# Patient Record
Sex: Female | Born: 1965 | Race: White | Hispanic: No | Marital: Married | Smoking: Former smoker
Health system: Southern US, Academic
[De-identification: ages and names within clinical notes are randomized; demographics above are authoritative.]

## PROBLEM LIST (undated history)

## (undated) ENCOUNTER — Encounter (HOSPITAL_COMMUNITY): Admission: RE | Payer: Self-pay | Source: Ambulatory Visit

## (undated) ENCOUNTER — Ambulatory Visit (HOSPITAL_COMMUNITY): Admission: RE | Payer: 59 | Source: Ambulatory Visit | Admitting: Podiatrist

## (undated) DIAGNOSIS — M109 Gout, unspecified: Secondary | ICD-10-CM

## (undated) DIAGNOSIS — R519 Headache, unspecified: Secondary | ICD-10-CM

## (undated) DIAGNOSIS — K5900 Constipation, unspecified: Secondary | ICD-10-CM

## (undated) DIAGNOSIS — Z973 Presence of spectacles and contact lenses: Secondary | ICD-10-CM

## (undated) DIAGNOSIS — Z9989 Dependence on other enabling machines and devices: Secondary | ICD-10-CM

## (undated) DIAGNOSIS — E785 Hyperlipidemia, unspecified: Secondary | ICD-10-CM

## (undated) DIAGNOSIS — M539 Dorsopathy, unspecified: Secondary | ICD-10-CM

## (undated) DIAGNOSIS — F419 Anxiety disorder, unspecified: Secondary | ICD-10-CM

## (undated) DIAGNOSIS — E079 Disorder of thyroid, unspecified: Secondary | ICD-10-CM

## (undated) DIAGNOSIS — R1310 Dysphagia, unspecified: Secondary | ICD-10-CM

## (undated) DIAGNOSIS — E039 Hypothyroidism, unspecified: Secondary | ICD-10-CM

## (undated) DIAGNOSIS — R112 Nausea with vomiting, unspecified: Secondary | ICD-10-CM

## (undated) DIAGNOSIS — I739 Peripheral vascular disease, unspecified: Secondary | ICD-10-CM

## (undated) DIAGNOSIS — M797 Fibromyalgia: Secondary | ICD-10-CM

## (undated) DIAGNOSIS — G43909 Migraine, unspecified, not intractable, without status migrainosus: Secondary | ICD-10-CM

## (undated) DIAGNOSIS — E119 Type 2 diabetes mellitus without complications: Secondary | ICD-10-CM

## (undated) DIAGNOSIS — M1990 Unspecified osteoarthritis, unspecified site: Secondary | ICD-10-CM

## (undated) DIAGNOSIS — I10 Essential (primary) hypertension: Secondary | ICD-10-CM

## (undated) DIAGNOSIS — Z9889 Other specified postprocedural states: Secondary | ICD-10-CM

## (undated) DIAGNOSIS — I729 Aneurysm of unspecified site: Secondary | ICD-10-CM

## (undated) DIAGNOSIS — K769 Liver disease, unspecified: Secondary | ICD-10-CM

## (undated) DIAGNOSIS — R6889 Other general symptoms and signs: Secondary | ICD-10-CM

## (undated) DIAGNOSIS — G629 Polyneuropathy, unspecified: Secondary | ICD-10-CM

## (undated) DIAGNOSIS — R7303 Prediabetes: Secondary | ICD-10-CM

## (undated) HISTORY — PX: HX HIP REPLACEMENT: SHX124

## (undated) HISTORY — PX: LEG SURGERY: SHX1003

## (undated) HISTORY — PX: COLONOSCOPY: SHX174

## (undated) HISTORY — PX: LAMINECTOMY: SHX219

## (undated) HISTORY — PX: ANKLE SURGERY: SHX546

## (undated) HISTORY — PX: HX UPPER ENDOSCOPY: 2100001144

## (undated) HISTORY — PX: HX FOOT SURGERY: 2100001154

## (undated) HISTORY — PX: HX BACK SURGERY: SHX140

## (undated) HISTORY — PX: HYSTEROTOMY: SHX1776

## (undated) HISTORY — PX: BLADDER SURGERY: SHX569

## (undated) HISTORY — PX: HX HYSTERECTOMY: SHX81

## (undated) SURGERY — ARTHROPLASTY HAMMERTOE CORRECTION
Anesthesia: General | Site: Foot | Laterality: Left

---

## 1995-10-02 DIAGNOSIS — G629 Polyneuropathy, unspecified: Secondary | ICD-10-CM | POA: Insufficient documentation

## 2011-01-21 DIAGNOSIS — D509 Iron deficiency anemia, unspecified: Secondary | ICD-10-CM | POA: Insufficient documentation

## 2011-01-21 DIAGNOSIS — E039 Hypothyroidism, unspecified: Secondary | ICD-10-CM | POA: Insufficient documentation

## 2011-01-21 DIAGNOSIS — I10 Essential (primary) hypertension: Secondary | ICD-10-CM | POA: Insufficient documentation

## 2012-02-17 DIAGNOSIS — L259 Unspecified contact dermatitis, unspecified cause: Secondary | ICD-10-CM | POA: Insufficient documentation

## 2013-04-25 DIAGNOSIS — M109 Gout, unspecified: Secondary | ICD-10-CM | POA: Insufficient documentation

## 2013-04-25 DIAGNOSIS — F32A Depression, unspecified: Secondary | ICD-10-CM | POA: Insufficient documentation

## 2013-04-25 HISTORY — DX: Gout, unspecified: M10.9

## 2013-05-30 DIAGNOSIS — R519 Headache, unspecified: Secondary | ICD-10-CM | POA: Insufficient documentation

## 2013-07-19 DIAGNOSIS — M546 Pain in thoracic spine: Secondary | ICD-10-CM | POA: Insufficient documentation

## 2013-10-10 DIAGNOSIS — R110 Nausea: Secondary | ICD-10-CM | POA: Insufficient documentation

## 2013-12-23 DIAGNOSIS — M4120 Other idiopathic scoliosis, site unspecified: Secondary | ICD-10-CM | POA: Insufficient documentation

## 2013-12-23 DIAGNOSIS — M25519 Pain in unspecified shoulder: Secondary | ICD-10-CM | POA: Insufficient documentation

## 2013-12-23 DIAGNOSIS — M7062 Trochanteric bursitis, left hip: Secondary | ICD-10-CM | POA: Insufficient documentation

## 2013-12-23 DIAGNOSIS — M940 Chondrocostal junction syndrome [Tietze]: Secondary | ICD-10-CM | POA: Insufficient documentation

## 2014-02-17 DIAGNOSIS — L732 Hidradenitis suppurativa: Secondary | ICD-10-CM | POA: Insufficient documentation

## 2014-03-02 DIAGNOSIS — K819 Cholecystitis, unspecified: Secondary | ICD-10-CM | POA: Insufficient documentation

## 2014-05-30 DIAGNOSIS — G5600 Carpal tunnel syndrome, unspecified upper limb: Secondary | ICD-10-CM | POA: Insufficient documentation

## 2014-08-10 ENCOUNTER — Ambulatory Visit (HOSPITAL_BASED_OUTPATIENT_CLINIC_OR_DEPARTMENT_OTHER): Payer: No Typology Code available for payment source | Admitting: ORTHOPEDIC, SPORTS MEDICINE

## 2014-08-14 ENCOUNTER — Ambulatory Visit (HOSPITAL_BASED_OUTPATIENT_CLINIC_OR_DEPARTMENT_OTHER): Payer: No Typology Code available for payment source | Admitting: ORTHOPEDIC, SPORTS MEDICINE

## 2014-08-20 DIAGNOSIS — J189 Pneumonia, unspecified organism: Secondary | ICD-10-CM | POA: Insufficient documentation

## 2014-08-20 DIAGNOSIS — J309 Allergic rhinitis, unspecified: Secondary | ICD-10-CM | POA: Insufficient documentation

## 2014-08-30 ENCOUNTER — Ambulatory Visit (HOSPITAL_BASED_OUTPATIENT_CLINIC_OR_DEPARTMENT_OTHER): Payer: No Typology Code available for payment source | Admitting: Physician Assistant

## 2014-08-30 ENCOUNTER — Ambulatory Visit
Admission: RE | Admit: 2014-08-30 | Discharge: 2014-08-30 | Disposition: A | Discharge status: Home / Self Care | Payer: No Typology Code available for payment source | Source: Ambulatory Visit | Attending: ORTHOPEDIC, SPORTS MEDICINE | Admitting: ORTHOPEDIC, SPORTS MEDICINE

## 2014-08-30 VITALS — BP 149/79 | HR 79 | Temp 98.1°F | Ht 64.0 in | Wt 186.5 lb

## 2014-08-30 DIAGNOSIS — E559 Vitamin D deficiency, unspecified: Secondary | ICD-10-CM | POA: Insufficient documentation

## 2014-08-30 DIAGNOSIS — M810 Age-related osteoporosis without current pathological fracture: Secondary | ICD-10-CM | POA: Insufficient documentation

## 2014-08-30 DIAGNOSIS — Z87891 Personal history of nicotine dependence: Secondary | ICD-10-CM | POA: Insufficient documentation

## 2014-08-30 DIAGNOSIS — E039 Hypothyroidism, unspecified: Secondary | ICD-10-CM | POA: Insufficient documentation

## 2014-08-30 DIAGNOSIS — M24511 Contracture, right shoulder: Secondary | ICD-10-CM | POA: Insufficient documentation

## 2014-08-30 DIAGNOSIS — I10 Essential (primary) hypertension: Secondary | ICD-10-CM | POA: Insufficient documentation

## 2014-08-30 DIAGNOSIS — M19011 Primary osteoarthritis, right shoulder: Secondary | ICD-10-CM

## 2014-08-30 DIAGNOSIS — R52 Pain, unspecified: Secondary | ICD-10-CM

## 2014-08-30 DIAGNOSIS — G5600 Carpal tunnel syndrome, unspecified upper limb: Secondary | ICD-10-CM | POA: Insufficient documentation

## 2014-08-30 DIAGNOSIS — M797 Fibromyalgia: Secondary | ICD-10-CM | POA: Insufficient documentation

## 2014-08-30 DIAGNOSIS — Z8669 Personal history of other diseases of the nervous system and sense organs: Secondary | ICD-10-CM | POA: Insufficient documentation

## 2014-08-30 DIAGNOSIS — M25811 Other specified joint disorders, right shoulder: Secondary | ICD-10-CM | POA: Insufficient documentation

## 2014-08-31 ENCOUNTER — Ambulatory Visit (HOSPITAL_BASED_OUTPATIENT_CLINIC_OR_DEPARTMENT_OTHER): Payer: No Typology Code available for payment source | Admitting: ORTHOPEDIC, SPORTS MEDICINE

## 2014-08-31 NOTE — Progress Notes (Addendum)
[facility]                              DEPARTMENT OF [facility]                                [place], [place] [id number]                                PATIENT NAME: Courtney CockayneONTORNO, [facility] VHQION:[id number]:[id number]  DATE OF SERVICE:08/30/2014  DATE OF BIRTH: 17-Apr-1966    PROGRESS NOTE    SUBJECTIVE:  Courtney StanleyLisa is a 49 year old, right-hand dominant female who presented to clinic today now for evaluation of her right shoulder pain.  Apparently she started having pain about 2-1/2 years ago but does not recall a specific injury.  Since then she has been experiencing pain on the posterolateral and anterior aspect of her shoulder.  She reports it is constant and it seems to be worse at night.  It increases with reaching.  She will also experience pain in her forearm and into her hand at times.  She has known carpal tunnel syndrome though.  The pain that she experiences is in the dorsal aspect of her hand.  She has had 3 previous injections.  The first 2 helped for a short time frame.  The last one did not help at all.  She has not had any formalized physical therapy, just some manipulations performed by a DO.  She takes Flexeril as necessary for pain.    PAST MEDICAL HISTORY:  Hypertension, fibromyalgia, osteoporosis, hypothyroidism, vitamin D deficiency, history of Guillain-Barre.    PAST SURGICAL HISTORY:  History is right ACL reconstruction, cesarean section, hysterectomy, tonsillectomy, colonoscopy, abdominal laparoscopy, bladder tuck, 3 left leg surgeries with skin grafting and lower back surgery.  No significant anesthesia complications.      CURRENT MEDICATIONS: Include:  1.  Allopurinol 100 mg daily.    2.  BuSpar 150 mg daily.  3.  Calcium 500 mg daily.  4.  Cetirizine 10 mg daily.  5.  Estradiol 0.5 mg daily.  6.  Levothyroxine 50 mcg daily.  7.  Losartan 50 mg one-half tablet daily.  8.  Metronidazole gel.  9.  Nasonex p.r.n.  10.  Nystatin powder.  11.   Oxybutynin ER 5 mg daily.  12.  Pantoprazole 40 mg daily.    ALLERGIES:  Morphine.    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  She quit smoking in 2008.  Denies any use of alcohol.      FAMILY HISTORY:  Diabetes, hypertension, hyperlipidemia, stroke, heart attack, hypothyroidism, acid reflux, heart failure, fibromyalgia, rheumatoid arthritis, gout, sleep apnea, and osteoporosis.    REVIEW OF SYSTEMS:  She has noticed some weight gain as well as fatigue.  She has a runny nose.  She has problems with blurry vision.  She has been experiencing multiple joint pains including her left hip and other joints.  She reports swelling of her joints as well but all other systems are negative except for the shoulder.    PHYSICAL EXAMINATION:  Today [name] is a pleasant female in no acute distress.  Appropriate mood and affect.  Blood pressure is 149/79, pulse 79, temperature 36.7 degrees Celsius.  She is 84.6 kg and 5  feet 4 inches.  She is well appearing.  Her sclerae are nonicteric.  Breathing is nonlabored.  Abdomen is nondistended.  On evaluation of the right upper extremity, she has forward flexion to 180, abduction to 90, external rotation to 45, internal rotation to the SI joint.  She had decreased internal rotation compared to the contralateral side.  She was tender to palpation anteriorly and also posteriorly.  No AC joint tenderness.  Her rotator cuff strength is 5/5 in internal and external rotation.  She was neurovascularly intact distally.  No skin rashes over the upper extremity.    IMAGING:  Radiographs in the PACS system demonstrate mild glenohumeral osteoarthritis.  MRI on [facility] demonstrates a partial-thickness rotator cuff tear and degenerative change noted.     ASSESSMENT:  1.  Right shoulder posterior capsule contracture.       PLAN:  I had a discussion with Ms. Winch concerning treatment options for her shoulder.  At this point, I would recommend trying formalized physical  therapy working on posterior capsule stretching and modalities for pain.  She is currently having a workup for a possible autoimmune disorder; so she will continue doing that with her primary care physician.  We will make a followup appointment in 6 weeks.  If she is doing well, she can call and cancel the appointment.  However, if she is still having pain, we recommend that she return.      This patient was seen independently.      Leanna Battles, PA-C  [place] Department of Orthopaedics    Maggie Font, MD  Assistant Professor  [facility] Department of Orthopaedics    ZO/XW/[id number]; D: 08/30/2014 12:41:51; T: 08/31/2014 04:20:56

## 2014-10-09 ENCOUNTER — Ambulatory Visit (HOSPITAL_BASED_OUTPATIENT_CLINIC_OR_DEPARTMENT_OTHER): Payer: No Typology Code available for payment source | Admitting: ORTHOPEDIC, SPORTS MEDICINE

## 2014-10-09 DIAGNOSIS — K229 Disease of esophagus, unspecified: Secondary | ICD-10-CM | POA: Insufficient documentation

## 2014-10-09 DIAGNOSIS — Z780 Asymptomatic menopausal state: Secondary | ICD-10-CM | POA: Insufficient documentation

## 2014-10-09 DIAGNOSIS — M7540 Impingement syndrome of unspecified shoulder: Secondary | ICD-10-CM | POA: Insufficient documentation

## 2015-02-02 ENCOUNTER — Other Ambulatory Visit (HOSPITAL_COMMUNITY): Payer: Self-pay | Admitting: Orthopaedic Surgery

## 2015-08-12 ENCOUNTER — Other Ambulatory Visit: Payer: Self-pay

## 2016-06-29 ENCOUNTER — Other Ambulatory Visit: Payer: Self-pay

## 2016-07-29 ENCOUNTER — Ambulatory Visit (INDEPENDENT_AMBULATORY_CARE_PROVIDER_SITE_OTHER): Payer: Self-pay | Admitting: Neurological Surgery

## 2016-12-04 ENCOUNTER — Other Ambulatory Visit (INDEPENDENT_AMBULATORY_CARE_PROVIDER_SITE_OTHER): Payer: Self-pay | Admitting: Neurology

## 2016-12-04 ENCOUNTER — Ambulatory Visit (INDEPENDENT_AMBULATORY_CARE_PROVIDER_SITE_OTHER): Payer: No Typology Code available for payment source | Admitting: Neurology

## 2016-12-04 ENCOUNTER — Ambulatory Visit (INDEPENDENT_AMBULATORY_CARE_PROVIDER_SITE_OTHER): Payer: Self-pay | Admitting: Neurology

## 2016-12-04 ENCOUNTER — Ambulatory Visit: Payer: No Typology Code available for payment source

## 2016-12-04 DIAGNOSIS — E119 Type 2 diabetes mellitus without complications: Secondary | ICD-10-CM

## 2016-12-04 DIAGNOSIS — R202 Paresthesia of skin: Secondary | ICD-10-CM

## 2016-12-04 DIAGNOSIS — M545 Low back pain, unspecified: Secondary | ICD-10-CM

## 2016-12-04 DIAGNOSIS — G629 Polyneuropathy, unspecified: Secondary | ICD-10-CM

## 2016-12-04 DIAGNOSIS — M4310 Spondylolisthesis, site unspecified: Secondary | ICD-10-CM

## 2016-12-16 ENCOUNTER — Other Ambulatory Visit (HOSPITAL_COMMUNITY): Payer: Self-pay

## 2016-12-22 ENCOUNTER — Ambulatory Visit
Admission: RE | Admit: 2016-12-22 | Discharge: 2016-12-22 | Disposition: A | Discharge status: Home / Self Care | Payer: No Typology Code available for payment source | Source: Ambulatory Visit | Attending: Nuclear Radiology | Admitting: Nuclear Radiology

## 2016-12-22 ENCOUNTER — Ambulatory Visit (HOSPITAL_COMMUNITY): Payer: No Typology Code available for payment source | Admitting: Nuclear Radiology

## 2016-12-22 ENCOUNTER — Encounter (HOSPITAL_COMMUNITY)
Admission: RE | Disposition: A | Discharge status: Home / Self Care | Payer: Self-pay | Source: Ambulatory Visit | Attending: Nuclear Radiology

## 2016-12-22 DIAGNOSIS — Z981 Arthrodesis status: Secondary | ICD-10-CM | POA: Insufficient documentation

## 2016-12-22 DIAGNOSIS — M545 Low back pain: Secondary | ICD-10-CM | POA: Insufficient documentation

## 2016-12-22 DIAGNOSIS — M79652 Pain in left thigh: Secondary | ICD-10-CM | POA: Insufficient documentation

## 2016-12-22 DIAGNOSIS — M4317 Spondylolisthesis, lumbosacral region: Secondary | ICD-10-CM | POA: Insufficient documentation

## 2016-12-22 DIAGNOSIS — M4316 Spondylolisthesis, lumbar region: Secondary | ICD-10-CM

## 2016-12-22 DIAGNOSIS — M791 Myalgia: Secondary | ICD-10-CM | POA: Insufficient documentation

## 2016-12-22 DIAGNOSIS — G8929 Other chronic pain: Secondary | ICD-10-CM | POA: Insufficient documentation

## 2016-12-22 DIAGNOSIS — M79651 Pain in right thigh: Secondary | ICD-10-CM | POA: Insufficient documentation

## 2016-12-22 SURGERY — IR NERVE BLOCK LUMBAR
Laterality: Right

## 2016-12-22 MED ORDER — DEXAMETHASONE SODIUM PHOSPHATE (PF) 10 MG/ML INJECTION SOLUTION
12.0000 mg | Freq: Once | INTRAMUSCULAR | Status: AC
Start: 2016-12-22 — End: 2016-12-22

## 2016-12-22 MED ORDER — ROPIVACAINE (PF) 2 MG/ML (0.2 %) INJECTION SOLUTION
3.0000 mL | Freq: Once | INTRAMUSCULAR | Status: AC
Start: 2016-12-22 — End: 2016-12-22
  Administered 2016-12-22: 3 mL via EPIDURAL

## 2016-12-22 MED ORDER — LIDOCAINE HCL 20 MG/ML (2 %) INJECTION SOLUTION
INTRAMUSCULAR | Status: AC
Start: 2016-12-22 — End: 2016-12-22
  Filled 2016-12-22: qty 20

## 2016-12-22 MED ORDER — DEXAMETHASONE SODIUM PHOSPHATE (PF) 10 MG/ML INJECTION SOLUTION
INTRAMUSCULAR | Status: AC
Start: 2016-12-22 — End: 2016-12-22
  Filled 2016-12-22: qty 1

## 2016-12-22 MED ORDER — ROPIVACAINE (PF) 2 MG/ML (0.2 %) INJECTION SOLUTION
INTRAMUSCULAR | Status: AC
Start: 2016-12-22 — End: 2016-12-22
  Filled 2016-12-22: qty 10

## 2016-12-22 MED ORDER — LIDOCAINE HCL 20 MG/ML (2 %) INJECTION SOLUTION
8.00 mL | INTRAMUSCULAR | Status: AC
Start: 2016-12-22 — End: 2016-12-22
  Administered 2016-12-22: 160 mg via INTRAMUSCULAR

## 2016-12-22 MED ORDER — IOPAMIDOL 200 MG IODINE/ML (41 %) INTRATHECAL SOLUTION
7.00 mL | INTRATHECAL | Status: AC
Start: 2016-12-22 — End: 2016-12-22
  Administered 2016-12-22: 12:00:00 7 mL via EPIDURAL

## 2016-12-22 MED ADMIN — sennosides 8.6 mg-docusate sodium 50 mg tablet: EPIDURAL | @ 12:00:00

## 2016-12-22 SURGICAL SUPPLY — 4 items
NEEDLE BIOPSY 22GA 15CM CHIBA STRL DISP ASP (NEEDLES & SYRINGE SUPPLIES) ×1 IMPLANT
NEEDLE BIOPSY 22GA 15CM CHIBA_STRL DISP ASP (NEEDLES & SYRINGE SUPPLIES) ×1
NEEDLE SPINAL BLK 5IN 22GA QUINCKE LONG LGTH REG WL POLYPROP STRL LF  DISP (NEEDLES & SYRINGE SUPPLIES) ×1 IMPLANT
NEEDLE SPINAL BLK 5IN 22GA QUI_NCKE LONG LGTH REG WL POLYPROP (NEEDLES & SYRINGE SUPPLIES) ×1

## 2017-01-15 ENCOUNTER — Other Ambulatory Visit (INDEPENDENT_AMBULATORY_CARE_PROVIDER_SITE_OTHER): Payer: Self-pay | Admitting: Orthopaedic Surgery of the Spine

## 2017-01-15 ENCOUNTER — Ambulatory Visit (HOSPITAL_BASED_OUTPATIENT_CLINIC_OR_DEPARTMENT_OTHER): Payer: No Typology Code available for payment source

## 2017-01-15 ENCOUNTER — Encounter (INDEPENDENT_AMBULATORY_CARE_PROVIDER_SITE_OTHER): Payer: Self-pay | Admitting: Orthopaedic Surgery of the Spine

## 2017-01-15 ENCOUNTER — Ambulatory Visit
Payer: No Typology Code available for payment source | Attending: Orthopaedic Surgery of the Spine | Admitting: Orthopaedic Surgery of the Spine

## 2017-01-15 VITALS — BP 152/83 | HR 87 | Temp 97.4°F | Ht 63.75 in | Wt 185.6 lb

## 2017-01-15 DIAGNOSIS — M4310 Spondylolisthesis, site unspecified: Secondary | ICD-10-CM | POA: Insufficient documentation

## 2017-01-15 DIAGNOSIS — R200 Anesthesia of skin: Secondary | ICD-10-CM

## 2017-01-15 DIAGNOSIS — M549 Dorsalgia, unspecified: Secondary | ICD-10-CM

## 2017-01-15 DIAGNOSIS — K219 Gastro-esophageal reflux disease without esophagitis: Secondary | ICD-10-CM | POA: Insufficient documentation

## 2017-01-15 DIAGNOSIS — Z79890 Hormone replacement therapy (postmenopausal): Secondary | ICD-10-CM | POA: Insufficient documentation

## 2017-01-15 DIAGNOSIS — Z981 Arthrodesis status: Secondary | ICD-10-CM

## 2017-01-15 DIAGNOSIS — M5416 Radiculopathy, lumbar region: Secondary | ICD-10-CM

## 2017-01-15 DIAGNOSIS — Z7984 Long term (current) use of oral hypoglycemic drugs: Secondary | ICD-10-CM | POA: Insufficient documentation

## 2017-01-15 DIAGNOSIS — Z96649 Presence of unspecified artificial hip joint: Secondary | ICD-10-CM | POA: Insufficient documentation

## 2017-01-15 DIAGNOSIS — M545 Low back pain, unspecified: Secondary | ICD-10-CM

## 2017-01-15 DIAGNOSIS — Z885 Allergy status to narcotic agent status: Secondary | ICD-10-CM | POA: Insufficient documentation

## 2017-01-15 DIAGNOSIS — N3281 Overactive bladder: Secondary | ICD-10-CM | POA: Insufficient documentation

## 2017-01-15 DIAGNOSIS — G610 Guillain-Barre syndrome: Secondary | ICD-10-CM | POA: Insufficient documentation

## 2017-01-15 DIAGNOSIS — R202 Paresthesia of skin: Secondary | ICD-10-CM | POA: Insufficient documentation

## 2017-01-15 DIAGNOSIS — M48061 Spinal stenosis, lumbar region without neurogenic claudication: Principal | ICD-10-CM | POA: Insufficient documentation

## 2017-01-15 DIAGNOSIS — I10 Essential (primary) hypertension: Secondary | ICD-10-CM | POA: Insufficient documentation

## 2017-01-15 DIAGNOSIS — Z79899 Other long term (current) drug therapy: Secondary | ICD-10-CM | POA: Insufficient documentation

## 2017-01-15 DIAGNOSIS — Z888 Allergy status to other drugs, medicaments and biological substances status: Secondary | ICD-10-CM | POA: Insufficient documentation

## 2017-01-15 DIAGNOSIS — E039 Hypothyroidism, unspecified: Secondary | ICD-10-CM | POA: Insufficient documentation

## 2017-01-15 DIAGNOSIS — M4317 Spondylolisthesis, lumbosacral region: Secondary | ICD-10-CM

## 2017-01-15 DIAGNOSIS — Z96643 Presence of artificial hip joint, bilateral: Secondary | ICD-10-CM | POA: Insufficient documentation

## 2017-01-15 DIAGNOSIS — N3946 Mixed incontinence: Secondary | ICD-10-CM | POA: Insufficient documentation

## 2017-01-15 DIAGNOSIS — M797 Fibromyalgia: Secondary | ICD-10-CM | POA: Insufficient documentation

## 2017-01-15 DIAGNOSIS — R7303 Prediabetes: Secondary | ICD-10-CM | POA: Insufficient documentation

## 2017-01-15 DIAGNOSIS — G4730 Sleep apnea, unspecified: Secondary | ICD-10-CM | POA: Insufficient documentation

## 2017-01-15 DIAGNOSIS — Z87891 Personal history of nicotine dependence: Secondary | ICD-10-CM | POA: Insufficient documentation

## 2017-01-15 DIAGNOSIS — G43909 Migraine, unspecified, not intractable, without status migrainosus: Secondary | ICD-10-CM | POA: Insufficient documentation

## 2017-01-15 MED ORDER — GABAPENTIN 300 MG CAPSULE: 300 mg | Cap | Freq: Three times a day (TID) | ORAL | 3 refills | 0 days | Status: DC

## 2017-01-16 NOTE — H&P (Signed)
PATIENT NAME: Courtney [name], Courtney [name] [facility] NUMBER:  [id number]  DATE OF SERVICE: 01/15/2017  DATE OF BIRTH:  1966/02/27    HISTORY AND PHYSICAL    CHIEF COMPLAINT:  Low back pain with radiation down the bilateral right-greater-than-left lower extremities since August 2017.    SUBJECTIVE:  The patient is a 51 year old Caucasian female accompanied by her husband to clinic today for the above-mentioned complaint.  In 1985, the patient had a lumbar fusion done.  She cannot completely recall the levels, although she remembers hearing about the L4-L5 vertebrae.  Following the surgery she always has had some degree of backache; however, it did not inhibit her from completing her activities of daily living in.  In August 2017, with no known injury, she had awakened with increasing low back pain with radiation down the posterior bilateral buttocks, thighs and calves and into the posterior ankles and heels.  She describes the pain as a dull throb and with activity it becomes more of a shock-like sensation.  It is more severe on the right than the left.  She states that the right-sided pain is the same as it had been prior to the surgery.  Before the surgery, she had no history of the left leg pain.  She has failed physical therapy that she had done from December 2017 into January 2018 with no significant relief.  She has had a right L5 nerve root block that did help with her pain for approximately 48 hours.  After this time period, the pain had returned full swing.  She is limited in her ability to walk distances because of the back pain and leg symptoms; however, symptoms improve with sitting down and rest.  She denies any incontinence of bowels.  She has some stress and urge incontinence of her urine.  She states that she has to take frequent breaks when trying to complete her housework.  She denies any issues with balance or fine motor skills.  She is in the prediabetes range and takes metformin and has hypertension  and hyperlipidemia controlled with medication.    PAST SURGICAL HISTORY:  The lumbar fusion in 1985 by Dr. Rosilyn Mings in [facility].    PAST SURGICAL HISTORY:  She had a hip replacement 2016 and later a I and D from a group B strep infection.  She had a bladder reconstructive surgery that had been revised with the initial surgery in 1996; revision in 2014, 2015; right ankle reconstruction; hysterectomy and a C-section 1995.  Two other C-sections with an infection from 59-1992.  She has also had a skin graft of the right calf done in 1984.    PAST MEDICAL HISTORY:  Significant for Guillain-Barre syndrome, rotator cuff tear in the right shoulder, bilateral carpal tunnel, splenic artery aneurysm, GERD, bladder hyperactivity, prediabetes, hypertension, sleep apnea, migraine headaches, eczema, hypothyroidism, fibromyalgia, costochondritis and kyphoscoliosis.    MEDICATIONS:  1. Losartan.  2. Metformin.  3. Levothyroxine.  4. Estradiol.  5. Bupropion.  6. Oxybutynin.  7. Pantoprazole.  8. Gemfibrozil.  9. Fluocinolone cream.  10. Allopurinol.    ALLERGIES:  MORPHINE, which is facial swelling; MACROBID, asthma, shortness of breath; and STATINS cause muscle pain.  Denies latex allergy.    SOCIAL HISTORY:  She is currently employed as a Diplomatic Services operational officer.  She is a former smoker who quit in 2008 and admits to occasional consumption of alcohol.    FAMILY HISTORY:  Both parents and sisters have back problems, scoliosis.  The patient's mother and  father have history of diabetes and stroke.  Father has a history of Hodgkin lymphoma and hairy cell leukemia.    PSYCHOSOCIAL:  In the past 2 weeks the patient admits to feeling down blue or depressed and losing interest in things she has cared about.  The patient has 5/10 feels the pain is terrible and will never get better; 4/10 feels that damage is being done; 5/10 feels that when she is in pain she is afraid something terrible will happen.    REVIEW OF SYSTEMS:  The patient  admits to occasional fevers and chills, difficulty starting to urinate, urinary incontinence, urinary frequency, bleeding or burning upon urination, constipation, blurred vision, chest pain and fluttering, swelling around the ankles and calves, shortness of breath with exertion, joint pain, muscle stiffness, red itchy rash on her neck and inside of her elbows, numbness in all of her toes, changes in mood and allergies.  All other systems noncontributory.  Please refer to scanned in sheet.    OBJECTIVE:  Vital signs:  Blood pressure 152/83, pulse of 87, temperature of 37.4 degrees Celsius,  weight 84.2 kg and height of 5 feet and 3.75 inches.  General:  Well-nourished, well-developed and in no acute distress.  Alert and oriented.  Psych:  Appropriate mood and affect for clinic situation.  HEENT:  Normocephalic, atraumatic.  Sclerae nonicteric.  Extraocular movements intact.  Neck:  Supple.  Mucous membranes moist.  Cardiovascular:  2+ palpable pedal pulses.  Skin:  Warm and well perfused.  Abdomen:  Obese, nondistended.  Pulmonary:  Respirations nonlabored without audible wheeze.  No cyanosis or clubbing of extremities.  There is minor swelling of the bilateral ankles, although not pitting edema.  No accessory muscle use on room air.  Musculoskeletal:  The patient has a normal gait pattern and is able to ambulate across the room without difficulty as well as complete heel, toe and tandem gait.  She has 5/5 bilateral lower extremities for TA, EHL, GS, Q and HF, adduction of the left hip.  Sensation is intact and equal dermatomes L2 through S1.  The patient has 1+ bilateral patella and Achilles reflexes.  No clonus at the ankles but downgoing Babinski.  For the left hip exam, there is pain on internal or external rotation of the hip at the extremes.  This is negative on the right.  Negative straight leg raise bilaterally.  She is not tender to palpation throughout the lumbar spine.  She has pain on extension in the low  back that is relieved with flexion.  No pain or cord with lateral bending.  On single leg toe raises, she has a more difficult time on the right than the left and there does appear to be mildly decreased calf contour and with heel walk, she has a harder time with the right than the left but she is able to complete.    IMAGING:  The patient had lumbar films obtained and reviewed in clinic alongside Dr. 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  There does appear to be a visible fusion mass from the L4-S1 level.  No acute fracture appreciated.  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  No instability on flexion or extension.  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  No significant central stenosis appreciated throughout the  remaining lumbar spine as well as foraminal stenosis.    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    PLAN:  I discussed with the patient the findings on x-ray as well as exam.  To further evaluate her fusion, we would like to obtain a lumbar CT.  For the patient's radicular symptoms as well as the neurogenic claudication symptoms, we would like to start her on gabapentin 300 mg t.i.d.  She was instructed on titrating this up to 300 mg 3 times daily.  Note:  She has had a hysterectomy and is not trying to get pregnant so there should be no problem with this potential interaction.  Since the patient has also failed physical therapy, we would like to get her an L3-L4 epidural injection.  She had a good response initially to L5 radiculopathy.  We suspect that her pain in the posterior legs is more related to the stenosis and the claudication symptoms.  The patient can call us in several weeks after the injection and  CT to let us know how she is doing, and we will determine treatment plan from there.  All imaging was reviewed with the patient and she and her husband were agreeable and will contact us in the meantime for questions or concerns.        Adolm JosephJosee L Zydonik, PA-C    I personally saw and examined the patient. See physician's assistant note for additional details. 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  Will try an ESI.  Arta SilenceSanford Hera Celaya, MD         Arta SilenceSanford Uzma Hellmer, MD  Professor and Chair   [place] Department of Orthopaedics               CC:   Amy Collene LeydenM Goff, DO   [facility]   [address]   [place], [place] [id number]   Fax: [phone]     Melina ModenaJohn [name], MD       DD:  01/15/2017 11:58:49  DT:  01/15/2017 17:00:16 JB  D#:  [id number]

## 2017-01-26 ENCOUNTER — Ambulatory Visit
Admission: RE | Admit: 2017-01-26 | Discharge: 2017-01-26 | Disposition: A | Discharge status: Home / Self Care | Payer: No Typology Code available for payment source | Source: Ambulatory Visit | Attending: Nuclear Radiology | Admitting: Nuclear Radiology

## 2017-01-26 ENCOUNTER — Ambulatory Visit (HOSPITAL_COMMUNITY): Payer: No Typology Code available for payment source | Admitting: Nuclear Radiology

## 2017-01-26 ENCOUNTER — Encounter (HOSPITAL_COMMUNITY)
Admission: RE | Disposition: A | Discharge status: Home / Self Care | Payer: Self-pay | Source: Ambulatory Visit | Attending: Nuclear Radiology

## 2017-01-26 ENCOUNTER — Ambulatory Visit (HOSPITAL_BASED_OUTPATIENT_CLINIC_OR_DEPARTMENT_OTHER): Payer: No Typology Code available for payment source

## 2017-01-26 DIAGNOSIS — M545 Low back pain, unspecified: Secondary | ICD-10-CM | POA: Insufficient documentation

## 2017-01-26 DIAGNOSIS — M48061 Spinal stenosis, lumbar region without neurogenic claudication: Secondary | ICD-10-CM

## 2017-01-26 DIAGNOSIS — M4310 Spondylolisthesis, site unspecified: Secondary | ICD-10-CM | POA: Insufficient documentation

## 2017-01-26 DIAGNOSIS — Z981 Arthrodesis status: Secondary | ICD-10-CM

## 2017-01-26 DIAGNOSIS — M5416 Radiculopathy, lumbar region: Secondary | ICD-10-CM | POA: Insufficient documentation

## 2017-01-26 DIAGNOSIS — M4317 Spondylolisthesis, lumbosacral region: Secondary | ICD-10-CM | POA: Insufficient documentation

## 2017-01-26 DIAGNOSIS — R202 Paresthesia of skin: Secondary | ICD-10-CM

## 2017-01-26 DIAGNOSIS — M47816 Spondylosis without myelopathy or radiculopathy, lumbar region: Secondary | ICD-10-CM | POA: Insufficient documentation

## 2017-01-26 DIAGNOSIS — M4807 Spinal stenosis, lumbosacral region: Secondary | ICD-10-CM | POA: Insufficient documentation

## 2017-01-26 DIAGNOSIS — G8929 Other chronic pain: Secondary | ICD-10-CM | POA: Insufficient documentation

## 2017-01-26 DIAGNOSIS — R200 Anesthesia of skin: Secondary | ICD-10-CM

## 2017-01-26 SURGERY — IR EPIDURAL STEROID INJECTION LUMBAR

## 2017-01-26 MED ORDER — IOPAMIDOL 300 MG IODINE/ML (61 %) INTRAVENOUS SOLUTION
2.00 mL | INTRAVENOUS | Status: DC
Start: 2017-01-26 — End: 2017-01-26

## 2017-01-26 MED ORDER — IOPAMIDOL 200 MG IODINE/ML (41 %) INTRATHECAL SOLUTION
2.00 mL | INTRATHECAL | Status: AC
Start: 2017-01-26 — End: 2017-01-26
  Administered 2017-01-26: 13:00:00 2 mL via EPIDURAL

## 2017-01-26 MED ORDER — ROPIVACAINE (PF) 2 MG/ML (0.2 %) INJECTION SOLUTION
3.0000 mL | Freq: Once | INTRAMUSCULAR | Status: AC
Start: 2017-01-26 — End: 2017-01-26

## 2017-01-26 MED ORDER — TRIAMCINOLONE ACETONIDE 40 MG/ML SUSPENSION FOR INJECTION
40.0000 mg | Freq: Once | INTRAMUSCULAR | Status: DC
Start: 2017-01-26 — End: 2017-01-26

## 2017-01-26 MED ORDER — DEXAMETHASONE SODIUM PHOSPHATE (PF) 10 MG/ML INJECTION SOLUTION
INTRAMUSCULAR | Status: AC
Start: 2017-01-26 — End: 2017-01-26
  Filled 2017-01-26: qty 2

## 2017-01-26 MED ORDER — ROPIVACAINE (PF) 2 MG/ML (0.2 %) INJECTION SOLUTION
3.0000 mL | Freq: Once | INTRAMUSCULAR | Status: AC
Start: 2017-01-26 — End: 2017-01-26
  Administered 2017-01-26: 3 mL

## 2017-01-26 MED ORDER — ROPIVACAINE (PF) 5 MG/ML (0.5 %) INJECTION SOLUTION
4.0000 mL | Freq: Once | INTRAMUSCULAR | Status: DC
Start: 2017-01-26 — End: 2017-01-26

## 2017-01-26 MED ORDER — DEXAMETHASONE SODIUM PHOSPHATE (PF) 10 MG/ML INJECTION SOLUTION
16.0000 mg | Freq: Once | INTRAMUSCULAR | Status: AC
Start: 2017-01-26 — End: 2017-01-26
  Administered 2017-01-26: 16 mg via EPIDURAL

## 2017-01-26 MED ORDER — ROPIVACAINE (PF) 2 MG/ML (0.2 %) INJECTION SOLUTION
INTRAMUSCULAR | Status: AC
Start: 2017-01-26 — End: 2017-01-26
  Filled 2017-01-26: qty 10

## 2017-01-26 SURGICAL SUPPLY — 2 items
NEEDLE EPIDRL BLK 4.25IN 22GA TUOHY METAL PLASTIC BVL REM WNG SLIDE DEPTH INDICATOR STY STRL LF (NEEDLES & SYRINGE SUPPLIES) ×1 IMPLANT
NEEDLE EPIDRL BLK 4.25IN 22GA_TUOHY METAL PLASTIC BVL REM (NEEDLES & SYRINGE SUPPLIES) ×1

## 2017-01-29 ENCOUNTER — Telehealth (INDEPENDENT_AMBULATORY_CARE_PROVIDER_SITE_OTHER): Payer: Self-pay | Admitting: Orthopaedic Surgery of the Spine

## 2017-01-29 NOTE — Telephone Encounter (Signed)
Duplicate Encounter entered by error.  Durenda HurtJennifer Devani Odonnel, RN  01/29/2017, 13:57

## 2017-01-29 NOTE — Telephone Encounter (Signed)
Message  Received: Today     [name], [name], RN  EsthervilleZydonik, [name], PA-C                   [name]- Called and spoke with the patient. She got then Tuesday she felt wonderful with no pain. Then it started to return. The pain is not as bad as it was prior to the injection. She still has about 1/4 th of the pain back. She will call us back in about 2-3 week to let us know how she is doing. She said he mentioned surgery but they didn't have the big surgical Discussion. She has no needs at this time. ThanksCandise Bowens, Jen 8/2 @ 1355         Previous Messages      ----- Message -----    From: Kerby NoraFluharty, Kerry    Sent: 01/29/2017 10:38 AM     To: Ortho Dr Shea EvansEmery Service                       Message from Kerby NoraKerry Fluharty sent at 01/29/2017 10:38 AM EDT      Shea EvansEmery pt.    Patient is reporting that her INJ on 7/30 lasted for about a day, and now the pain has returned and the intensity increases throughout the day.  She reports that she is still taking the gabapentin 3 times a day.  She would like to know when she can be seen (okay to wait next available or bring in sooner) - and she also wanted to know if a telephone visit was possible since she's 4 hours away.    Thanks!         Call History         Type Contact Phone User     01/29/2017 10:31 AM Phone (Incoming) Courtney [name], Courtney [name] (Self) [phone] Lacretia Nicks(W) Fluharty, Zoila ShutterKerry      Laylia Mui, RN  01/29/2017, 13:57

## 2017-03-23 ENCOUNTER — Ambulatory Visit
Payer: No Typology Code available for payment source | Attending: Orthopaedic Surgery of the Spine | Admitting: Orthopaedic Surgery of the Spine

## 2017-03-23 ENCOUNTER — Encounter (INDEPENDENT_AMBULATORY_CARE_PROVIDER_SITE_OTHER): Payer: Self-pay | Admitting: Orthopaedic Surgery of the Spine

## 2017-03-23 DIAGNOSIS — M79605 Pain in left leg: Secondary | ICD-10-CM | POA: Insufficient documentation

## 2017-03-23 DIAGNOSIS — Z981 Arthrodesis status: Secondary | ICD-10-CM | POA: Insufficient documentation

## 2017-03-23 DIAGNOSIS — M79604 Pain in right leg: Secondary | ICD-10-CM | POA: Insufficient documentation

## 2017-03-23 DIAGNOSIS — Z96649 Presence of unspecified artificial hip joint: Secondary | ICD-10-CM | POA: Insufficient documentation

## 2017-03-23 DIAGNOSIS — M48062 Spinal stenosis, lumbar region with neurogenic claudication: Secondary | ICD-10-CM

## 2017-03-23 DIAGNOSIS — M2578 Osteophyte, vertebrae: Secondary | ICD-10-CM

## 2017-03-23 DIAGNOSIS — M4316 Spondylolisthesis, lumbar region: Secondary | ICD-10-CM

## 2017-03-23 NOTE — Progress Notes (Signed)
To be dictated  Arta Silence, MD 03/23/2017, 13:58

## 2017-03-24 NOTE — Progress Notes (Signed)
PATIENT NAME: Courtney [name], [facility] NUMBER:  [id number]  DATE OF SERVICE: 03/23/2017  DATE OF BIRTH:  [birth year]    PROGRESS NOTE    SUBJECTIVE:  I last saw Tanae on January 15, 2017.  She has had trouble with her back and bilateral right greater than left lower extremity pain since August 2017.  In 1985, she had an L4 to the sacrum fusion for high-grade spondylolisthesis done at [facility].  She had a total hip in 2016 and had an infection after that.      We had ordered an epidural steroid injection.  She had that on January 21, 2017.  It helped somewhat.  She still, however, cannot stand more than 5 or 10 minutes.  She can only walk maybe a quarter of a mile and that bothers her.  The right leg pain goes down to her thigh and sometimes down to the ankle.    OBJECTIVE:  On exam, she has limited range of motion particularly in extension.  She has no motor weakness that I can pick up.  Her straight leg raise is negative.  Range of motion of her hips gives her a little bit of back pain but no groin pain.    IMAGING:  Review of her CT scan just done is difficult to interpret.  It is hard for me to see a solid posterior fusion.  She has a lot of old mature bone graft there.  She has osteophytes anteriorly at L5-S1.  There is no stenosis at all on the MRI at L4-L5 and L5-S1.  She is quite tight at L3-L4, however.    ASSESSMENT AND PLAN:  We talked about the options here.  She has had 2 injections.  She has done physical therapy.  She is really sick of this.  I think she is a candidate to extend her old fusion up 1 level.  I would hook into 3 and then   probably hook into 4 in the sacrum.  She is going to think about all this and get back to [place] if she wants something done.        Arta Silence, MD  Professor and Chair   [place] Department of Orthopaedics               CC:   Melina Modena, MD     Amy Collene Leyden, DO   [facility]   [address]   [place], [place] [address]   Fax:  [phone]       DD:  03/23/2017 16:23:16  DT:  03/24/2017 10:22:11 MD  D#:  [id number]

## 2017-04-03 ENCOUNTER — Other Ambulatory Visit (INDEPENDENT_AMBULATORY_CARE_PROVIDER_SITE_OTHER): Payer: Self-pay | Admitting: Surgical

## 2017-04-03 DIAGNOSIS — Z01818 Encounter for other preprocedural examination: Secondary | ICD-10-CM

## 2017-06-08 ENCOUNTER — Ambulatory Visit
Admission: RE | Admit: 2017-06-08 | Discharge: 2017-06-08 | Disposition: A | Discharge status: Home / Self Care | Payer: No Typology Code available for payment source | Source: Ambulatory Visit | Attending: Surgical | Admitting: Surgical

## 2017-06-08 ENCOUNTER — Ambulatory Visit (HOSPITAL_COMMUNITY)
Admission: RE | Admit: 2017-06-08 | Discharge: 2017-06-08 | Disposition: A | Discharge status: Home / Self Care | Payer: No Typology Code available for payment source | Source: Ambulatory Visit

## 2017-06-08 ENCOUNTER — Encounter (HOSPITAL_COMMUNITY): Payer: Self-pay

## 2017-06-08 ENCOUNTER — Ambulatory Visit (HOSPITAL_BASED_OUTPATIENT_CLINIC_OR_DEPARTMENT_OTHER): Payer: No Typology Code available for payment source | Admitting: Surgical

## 2017-06-08 ENCOUNTER — Encounter (INDEPENDENT_AMBULATORY_CARE_PROVIDER_SITE_OTHER): Payer: Self-pay | Admitting: Surgical

## 2017-06-08 ENCOUNTER — Ambulatory Visit (HOSPITAL_BASED_OUTPATIENT_CLINIC_OR_DEPARTMENT_OTHER)
Admission: RE | Admit: 2017-06-08 | Discharge: 2017-06-08 | Disposition: A | Discharge status: Home / Self Care | Payer: No Typology Code available for payment source | Source: Ambulatory Visit | Attending: Orthopaedic Surgery of the Spine | Admitting: Orthopaedic Surgery of the Spine

## 2017-06-08 ENCOUNTER — Other Ambulatory Visit (HOSPITAL_COMMUNITY): Payer: Self-pay

## 2017-06-08 VITALS — BP 148/85 | HR 76 | Temp 98.2°F | Ht 63.0 in | Wt 185.0 lb

## 2017-06-08 DIAGNOSIS — Z01818 Encounter for other preprocedural examination: Secondary | ICD-10-CM | POA: Insufficient documentation

## 2017-06-08 DIAGNOSIS — Z87891 Personal history of nicotine dependence: Secondary | ICD-10-CM | POA: Insufficient documentation

## 2017-06-08 DIAGNOSIS — M533 Sacrococcygeal disorders, not elsewhere classified: Secondary | ICD-10-CM

## 2017-06-08 DIAGNOSIS — Z96642 Presence of left artificial hip joint: Secondary | ICD-10-CM | POA: Insufficient documentation

## 2017-06-08 DIAGNOSIS — Z807 Family history of other malignant neoplasms of lymphoid, hematopoietic and related tissues: Secondary | ICD-10-CM | POA: Insufficient documentation

## 2017-06-08 DIAGNOSIS — Z823 Family history of stroke: Secondary | ICD-10-CM | POA: Insufficient documentation

## 2017-06-08 DIAGNOSIS — Z8249 Family history of ischemic heart disease and other diseases of the circulatory system: Secondary | ICD-10-CM | POA: Insufficient documentation

## 2017-06-08 DIAGNOSIS — Z79899 Other long term (current) drug therapy: Secondary | ICD-10-CM | POA: Insufficient documentation

## 2017-06-08 DIAGNOSIS — K219 Gastro-esophageal reflux disease without esophagitis: Secondary | ICD-10-CM | POA: Insufficient documentation

## 2017-06-08 DIAGNOSIS — Z9889 Other specified postprocedural states: Secondary | ICD-10-CM | POA: Insufficient documentation

## 2017-06-08 DIAGNOSIS — Z833 Family history of diabetes mellitus: Secondary | ICD-10-CM | POA: Insufficient documentation

## 2017-06-08 DIAGNOSIS — Z79890 Hormone replacement therapy (postmenopausal): Secondary | ICD-10-CM | POA: Insufficient documentation

## 2017-06-08 DIAGNOSIS — M4327 Fusion of spine, lumbosacral region: Secondary | ICD-10-CM

## 2017-06-08 DIAGNOSIS — M48061 Spinal stenosis, lumbar region without neurogenic claudication: Secondary | ICD-10-CM | POA: Insufficient documentation

## 2017-06-08 DIAGNOSIS — M47897 Other spondylosis, lumbosacral region: Secondary | ICD-10-CM

## 2017-06-08 DIAGNOSIS — I10 Essential (primary) hypertension: Secondary | ICD-10-CM | POA: Insufficient documentation

## 2017-06-08 DIAGNOSIS — E559 Vitamin D deficiency, unspecified: Secondary | ICD-10-CM | POA: Insufficient documentation

## 2017-06-08 DIAGNOSIS — Z806 Family history of leukemia: Secondary | ICD-10-CM | POA: Insufficient documentation

## 2017-06-08 DIAGNOSIS — E785 Hyperlipidemia, unspecified: Secondary | ICD-10-CM | POA: Insufficient documentation

## 2017-06-08 DIAGNOSIS — R7303 Prediabetes: Secondary | ICD-10-CM | POA: Insufficient documentation

## 2017-06-08 DIAGNOSIS — Z7984 Long term (current) use of oral hypoglycemic drugs: Secondary | ICD-10-CM | POA: Insufficient documentation

## 2017-06-08 DIAGNOSIS — M4802 Spinal stenosis, cervical region: Secondary | ICD-10-CM

## 2017-06-08 DIAGNOSIS — M4696 Unspecified inflammatory spondylopathy, lumbar region: Secondary | ICD-10-CM

## 2017-06-08 DIAGNOSIS — R200 Anesthesia of skin: Secondary | ICD-10-CM | POA: Insufficient documentation

## 2017-06-08 DIAGNOSIS — Z981 Arthrodesis status: Secondary | ICD-10-CM | POA: Insufficient documentation

## 2017-06-08 DIAGNOSIS — Z885 Allergy status to narcotic agent status: Secondary | ICD-10-CM | POA: Insufficient documentation

## 2017-06-08 DIAGNOSIS — Z90710 Acquired absence of both cervix and uterus: Secondary | ICD-10-CM | POA: Insufficient documentation

## 2017-06-08 DIAGNOSIS — Z881 Allergy status to other antibiotic agents status: Secondary | ICD-10-CM | POA: Insufficient documentation

## 2017-06-08 HISTORY — DX: Essential (primary) hypertension: I10

## 2017-06-08 HISTORY — DX: Hypothyroidism, unspecified: E03.9

## 2017-06-08 HISTORY — DX: Hyperlipidemia, unspecified: E78.5

## 2017-06-08 HISTORY — DX: Prediabetes: R73.03

## 2017-06-08 HISTORY — DX: Disorder of thyroid, unspecified: E07.9

## 2017-06-08 HISTORY — DX: Presence of spectacles and contact lenses: Z97.3

## 2017-06-08 HISTORY — DX: Nausea with vomiting, unspecified: R11.2

## 2017-06-08 HISTORY — DX: Fibromyalgia: M79.7

## 2017-06-08 LAB — POC BLOOD GLUCOSE (RESULTS): GLUCOSE, POC: 75 mg/dL (ref 70–105)

## 2017-06-08 MED ORDER — DEXTROSE 5 % IN WATER (D5W) INTRAVENOUS SOLUTION
2.0000 g | Freq: Once | INTRAVENOUS | Status: AC
Start: 2017-06-09 — End: 2017-06-09
  Administered 2017-06-09: 3 g via INTRAVENOUS
  Filled 2017-06-08: qty 20

## 2017-06-08 MED ORDER — DEXTROSE 5 % IN WATER (D5W) INTRAVENOUS SOLUTION
2.0000 g | Freq: Once | INTRAVENOUS | Status: AC
Start: 2017-06-09 — End: 2017-06-09
  Administered 2017-06-09 (×2): 3 g via INTRAVENOUS
  Filled 2017-06-08: qty 20

## 2017-06-08 NOTE — Anesthesia Preprocedure Evaluation (Addendum)
ANESTHESIA PRE-OP EVALUATION  Planned Procedure: DECOMPRESSION SPINE LUMBAR POSTERIOR 2 LEVELS (N/A Spine Lumbar)  FUSION SPINE LUMBAR MAZOR X ROBOTIC ASSISTED 3 LEVELS OR MORE (N/A Spine Lumbar)  Review of Systems     anesthesia history negative     patient summary reviewed          Pulmonary     Cardiovascular    ECG reviewed        GI/Hepatic/Renal        Endo/Other          Neuro/Psych/MS        Cancer                   Physical Assessment      Patient summary reviewed   Airway       Mallampati: II    TM distance: <3 FB    Neck ROM: full  Mouth Opening: fair.            Dental       Dentition intact             Pulmonary    Breath sounds clear to auscultation  (-) no rhonchi, no decreased breath sounds, no wheezes, no rales and no stridor     Cardiovascular             Other findings            Plan  Planned anesthesia type: general    ASA 3     Intravenous induction     Anesthetic plan and risks discussed with patient.         Use of blood products discussed with patient whom.     Patient's NPO status is appropriate for Anesthesia.           Plan discussed with CRNA.    (NPO today)           preop medical clearance on chart with labs, EKG and CXR    Consults: None    Patient instructed to take the following medications day of surgery,wellbutrin, synthroid .    Copy of Anesthesia Consent provided to patient or guardian to review prior to procedure. Pt instructed that consent will be signed prior to procedure with anesthesiologist.Instructed to hold vitamins/herbs and NSAID 1 week prior to procedure.Instructed to hold ACE/ARB med am of surgery-cozaar.

## 2017-06-08 NOTE — H&P (Signed)
PREOPERATIVE OPIOID COUNSELING DOCUMENTATION:    Surgery for which opioid prescription is indicated: Extension of fusion to L3 with TLIF, allograft, and BMP    Postoperative Opioid, Dosage, Frequency, and Dispense#: Norco 5-325 mg #84 2 tabs q4h prn pain 0 refills    Duration of Opioid Prescription by Arta SilenceSanford Emaree Chiu, MD - 6 weeks   Starting - Date of surgery: 06/09/17   Ending - Date of 6 week post-operative visit: ~07/21/17    Opioid CONSENT to Treat - Completed: Yes.  Date: 06/08/17  Opioid CONTRACT - Completed: Yes.  Date: 06/08/17  --------------------------------------------------------------------------------------------------------  CSAPP Search Results:  See Below   --------------------------------------------------------------------------------------------------------  Opioid Prescription - First Prescription  Diagnosis requiring prescription: Pain from Spine Surgery    Medication Dosage/Frequency being prescribed: See above    I have reviewed prior medication history in the medical record for this patient.  I have also reviewed information contained in the state controlled prescription drug monitoring database.     I have discussed any history of non-pharmacological treatment with the patient.  The patient reports no prior treatment history.      The patient denies history of substance abuse treatment.      Physical exam findings and/or clinical history warranting use of opioid treatment include: Surgical pain from Spine Surgery    My goals for treatment include: Treatment of Surgical Pain from Spine Surgery    I have discussed the risk of opioid addiction with the patient.  I have also discussed the risk of using sedatives and alcohol while taking opioids.                   Multi State Controlled Substance Patient Full Name Report Report Date 06/08/2017   From 12/07/2016 To 06/08/2017 Date of Birth 01/27/1966   Prescription Count 1   Last Name Goeser First Name [name] Middle Name                Patients  included in report that appear to match the search criteria.   Last Name First Name Middle Name Gender Address       Prescriber Name Prescriber DEA & Zip Dispenser Name Dispenser DEA & Zip Rx Written Date Rx Dispense Date & Date Sold Rx Number Product Name Strength Qty Days # of Refill Sched Payment Type   Fletes,  Devinne   ([facility])   Hubert AzureMERY, Aaric Dolph E MD [id number] [phone] [facility] [id number] [phone] 01/16/2017 01/16/2017 01/16/2017 [id number] GABAPENTIN 300 mg/1 180 60 0/3 [address] Insurance                  [name] TazewellZydonik, PA-C  06/08/2017, 15:21

## 2017-06-08 NOTE — H&P (Signed)
PATIENT NAME: Courtney [name], Courtney [name] [facility]  HOSPITAL NUMBER:  [id number]  DATE OF SERVICE: 06/08/2017  DATE OF BIRTH:  1966-03-19    HISTORY AND PHYSICAL    REQUESTED DATE OF SURGERY:  June 09, 2017.    NAME OF PROCEDURE:  Revision of lumbar decompression and fusion with extension to L3 with TLIF at L3-L4.  Robot-assisted with Mazor and Medtronic hardware. Use of allograft and BMP.    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    HISTORY OF PRESENT ILLNESS:  The patient is a 51 year old Caucasian female who presents to clinic today for clearance of the above-mentioned procedure.  She has been previously cleared by the primary care provider to undergo the above-mentioned surgery.  The patient initially presented to see Dr. Shea Evans in July 2019, where she had a chief complaint of low back pain with radiation down the bilateral right greater than left lower extremity since August 2017.  In 1985, she had undergone an L4-S1 lumbar decompression and fusion.  She had some degree of back pain following surgery, however, it did not begin to limit her from completing her activities of daily living until August 2017.  At that time, with no known injury she had awoken with increased low back pain that radiated down to both the posterior bilateral buttocks, thighs and calves into the ankles and heels.  The pain is described as a dull throb that increases with activity.  She also admits to numbness across the bilateral feet that also appears to be activity related.  She had failed conservative treatment including activity modification, physical therapy, injections, NSAIDs and gabapentin.  MRI had been done, which showed severe stenosis at L3-L4 above the existing L5-S1 fusion with bilateral foraminal stenosis.  The patient was offered extension fusion with decompression at L3-L4 and TLIF, and she wishes to proceed.    PAST MEDICAL HISTORY:  Significant for hypertension, GERD, hyperlipidemia,  prediabetes, vitamin D deficiency.  Denies heart attack, diabetes, stroke, blood clot or bleeding disorder.    PAST SURGICAL HISTORY:  Significant for surgery on the left leg x3 following a dog bite, previous lumbar fusion in 1985, C-section x3, hysterectomy, right ankle reconstruction, bladder sling, left total hip arthroplasty, EGD, tonsillectomy and adenoidectomy, colonoscopy.  She had received a blood transfusion following her hip replacement without reaction and denies any anesthesia complication.    SOCIAL HISTORY:  The patient had quit smoking 10 years ago and admits to occasional alcohol use.    FAMILY MEDICAL HISTORY:  Significant for in her father, diabetes, MI, stroke, hairy-cell leukemia, Hodgkin lymphoma.  Mother, stroke, MI, diabetes.  Brother, melanoma.  In her mother's side of the family, there is a significant history of osteoarthritis and rheumatoid arthritis.  The patient has 3 sons who are alive and well.    ALLERGIES:  MACRODANTIN and MORPHINE.  MORPHINE causes face swelling.    MEDICATIONS:  Wellbutrin, estradiol, Lopid, Synthroid, Cozaar, Glucophage, although she denies currently taking metformin and she has been holding all vitamins and supplements as well as NSAIDs for a week.  She admits to taking Excedrin Migraine as needed.    REVIEW OF SYSTEMS:  Positive for headaches, nasal congestion, difficulty swallowing, chest pain from her ribs going "in and out," constipation, intermittent foul-smelling urine over the past few months, although denies any significant burning or bleeding with urination.  She also admits to increasing right arm weakness over the past 3-4 months.  Otherwise, denies fever, chills, nausea, vomiting or  diarrhea, changes in vision, hearing, sense of smell, sore throat, nosebleeds, angina, palpitations, cough, shortness of breath, changes in bowel or bladder habits, blood in stool or urine, changes in urinary frequency, weakness other than right arm, changes in weight,  mood, appetite, unexplained bleeding or bruising, changes in skin or hair texture,  lymphadenopathy in neck, axilla, or groin.    PHYSICAL EXAMINATION:  Blood pressure 148/85, pulse of 76, temperature of 36.8 degrees Celsius, height of 5 feet 3 inches, weight of 185 pounds, BMI of 32. General:  Well nourished, well developed, no acute distress.  Alert and oriented.  Psych:  Appropriate mood and affect, calm and cooperative.  HEENT:  Normocephalic, atraumatic.  Sclerae nonicteric.  Extraocular movements intact.  Neck:  Supple without lymphadenopathy.  Mucous membranes moist. Pulmonary:  Lungs clear to auscultation bilateral without rales, rhonchi or wheezes.  No cyanosis, clubbing or swollen extremities.  No accessory muscle use on room air.  Cardiovascular:  Regular rate and rhythm without murmurs, gallops, rubs.  She has 2+ palpable radial and pedal pulses.  Brisk capillary refill of the bilateral middle digits.  No carotid bruits to auscultation.  Skin:  Warm and well perfused.  Previous lumbar incision well healed.  Abdomen normoactive bowel sounds, nondistended, nontender.  Gait is normal.  She does have a minor degree of difficulty completing tandem gait and will step out once within the span of the room.  Strength is 5/5 in the bilateral lower extremities for TA, EHL, GS, Q, HS, HF, abduction and adduction of the hips.  Sensation is intact and equal L2 through S1, 1+ bilateral patella and Achilles reflexes.  No clonus at the ankles.    IMAGING:  The patient had a CT scan of the lumbar spine done with Mazor protocol today that was reviewed alongside Dr. Shea EvansEmery.  Lumbar films were obtained on January 15, 2017, showing a grade III anterolisthesis of L4 on S1 without instability on flexion or extension.  Lumbar MRI is available through [facility] from May 30, 2016, showing moderate-to-severe central stenosis at L3-L4 above the existing fusion.  No significant central stenosis throughout remaining lumbar spine.     ASSESSMENT:  Central stenosis at L3-L4 above existing L4-S1 fusion.    PLAN:  - Consent was obtained and signed in clinic today after discussion of indications, risks, results and recovery with the patient.  Risks include, but not limited to, blood loss, infection, nerve damage, cosmetic deformity, need for additional surgery, persistent pain, dural tear, nonunion, MI, stroke, blood clot, paralysis, blindness, death.   - The patient accepts these risks and wishes to proceed.   - PAT today.   - Narcotic consent and contract were reviewed and signed in clinic today alongside Dr. Shea EvansEmery and the patient.  The patient is aware that we will write for pain medication within the first 6 weeks postoperatively.   - See CSAPP results attached in separate note.   - Postop visit scheduled for 2 and 6 weeks postoperatively.   - Proceed with Surgery as scheduled.   - The patient was advised for postop restrictions including no heavy lifting greater than 10-15 pounds x3 months, limiting the amount of bending and twisting during this period as well and no NSAIDs for 8-10 weeks postoperatively.    - Remaining opioid documentation will be found within a typed noted available in the chart.     The patient was seen independently in the clinic today with co-signing physician, Dr. Arta SilenceSanford Lenox Ladouceur, available.  Adolm JosephJosee L Zydonik, PA-C      Arta SilenceSanford Mercer Peifer, MD  Professor and Chair   [place] Department of Orthopaedics     FootvilleJosee Zydonik, [place] JerseyPA-C  06/08/2017, 15:58                  DD:  06/08/2017 15:18:02  DT:  06/08/2017 15:46:30 DG  D#:  [id number]

## 2017-06-09 ENCOUNTER — Inpatient Hospital Stay (HOSPITAL_COMMUNITY)
Admission: RE | Admit: 2017-06-09 | Discharge: 2017-06-09 | Disposition: A | Discharge status: Home / Self Care | Payer: No Typology Code available for payment source | Source: Ambulatory Visit

## 2017-06-09 ENCOUNTER — Encounter (HOSPITAL_COMMUNITY)
Admission: RE | Disposition: A | Discharge status: Home / Self Care | Payer: Self-pay | Source: Ambulatory Visit | Attending: Orthopaedic Surgery of the Spine

## 2017-06-09 ENCOUNTER — Inpatient Hospital Stay
Admission: RE | Admit: 2017-06-09 | Discharge: 2017-06-11 | DRG: 455 | Disposition: A | Discharge status: Home / Self Care | Payer: No Typology Code available for payment source | Source: Ambulatory Visit | Attending: Orthopaedic Surgery of the Spine | Admitting: Orthopaedic Surgery of the Spine

## 2017-06-09 ENCOUNTER — Encounter (HOSPITAL_COMMUNITY): Payer: Self-pay

## 2017-06-09 ENCOUNTER — Inpatient Hospital Stay (HOSPITAL_COMMUNITY): Payer: No Typology Code available for payment source | Admitting: Certified Registered"

## 2017-06-09 ENCOUNTER — Inpatient Hospital Stay (HOSPITAL_COMMUNITY): Payer: No Typology Code available for payment source | Admitting: Orthopaedic Surgery of the Spine

## 2017-06-09 DIAGNOSIS — M48061 Spinal stenosis, lumbar region without neurogenic claudication: Principal | ICD-10-CM | POA: Diagnosis present

## 2017-06-09 DIAGNOSIS — E119 Type 2 diabetes mellitus without complications: Secondary | ICD-10-CM | POA: Diagnosis present

## 2017-06-09 DIAGNOSIS — Z01818 Encounter for other preprocedural examination: Secondary | ICD-10-CM

## 2017-06-09 DIAGNOSIS — M797 Fibromyalgia: Secondary | ICD-10-CM | POA: Diagnosis present

## 2017-06-09 DIAGNOSIS — E785 Hyperlipidemia, unspecified: Secondary | ICD-10-CM | POA: Diagnosis present

## 2017-06-09 DIAGNOSIS — G4730 Sleep apnea, unspecified: Secondary | ICD-10-CM | POA: Diagnosis present

## 2017-06-09 DIAGNOSIS — M4317 Spondylolisthesis, lumbosacral region: Secondary | ICD-10-CM | POA: Diagnosis present

## 2017-06-09 DIAGNOSIS — I10 Essential (primary) hypertension: Secondary | ICD-10-CM | POA: Diagnosis present

## 2017-06-09 DIAGNOSIS — F329 Major depressive disorder, single episode, unspecified: Secondary | ICD-10-CM | POA: Diagnosis present

## 2017-06-09 DIAGNOSIS — E039 Hypothyroidism, unspecified: Secondary | ICD-10-CM | POA: Diagnosis present

## 2017-06-09 DIAGNOSIS — K219 Gastro-esophageal reflux disease without esophagitis: Secondary | ICD-10-CM | POA: Diagnosis present

## 2017-06-09 LAB — TYPE AND SCREEN
ABO/RH(D): B POS
ANTIBODY SCREEN: NEGATIVE

## 2017-06-09 LAB — POC BLOOD GLUCOSE (RESULTS): GLUCOSE, POC: 80 mg/dl (ref 70–105)

## 2017-06-09 SURGERY — DECOMPRESSION SPINE LUMBAR POSTERIOR 2 LEVELS
Anesthesia: General | Site: Spine Lumbar | Wound class: Clean Wound: Uninfected operative wounds in which no inflammation occurred

## 2017-06-09 MED ORDER — GEMFIBROZIL 600 MG TABLET
600.00 mg | ORAL_TABLET | Freq: Two times a day (BID) | ORAL | Status: DC
Start: 2017-06-10 — End: 2017-06-11
  Administered 2017-06-10 – 2017-06-11 (×3): 600 mg via ORAL
  Filled 2017-06-09 (×5): qty 1

## 2017-06-09 MED ORDER — SODIUM CHLORIDE 0.9 % IRRIGATION SOLUTION
1000.0000 mL | Status: DC | PRN
Start: 2017-06-09 — End: 2017-06-09
  Administered 2017-06-09: 1000 mL

## 2017-06-09 MED ORDER — EPINEPHRINE 1 MG/ML (1 ML) INJECTION SOLUTION
INTRAMUSCULAR | Status: AC
Start: 2017-06-09 — End: 2017-06-09
  Filled 2017-06-09: qty 1

## 2017-06-09 MED ORDER — FENTANYL (PF) 50 MCG/ML INJECTION SOLUTION
Freq: Once | INTRAMUSCULAR | Status: DC | PRN
Start: 2017-06-09 — End: 2017-06-09
  Administered 2017-06-09: 150 ug via INTRAVENOUS

## 2017-06-09 MED ORDER — PROPOFOL 10 MG/ML INTRAVENOUS EMULSION
INTRAVENOUS | Status: DC | PRN
Start: 2017-06-09 — End: 2017-06-09
  Administered 2017-06-09: 25 ug/kg/min via INTRAVENOUS
  Administered 2017-06-09: 50 ug/kg/min via INTRAVENOUS
  Administered 2017-06-09: 0 ug/kg/min via INTRAVENOUS
  Administered 2017-06-09: 100 ug/kg/min via INTRAVENOUS

## 2017-06-09 MED ORDER — PANTOPRAZOLE 40 MG TABLET,DELAYED RELEASE
40.00 mg | DELAYED_RELEASE_TABLET | Freq: Every day | ORAL | Status: DC
Start: 2017-06-10 — End: 2017-06-11
  Administered 2017-06-10 – 2017-06-11 (×2): 40 mg via ORAL
  Filled 2017-06-09 (×2): qty 1

## 2017-06-09 MED ORDER — BUPROPION HCL SR 150 MG TABLET,12 HR SUSTAINED-RELEASE
150.00 mg | ORAL_TABLET | Freq: Two times a day (BID) | ORAL | Status: DC
Start: 2017-06-10 — End: 2017-06-11
  Administered 2017-06-10: 150 mg via ORAL
  Administered 2017-06-10: 0 mg via ORAL
  Administered 2017-06-11: 150 mg via ORAL
  Filled 2017-06-09 (×4): qty 1

## 2017-06-09 MED ORDER — REMIFENTANIL 50 MCG/ML INFUSION - FOR ANES
INTRAVENOUS | Status: DC | PRN
Start: 2017-06-09 — End: 2017-06-09
  Administered 2017-06-09: 0.05 ug/kg/min via INTRAVENOUS
  Administered 2017-06-09: .025 ug/kg/min via INTRAVENOUS
  Administered 2017-06-09: 0 ug/kg/min via INTRAVENOUS

## 2017-06-09 MED ORDER — FLUOCINOLONE 0.025 % TOPICAL CREAM
TOPICAL_CREAM | Freq: Two times a day (BID) | CUTANEOUS | Status: DC
Start: 2017-06-10 — End: 2017-06-11
  Administered 2017-06-10 – 2017-06-11 (×3): 0 via TOPICAL
  Filled 2017-06-09: qty 60

## 2017-06-09 MED ORDER — VANCOMYCIN 1,000 MG IV POWDER - FOR OR
1.0000 g | Freq: Once | TOPICAL | Status: DC | PRN
Start: 2017-06-09 — End: 2017-06-09
  Administered 2017-06-09: 2 g

## 2017-06-09 MED ORDER — HYDROCODONE 5 MG-ACETAMINOPHEN 325 MG TABLET
1.0000 | ORAL_TABLET | ORAL | Status: DC | PRN
Start: 2017-06-09 — End: 2017-06-10

## 2017-06-09 MED ORDER — SURGIFOAM SIZE 100 CM SPONGE
1.0000 | VAGINAL_SPONGE | Freq: Once | CUTANEOUS | Status: DC | PRN
Start: 2017-06-09 — End: 2017-06-09
  Administered 2017-06-09: 1 via TOPICAL

## 2017-06-09 MED ORDER — SENNOSIDES 8.6 MG-DOCUSATE SODIUM 50 MG TABLET
1.0000 | ORAL_TABLET | Freq: Every evening | ORAL | Status: DC | PRN
Start: 2017-06-09 — End: 2017-06-11

## 2017-06-09 MED ORDER — REMIFENTANIL 50 MCG/ML INFUSION - FOR ANES
INTRAVENOUS | Status: DC | PRN
Start: 2017-06-09 — End: 2017-06-09

## 2017-06-09 MED ORDER — CEFAZOLIN 2 GRAM SOLUTION FOR INJECTION - IV PUSH KIT
2.0000 g | Freq: Three times a day (TID) | INTRAMUSCULAR | Status: AC
Start: 2017-06-10 — End: 2017-06-10
  Administered 2017-06-10 (×3): 2 g via INTRAVENOUS
  Filled 2017-06-09 (×3): qty 20

## 2017-06-09 MED ORDER — SURGIFOAM SIZE 100 CM SPONGE
VAGINAL_SPONGE | CUTANEOUS | Status: AC
Start: 2017-06-09 — End: 2017-06-09
  Filled 2017-06-09: qty 2

## 2017-06-09 MED ORDER — BUPIVACAINE-EPINEPHRINE (PF) 0.25 %-1:200,000 INJECTION SOLUTION
INTRAMUSCULAR | Status: AC
Start: 2017-06-09 — End: 2017-06-09
  Filled 2017-06-09: qty 90

## 2017-06-09 MED ORDER — HYDROMORPHONE 2 MG/ML INJECTION SYRINGE
INJECTION | Freq: Once | INTRAMUSCULAR | Status: DC | PRN
Start: 2017-06-09 — End: 2017-06-09
  Administered 2017-06-09 (×2): 0.5 mg via INTRAVENOUS

## 2017-06-09 MED ORDER — SENNOSIDES 8.6 MG-DOCUSATE SODIUM 50 MG TABLET
1.00 | ORAL_TABLET | Freq: Every evening | ORAL | 0 refills | Status: DC
Start: 2017-06-09 — End: 2017-11-17

## 2017-06-09 MED ORDER — GELATIN MATRIX SEALANT (FLOSEAL) 10 ML KIT
PACK | CUTANEOUS | Status: AC
Start: 2017-06-09 — End: 2017-06-09
  Filled 2017-06-09: qty 1

## 2017-06-09 MED ORDER — SODIUM CHLORIDE 0.9 % INTRAVENOUS SOLUTION
INTRAVENOUS | Status: DC | PRN
Start: 2017-06-09 — End: 2017-06-09

## 2017-06-09 MED ORDER — PROPOFOL 10 MG/ML IV BOLUS
INJECTION | Freq: Once | INTRAVENOUS | Status: DC | PRN
Start: 2017-06-09 — End: 2017-06-09
  Administered 2017-06-09: 100 mg via INTRAVENOUS
  Administered 2017-06-09: 70 mg via INTRAVENOUS
  Administered 2017-06-09: 200 mg via INTRAVENOUS

## 2017-06-09 MED ORDER — THROMBIN (RECOMBINANT) 5,000 UNIT TOPICAL SOLUTION
CUTANEOUS | Status: AC
Start: 2017-06-09 — End: 2017-06-09
  Filled 2017-06-09: qty 2

## 2017-06-09 MED ORDER — APREPITANT 40 MG CAPSULE
ORAL_CAPSULE | Freq: Once | ORAL | Status: DC | PRN
Start: 2017-06-09 — End: 2017-06-09
  Administered 2017-06-09: 40 mg via ORAL

## 2017-06-09 MED ORDER — SODIUM CHLORIDE 0.9 % (FLUSH) INJECTION SYRINGE
2.00 mL | INJECTION | INTRAMUSCULAR | Status: DC | PRN
Start: 2017-06-09 — End: 2017-06-11

## 2017-06-09 MED ORDER — HYDROCODONE 5 MG-ACETAMINOPHEN 325 MG TABLET
2.0000 | ORAL_TABLET | ORAL | Status: DC | PRN
Start: 2017-06-09 — End: 2017-06-10
  Administered 2017-06-10 (×2): 2 via ORAL
  Filled 2017-06-09 (×2): qty 2

## 2017-06-09 MED ORDER — THROMBIN (RECOMBINANT) 5,000 UNIT TOPICAL SOLUTION
Freq: Once | INTRAMUSCULAR | Status: DC | PRN
Start: 2017-06-09 — End: 2017-06-09

## 2017-06-09 MED ORDER — INSULIN LISPRO 100 UNIT/ML SUB-Q SSIP
3.0000 [IU] | INJECTION | Freq: Four times a day (QID) | SUBCUTANEOUS | Status: DC | PRN
Start: 2017-06-09 — End: 2017-06-11
  Filled 2017-06-09: qty 3

## 2017-06-09 MED ORDER — ONDANSETRON HCL (PF) 4 MG/2 ML INJECTION SOLUTION
4.0000 mg | Freq: Four times a day (QID) | INTRAMUSCULAR | Status: DC | PRN
Start: 2017-06-09 — End: 2017-06-11

## 2017-06-09 MED ORDER — SCOPOLAMINE 1 MG OVER 3 DAYS TRANSDERMAL PATCH
MEDICATED_PATCH | Freq: Once | TRANSDERMAL | Status: DC | PRN
Start: 2017-06-09 — End: 2017-06-09
  Administered 2017-06-09: 1 via TRANSDERMAL

## 2017-06-09 MED ORDER — SODIUM CHLORIDE 0.9 % (FLUSH) INJECTION SYRINGE
2.00 mL | INJECTION | Freq: Three times a day (TID) | INTRAMUSCULAR | Status: DC
Start: 2017-06-09 — End: 2017-06-11
  Administered 2017-06-09 – 2017-06-10 (×2): 0 mL
  Administered 2017-06-10: 2 mL
  Administered 2017-06-10: 0 mL
  Administered 2017-06-11: 2 mL

## 2017-06-09 MED ORDER — ONDANSETRON HCL (PF) 4 MG/2 ML INJECTION SOLUTION
Freq: Once | INTRAMUSCULAR | Status: DC | PRN
Start: 2017-06-09 — End: 2017-06-09
  Administered 2017-06-09: 4 mg via INTRAVENOUS

## 2017-06-09 MED ORDER — LOSARTAN 50 MG TABLET
50.0000 mg | ORAL_TABLET | Freq: Every day | ORAL | Status: DC
Start: 2017-06-10 — End: 2017-06-11
  Administered 2017-06-10: 0 mg via ORAL
  Administered 2017-06-11: 50 mg via ORAL
  Filled 2017-06-09 (×2): qty 1

## 2017-06-09 MED ORDER — SODIUM CHLORIDE 0.9 % INTRAVENOUS SOLUTION
500.0000 mL | Freq: Once | INTRAVENOUS | Status: DC | PRN
Start: 2017-06-09 — End: 2017-06-09
  Administered 2017-06-09: 60 mL via INTRAMUSCULAR

## 2017-06-09 MED ORDER — LACTATED RINGERS INTRAVENOUS SOLUTION
INTRAVENOUS | Status: DC
Start: 2017-06-09 — End: 2017-06-09

## 2017-06-09 MED ORDER — BACITRACIN 500 UNIT/G OINTMENT TUBE
TOPICAL_OINTMENT | Freq: Once | CUTANEOUS | Status: DC | PRN
Start: 2017-06-09 — End: 2017-06-09
  Administered 2017-06-09: 1 via TOPICAL

## 2017-06-09 MED ORDER — BUPIVACAINE-EPINEPHRINE (PF) 0.25 %-1:200,000 INJECTION SOLUTION
30.0000 mL | Freq: Once | INTRAMUSCULAR | Status: DC | PRN
Start: 2017-06-09 — End: 2017-06-09
  Administered 2017-06-09: 80 mL via INTRAMUSCULAR

## 2017-06-09 MED ORDER — DEXTROSE 50 % IN WATER (D50W) INTRAVENOUS SYRINGE
12.5000 g | INJECTION | INTRAVENOUS | Status: DC | PRN
Start: 2017-06-09 — End: 2017-06-11

## 2017-06-09 MED ORDER — DEXTROSE 5 % IN WATER (D5W) INTRAVENOUS SOLUTION
2.0000 g | Freq: Three times a day (TID) | INTRAVENOUS | Status: DC
Start: 2017-06-09 — End: 2017-06-09

## 2017-06-09 MED ORDER — LIDOCAINE (PF) 100 MG/5 ML (2 %) INTRAVENOUS SYRINGE
INJECTION | Freq: Once | INTRAVENOUS | Status: DC | PRN
Start: 2017-06-09 — End: 2017-06-09
  Administered 2017-06-09 (×2): 80 mg via INTRAVENOUS

## 2017-06-09 MED ORDER — VANCOMYCIN 1,000 MG INTRAVENOUS INJECTION
INTRAVENOUS | Status: AC
Start: 2017-06-09 — End: 2017-06-09
  Filled 2017-06-09: qty 10

## 2017-06-09 MED ORDER — SODIUM CHLORIDE 0.9 % INTRAVENOUS SOLUTION
INTRAVENOUS | Status: DC
Start: 2017-06-09 — End: 2017-06-10

## 2017-06-09 MED ORDER — MIDAZOLAM 1 MG/ML INJECTION SOLUTION
Freq: Once | INTRAMUSCULAR | Status: DC | PRN
Start: 2017-06-09 — End: 2017-06-09
  Administered 2017-06-09: 2 mg via INTRAVENOUS

## 2017-06-09 MED ORDER — DEXMEDETOMIDINE 4 MCG/ML IV DILUTION
Freq: Once | INTRAMUSCULAR | Status: DC | PRN
Start: 2017-06-09 — End: 2017-06-09
  Administered 2017-06-09: 20:00:00 16 ug via INTRAVENOUS

## 2017-06-09 MED ORDER — HYDROCODONE 5 MG-ACETAMINOPHEN 325 MG TABLET
1.00 | ORAL_TABLET | ORAL | 0 refills | Status: DC | PRN
Start: 2017-06-09 — End: 2017-06-11

## 2017-06-09 MED ORDER — BUPROPION HCL XL 300 MG 24 HR TABLET, EXTENDED RELEASE
300.00 mg | ORAL_TABLET | Freq: Every morning | ORAL | Status: DC
Start: 2017-06-10 — End: 2017-06-09

## 2017-06-09 MED ORDER — LEVOTHYROXINE 50 MCG TABLET
50.0000 ug | ORAL_TABLET | Freq: Every day | ORAL | Status: DC
Start: 2017-06-10 — End: 2017-06-11
  Administered 2017-06-10: 0 ug via ORAL
  Filled 2017-06-09 (×2): qty 1

## 2017-06-09 MED ORDER — BACITRACIN 500 UNIT/G OINTMENT TUBE
TOPICAL_OINTMENT | CUTANEOUS | Status: AC
Start: 2017-06-09 — End: 2017-06-09
  Filled 2017-06-09: qty 28.4

## 2017-06-09 MED ORDER — DEXAMETHASONE SODIUM PHOSPHATE 4 MG/ML INJECTION SOLUTION
Freq: Once | INTRAMUSCULAR | Status: DC | PRN
Start: 2017-06-09 — End: 2017-06-09
  Administered 2017-06-09 (×2): 4 mg via INTRAVENOUS

## 2017-06-09 MED ORDER — PHENYLEPHRINE 60MG IN NS 250ML INFUSION - FOR ANES
INTRAVENOUS | Status: DC | PRN
Start: 2017-06-09 — End: 2017-06-09
  Administered 2017-06-09: 0 ug/kg/min via INTRAVENOUS
  Administered 2017-06-09: 0.3 ug/kg/min via INTRAVENOUS

## 2017-06-09 MED ORDER — HYDROMORPHONE 2 MG/ML INJECTION SYRINGE
0.4000 mg | INJECTION | INTRAMUSCULAR | Status: DC | PRN
Start: 2017-06-09 — End: 2017-06-09
  Administered 2017-06-09 (×4): 0.4 mg via INTRAVENOUS
  Filled 2017-06-09: qty 1

## 2017-06-09 MED ORDER — SUGAMMADEX 100 MG/ML INTRAVENOUS SOLUTION
Freq: Once | INTRAVENOUS | Status: DC | PRN
Start: 2017-06-09 — End: 2017-06-09
  Administered 2017-06-09: 200 mg via INTRAVENOUS

## 2017-06-09 MED ORDER — ROCURONIUM 10 MG/ML INTRAVENOUS SOLUTION
Freq: Once | INTRAVENOUS | Status: DC | PRN
Start: 2017-06-09 — End: 2017-06-09
  Administered 2017-06-09: 50 mg via INTRAVENOUS
  Administered 2017-06-09: 10 mg via INTRAVENOUS
  Administered 2017-06-09 (×3): 20 mg via INTRAVENOUS
  Administered 2017-06-09: 30 mg via INTRAVENOUS

## 2017-06-09 MED ADMIN — sugammadex 100 mg/mL intravenous solution: INTRAVENOUS | @ 20:00:00

## 2017-06-09 MED ADMIN — remifentaniL 1 mg intravenous solution: INTRAVENOUS | @ 19:00:00

## 2017-06-09 MED ADMIN — propofoL 10 mg/mL intravenous emulsion: INTRAVENOUS | @ 16:00:00

## 2017-06-09 MED ADMIN — sodium chloride 0.9 % intravenous solution: INTRAVENOUS | @ 14:00:00

## 2017-06-09 MED ADMIN — remifentaniL 1 mg intravenous solution: INTRAVENOUS | @ 18:00:00

## 2017-06-09 MED ADMIN — artificial tears with lanolin eye ointment: INTRAVENOUS | @ 11:00:00 | NDC 00023031204

## 2017-06-09 MED ADMIN — dextrose 5 % and 0.45 % sodium chloride intravenous solution: INTRAVENOUS | @ 19:00:00 | NDC 00264761200

## 2017-06-09 MED ADMIN — lanolin-oxyquin-pet, hydrophil topical ointment: INTRAVENOUS | @ 20:00:00 | NDC 09999989257

## 2017-06-09 MED ADMIN — sodium chloride 0.9 % (flush) injection syringe: TOPICAL | @ 14:00:00

## 2017-06-09 MED ADMIN — albumin, human 5 % intravenous solution: INTRAVENOUS | @ 17:00:00

## 2017-06-09 MED ADMIN — artificial tears with lanolin eye ointment: INTRAVENOUS | @ 20:00:00 | NDC 00023031204

## 2017-06-09 MED ADMIN — herbal drugs lotion: TOPICAL | @ 20:00:00 | NDC 0259060180

## 2017-06-09 SURGICAL SUPPLY — 66 items
6.5 x 40mm screw ×2 IMPLANT
6.5 x 45 mm screw ×2 IMPLANT
7.5 X 55MM SCREW ×2 IMPLANT
BIT DRILL 3.8MM 3MM PREC NEURO STRL LF  DISP (ORTHOPEDICS (NOT IMPLANTS)) ×1 IMPLANT
BIT DRILL 3.8MM 3MM PREC NEURO_STRL LF DISP (ORTHOPEDICS (NOT IMPLANTS)) ×2
BURR SURG 3MM PREC RND (CUTTING ELEMENTS) ×1
BURR SURG 3MM PREC RND (SURGICAL CUTTING SUPPLIES) ×1 IMPLANT
Blunt Guidewire ×16 IMPLANT
CLEANER ESURG TIP LCTRBRS HND_SWCH PNCL STRL DISP (CLEN) ×1
CLEANER ESURG TIP LCTRBRS PNCL HNDSWH STRL DISP (CLEN) ×1 IMPLANT
CNTNR POLYPROP 4OZ STL SCREW_ON LID LEK RST STRL SP LF (TUB) ×1
CONTAINR POLYPROP 4OZ STL SCREW ON LID LEAK RST STRL SPECI LF (TUB) ×1
CONV USE 153512 - CONTAINR POLYPROP 4OZ STL SCREW ON LID LEAK RST STRL SPECI LF (TUB) ×1 IMPLANT
CONV USE 338639 - PACK SURG CSTM SPINE NONST DISP LF (CUSTOM TRAYS & PACK) ×1
CONV USE 338639 - PACK SURG CUSTOM SPINE NONST DISP LF (CUSTOM TRAYS & PACK) ×1 IMPLANT
CONV USE 67669 - SYRINGE FLUSH SAL PRSV FR PREFL 10ML LL NDLS IV ACCESS SYS STRL LF (NEEDLES & SYRINGE SUPPLIES) ×1 IMPLANT
CONV USE ITEM 337890 - PACK SURG BSIN 2 STRL LF  DISP (CUSTOM TRAYS & PACK) ×1 IMPLANT
COVER WAND RFD STRL 50EA/CS_01-0020 (EQUIPMENT MINOR) ×1
COVER WND RF DETECT STRL CLR EQP (EQUIPMENT MINOR) ×1 IMPLANT
DCNTR FLUID 11IN SET CLAMP DEH_P STRL LF (IV TUBING & ACCESSORIES) ×1
DCNTR FLUID 9.2IN SET CLAMP STR TUBE N-PYRG DEHP STRL (IV TUBING & ACCESSORIES) ×1 IMPLANT
DISCONTINUED USE ITEM 91401 - SUTURE 0 UR-6 VICRYL 27IN VIOL BRD COAT ABS (SUTURE/WOUND CLOSURE) ×1 IMPLANT
DRAIN CHANNEL 15FR_JP2228 10/BX (Drains/Resovoirs) ×1
DRAIN INCS 15FR JP SIL HBLS CHNL STRL LF  30MM RND DISP WHT (Drains/Resovoirs) ×1 IMPLANT
DRAPE 2 LYR ABS 70X40IN MED UN_IV LF DISP SURG BILAMINATE (PROTECTIVE PRODUCTS/GARMENTS) ×1
DRAPE CARM FLRSCP EXPD CLPSBL C-ARMOR STRL EQP (DRAPE/PACKS/SHEETS/OR TOWEL) ×1 IMPLANT
DRAPE CARM FLRSCP EXPD CLPSBL_C-ARMOR STRL EQP (DRAPE/PACKS/SHEETS/OR TOWEL) ×1
DRAPE FNFLD SHEET 70X40IN MED PRXM LF  STRL DISP SURG SMS (PROTECTIVE PRODUCTS/GARMENTS) ×1 IMPLANT
DRAPE LEICA MICSCP 150X54IN EQ_P (DRAPE/PACKS/SHEETS/OR TOWEL) IMPLANT
DRESSING OIL EMUL NONADH 3X8IN 6113 STRL CURITY LF 24/BX (WOUND CARE SUPPLY) ×1 IMPLANT
DRESSING OIL EMUL NONADH 3X8IN 6113 STRL CURITY LF 24/BX (WOUND CARE/ENTEROSTOMAL SUPPLY) ×1
Elevate 28 x 11 cage ×1 IMPLANT
GAS CALIBRATE CDI A 2 PNT INT PRINTER COLOR LCD DSPL TONOMETER BLOOD PRMTR MONITOR SYS 500 CALIBRT (CYL) IMPLANT
GAS CALIBRATE CDI A 2 PNT INT_PRINTER COLOR LCD DSPL TNMTR (CYL)
GAUZE SURG 4X4IN RFDETECT RF A_SSURE COTTON 16 PLY XRY DTBL (WOUND CARE/ENTEROSTOMAL SUPPLY) ×1
GAUZE SURG 4X4IN STD RFDETECT COTTON 16 PLY XRY ABS LF  STRL DISP (WOUND CARE SUPPLY) ×1 IMPLANT
GOWN SURG LRG AAMI L3 NONREINF_ORCE SET IN SLEEVE HKLP CLSR (DGOW) ×1
GOWN SURG LRG L3 NONREINFORCE HKLP CLSR SET IN SLEEVE STRL LF  DISP BLU SIRUS SMS 43IN (DGOW) ×1 IMPLANT
GRAFT BONE 2X1IN 20MM INFS MED SPINE RHBMP-2 BVN CLGN ABS VIAL SYRG NEEDLE 5.6ML LUMB TAPER FUS  DEV (TISSUE/PREPARE) ×1 IMPLANT
GRAFT BONE READIGRAFT CANC 40M L 1-8MM PRESERVON CHP CRSH (TISSUE/PREPARE) ×1 IMPLANT
KIT INSTR SPINE MAZOR X DISP (ORTHOPEDICS (NOT IMPLANTS)) ×2 IMPLANT
KIT MAZOR X SCAN PLAN DISP (INSTRUMENTS) ×1
KIT POSITION GENTLETOUCH CUSH ARM CRDL FOAM HEEL PRTC THG SAF STRAP PATIENT CARE ANDW SPINAL TBL (PROTECTIVE PRODUCTS/GARMENTS) IMPLANT
KIT POSITION GENTLETOUCH CUSH_ARM CRDL FOAM HEEL PRTC THG (PROTECTIVE PRODUCTS/GARMENTS)
KIT SURG MAZOR X SCAN PLN DISP (SURGICAL INSTRUMENTS) ×1 IMPLANT
MILL BONE BIOPSY MED CRSE BLADE 5MM LF  DISP (SURGICAL CUTTING SUPPLIES) ×1 IMPLANT
MILL BONE BIOPSY MED CRSE BLD_5MM LF DISP (CUTTING ELEMENTS) ×1
PACK BASIN DBL CUSTOM (CUSTOM TRAYS & PACK) ×1
RESERVOIR DRAIN SIL JP BULB 100CC STRL LF  DISP (WOUND CARE SUPPLY) ×1 IMPLANT
RESERVOIR DRAIN SIL JP BULB 10_0CC STRL LF DISP (WOUND CARE/ENTEROSTOMAL SUPPLY) ×1
SCREW SET HORIZON BREAKOFF TI SPINE NONST 5.5 MM ROD ×6 IMPLANT
SOL IV 0.9% NACL 500ML PLASTIC CONTAINR VIAFLEX LF (SOLUTIONS) ×1 IMPLANT
SOLUTION IV NS INJ 500CC_2B1323Q 24/CS (SOLUTIONS) ×1
SPONGE GAUZE STRL 4 X 4IN TUB_6939 1280/CS (WOUND CARE SUPPLY) ×1 IMPLANT
SPONGE GAUZE STRL 4 X 4IN TUB_6939 1280/CS (WOUND CARE/ENTEROSTOMAL SUPPLY) ×1
STAPLER SKIN 4.1X6.5MM 35 W STPL CART LF  APS U DISP CLR SS PLASTIC (ENDOSCOPIC SUPPLIES) ×2 IMPLANT
STAPLER SKIN WIDE 35W_8886803712 12/BX (INSTRUMENTS ENDOMECHANICAL) ×2
SUTURE 0 UR-6 VICRYL 27IN VIOL_BRD COAT ABS (SUTURE/WOUND CLOSURE) ×1
SUTURE 2-0 CT1 VICRYL 27IN UNDYED BRD COAT ABS (SUTURE/WOUND CLOSURE) ×3 IMPLANT
SUTURE 2-0 CT1 VICRYL 27IN UND_YED BRD COAT ABS (SUTURE/WOUND CLOSURE) ×3
SUTURE 4-0 PC5 VICRYL MTPS 18I N UNDYED BRD COAT ABS (SUTURE/WOUND CLOSURE) ×4 IMPLANT
SYRINGE FLUSH SAL PRSV FR PREF_L 10ML LL NDLS IV ACCESS SYS (NEEDLES & SYRINGE SUPPLIES) ×1
Solera 80mm rod ×2 IMPLANT
Solera prebent rod 90mm ×1 IMPLANT
TAPE MICROFOAM 4IN 15284 BX/3 (MED/SURG TAPES) ×2 IMPLANT
TRAY SPINE CUSTOM (CUSTOM TRAYS & PACK) ×1

## 2017-06-09 NOTE — OR PostOp (Signed)
Patient arrival to 5N pacu . Follows commands. Moderate hand grasps and pedal pushes equal. Correctly visualizes # of fingers held up. Complains of back and right shoulder pain. See mar for meds given.

## 2017-06-09 NOTE — Brief Op Note (Signed)
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   Post-Operative Diagnosis: Same  Procedure(s)/Description:  L3-4 decompression/TLIF, L3-S1 PSIF  Findings: See dictation     Attending Surgeon: Shea EvansEmery  Assistant(s): Pernell DupreAdams (fellow)    Anesthesia Type: General  Estimated Blood Loss:  300 ml  Blood Given: None  Fluids Given: See anesthesia record  Complications (unintended/unexpected/iatrogenic/accidental/inadvertent events):  None  Characteristic Event (routinely expected or inherent to the difficulty/nature of the procedure): See Dictated Op Report  Did the use of current and/or prior Anticoagulants impact the outcome of the case? no  Wound Class: Clean Wound: Uninfected operative wounds in which no inflammation occurred    Tubes: None  Drains: Hemovac  Specimens/ Cultures: None  Implants: Medtronic           Disposition: PACU - hemodynamically stable.  Condition: stable    --  Lavone OrnSam Brax Walen, MD  Fellow  Department of Orthopaedics  [phone]

## 2017-06-09 NOTE — OR PreOp (Signed)
Pre-op nursing assessment complete.

## 2017-06-09 NOTE — Anesthesia Postprocedure Evaluation (Signed)
Anesthesia Post Op Evaluation    Patient: Courtney [name]  Procedure(s) with comments:  DECOMPRESSION SPINE LUMBAR POSTERIOR 2 LEVELS - REVISION L4-S1, Ext. Fusion to L3, TLIF, ROBOT, Medtronic  FUSION SPINE LUMBAR MAZOR X ROBOTIC ASSISTED 3 LEVELS OR MORE - REVISION L4-S1, Ext. Fusion to L3, TLIF, ROBOT, Medtronic    Last Vitals:Temperature: 37.1 C (98.8 F) (06/09/17 2227)  Heart Rate: 76 (06/09/17 2227)  BP (Non-Invasive): 113/68 (06/09/17 2227)  Respiratory Rate: 16 (06/09/17 2227)  SpO2-1: 96 % (06/09/17 2200)  Pain Score (Numeric, Faces): 4 (06/09/17 2200)  Patient is sufficiently recovered from the effects of anesthesia to participate in the evaluation and has returned to their pre-procedure level.  Patient location during evaluation: PACU   Post-procedure handoff checklist completed    Patient participation: complete - patient participated  Level of consciousness: awake and alert and responsive to verbal stimuli  Pain management: adequate  Airway patency: patent  Anesthetic complications: no  Cardiovascular status: acceptable  Respiratory status: acceptable  Hydration status: acceptable  Patient post-procedure temperature: Pt Normothermic   PONV Status: Absent

## 2017-06-09 NOTE — Progress Notes (Signed)
[place] Department of Orthopaedics  Spine Service  Attending: mery  Progress Note  06/09/2017    Name: Courtney [name]  DOB: 02/13/1966  MRN: [id number]    SUBJECTIVE:  51 y.o. female resting in bed. No complaints. Pain controlled.    OBJECTIVE:  Filed Vitals:    06/09/17 1050 06/09/17 2011 06/09/17 2015 06/09/17 2030   BP:  (!) 146/87 139/83 138/86   Pulse: 70 76 73 74   Resp: 16 (!) 11 14 12    Temp: 36.2 C (97.2 F) 36.8 C (98.2 F)  37.3 C (99.1 F)   SpO2: 100% 99% 100% 100%       GEN - NAD, resting in bed  Spine exam:  MOTOR     HF Q TA EHL G  Right 5 5 5 5 5   Left 5 5 5 5 5     SENSORY     L2 L3 L4 L5 S1  Right 2 2 1 1 2   Left 2 2 2 2 2         ASSESSMENT:  51 y.o. female Day of Surgery s/p L3-4 decompression/TLIF, L3-S1 PSIF    PLAN:  - Weightbearing: WBAT  - PT/OT: ordered; recommend pending  - Collar: no  - DVT prophylaxis: ambulate  - Antibiotics: kefzol x3  - Micro: no  - Pain: po   - Drain: yes; monitor output  - Dressing: pod 3  - Foley: no  - Labs: pod 1  - IVF: til good PO  - Imaging:   - Diet: DM  - Nursing instructions: check for pended/held orders upon arrival to floor  - Dispo: pending  - Follow-up: will see back in Dr. Mendel CorningEmery's clinic on 2 weeks. Order placed in Epic.    Lavone OrnSam Takerra Lupinacci, MD  Spine Fellow  Orthopedics  [phone]

## 2017-06-09 NOTE — OR PostOp (Signed)
Patients husband updated by pacu nurse, Reizy Dunlow RN and patient spoke with her husband via spectra link phone. Husband waiting in room 7ne #21. Voice care placed at this time.

## 2017-06-09 NOTE — H&P (Signed)
[facility]  H & P UPDATE                                                      Courtney Chihuahuaontorno, Courtney [name], 51 y.o. female  Date of Admission:  06/09/2017  Date of Birth:  03/31/1966    Date:  06/09/2017    STOP: IF H&P IS GREATER THAN 30 DAYS FROM SURGICAL DAY COMPLETE NEW H&P IS REQUIRED.    Outpatient Pre-Surgical H & P updated the day of the procedure.  1.  H&P the patient has been examined, and no change has occured in the patients condition since the H&P was completed.       Change in medications: No      Last Menstrual Period: N/A      Comments: na      2.  Patient continues to be appropiate candidate for planned surgical procedure. YES      Arta SilenceSanford Clayburn Weekly, MD  06/09/2017, 12:46

## 2017-06-09 NOTE — Nurses Notes (Signed)
Pt arrived to 7NE21 via bed from PACU s/p L3-4 decompression/TLIF, L3-S1 PSIF. Pt rating pain 4/10 and having intermittent nausea. Assessment per flow sheet. Pt oriented to room and unit rountine. Pt placed on bed alarm d/t high fall risk.

## 2017-06-09 NOTE — Anesthesia Transfer of Care (Signed)
ANESTHESIA TRANSFER OF CARE   Courtney BloodgoodLisa Michelle [name] is a 51 y.o. ,female, Weight: 83.4 kg (183 lb 13.8 oz)   had Procedure(s) with comments:  DECOMPRESSION SPINE LUMBAR POSTERIOR 2 LEVELS - REVISION L4-S1, Ext. Fusion to L3, TLIF, ROBOT, Medtronic  FUSION SPINE LUMBAR MAZOR X ROBOTIC ASSISTED 3 LEVELS OR MORE - REVISION L4-S1, Ext. Fusion to L3, TLIF, ROBOT, Medtronic  performed  06/09/17   Primary Service: Arta SilenceSanford Emery, MD    Past Medical History:   Diagnosis Date   . Asthma     childhood, no inhalers since   . Depression    . Eczema     hands x 1 year- eczema- seen derm   . Esophageal reflux     controlled   . Fibromyalgia    . Heart murmur    . Hx of transfusion     denies rx   . Hyperlipidemia    . Hypertension    . Hypothyroid    . Nausea with vomiting    . Pre-diabetes    . Shortness of breath    . Sleep apnea     doesn't wear CPAP    . Thyroid disease    . Wears glasses       Allergy History as of 06/09/17     MORPHINE       Noted Status Severity Type Reaction    06/08/17 1506 Rhodia AlbrightVaglienti, Julia, RN 08/30/14 Active Low  Swelling    08/30/14 1056 Faith RogueMcClure, Brittany 08/30/14 Active             NITROFURANTOIN       Noted Status Severity Type Reaction    06/08/17 1506 Rhodia AlbrightVaglienti, Julia, RN 08/30/14 Active High  Shortness of Breath    08/30/14 1056 Faith RogueMcClure, Brittany 08/30/14 Active             OTHER       Noted Status Severity Type Reaction    06/08/17 1515 Rhodia AlbrightVaglienti, Julia, RN 06/08/17 Active       Comments:  Patient states an anti nausea medication caused her to "keep throwing up" after receiving it. She doesn't remember the name of this medications but states "Zofran sounds familiar"                I completed my transfer of care / handoff to the receiving personnel during which we discussed:  Access, Airway, All key/critical aspects of case discussed, Analgesia, Antibiotics, Expectation of post procedure, Fluids/Product and Labs                                              Additional Info:Pt transported to PACU  on O2. Alert and in stable condition. Report given to RN                      Last OR Temp: Temperature: 36.8 C (98.2 F)  ABG:   Airway:  EndoTracheal Tube (Active)     Blood pressure (!) 146/87, pulse 76, temperature 36.8 C (98.2 F), resp. rate (!) 11, height 1.626 m (5\' 4" ), weight 83.4 kg (183 lb 13.8 oz), SpO2 99 %.

## 2017-06-10 ENCOUNTER — Encounter (HOSPITAL_COMMUNITY): Payer: Self-pay

## 2017-06-10 LAB — CBC WITH DIFF
BASOPHIL #: 0.02 10*3/uL (ref 0.00–0.20)
BASOPHIL %: 0 %
BASOPHIL %: 0 %
EOSINOPHIL #: 0 10*3/uL (ref 0.00–0.50)
EOSINOPHIL %: 0 %
EOSINOPHIL %: 0 %
HCT: 33.1 % — ABNORMAL LOW (ref 33.5–45.2)
HCT: 33.1 % — ABNORMAL LOW (ref 33.5–45.2)
LYMPHOCYTE #: 0.71 10*3/uL — ABNORMAL LOW (ref 1.00–4.80)
LYMPHOCYTE %: 5 %
MCH: 28.6 pg (ref 27.4–33.0)
MCV: 86.5 fL (ref 78.0–100.0)
MONOCYTE #: 0.25 10*3/uL — ABNORMAL LOW (ref 0.30–1.00)
MONOCYTE %: 2 %
NEUTROPHIL #: 14.51 10*3/uL — ABNORMAL HIGH (ref 1.50–7.70)
NEUTROPHIL %: 94 %
PLATELETS: 236 10*3/uL (ref 140–450)
RBC: 3.83 10*6/uL (ref 3.63–4.92)
RDW: 13.3 % (ref 12.0–15.0)
WBC: 15.5 10*3/uL — ABNORMAL HIGH (ref 3.5–11.0)

## 2017-06-10 LAB — BASIC METABOLIC PANEL
ANION GAP: 6 mmol/L (ref 4–13)
BUN/CREA RATIO: 18 (ref 6–22)
BUN: 12 mg/dL (ref 8–25)
CHLORIDE: 107 mmol/L (ref 96–111)
CO2 TOTAL: 24 mmol/L (ref 22–32)
ESTIMATED GFR: >59 mL/min/{1.73_m2} (ref >59)
GLUCOSE: 164 mg/dL — ABNORMAL HIGH (ref 65–139)
POTASSIUM: 4.2 mmol/L (ref 3.5–5.1)
SODIUM: 137 mmol/L (ref 136–145)

## 2017-06-10 LAB — POC BLOOD GLUCOSE (RESULTS)
GLUCOSE, POC: 179 mg/dl — ABNORMAL HIGH (ref 70–105)
GLUCOSE, POC: 113 mg/dl — ABNORMAL HIGH (ref 70–105)
GLUCOSE, POC: 134 mg/dl — ABNORMAL HIGH (ref 70–105)
GLUCOSE, POC: 109 mg/dl — ABNORMAL HIGH (ref 70–105)

## 2017-06-10 MED ORDER — OXYCODONE-ACETAMINOPHEN 5 MG-325 MG TABLET
1.0000 | ORAL_TABLET | ORAL | Status: DC | PRN
Start: 2017-06-10 — End: 2017-06-11

## 2017-06-10 MED ORDER — OXYCODONE-ACETAMINOPHEN 5 MG-325 MG TABLET
2.0000 | ORAL_TABLET | ORAL | Status: DC | PRN
Start: 2017-06-10 — End: 2017-06-11
  Administered 2017-06-10 – 2017-06-11 (×6): 2 via ORAL
  Filled 2017-06-10 (×8): qty 2

## 2017-06-10 MED ORDER — PROCHLORPERAZINE EDISYLATE 10 MG/2 ML (5 MG/ML) INJECTION SOLUTION
10.0000 mg | INTRAMUSCULAR | Status: AC
Start: 2017-06-10 — End: 2017-06-10
  Administered 2017-06-10: 09:00:00 10 mg via INTRAVENOUS
  Filled 2017-06-10: qty 2

## 2017-06-10 MED ADMIN — carvediloL 6.25 mg tablet: @ 11:00:00

## 2017-06-10 NOTE — Progress Notes (Signed)
[place] Department of Orthopaedics  Spine Service  Attending: mery  Progress Note  06/10/2017    Name: Courtney [name]  DOB: 08/19/1965  MRN: [id number]    SUBJECTIVE:  51 y.o. female resting in chair. No complaints. Pain controlled this AM, changed to percocet o/n    OBJECTIVE:  Filed Vitals:    06/09/17 2200 06/09/17 2227 06/10/17 0247 06/10/17 0359   BP: (!) 133/98 113/68 101/65    Pulse: 72 76 73    Resp: 16 16 16     Temp: 36.9 C (98.4 F) 37.1 C (98.8 F) 36.9 C (98.4 F)    SpO2: 96% 97%  93%   Drain sanguinous    GEN - NAD, resting in chair  Spine exam:  MOTOR     HF Q TA EHL G  Right 5 5 5 5 5   Left 5 5 5 5 5     SENSORY     L2 L3 L4 L5 S1  Right 2 2 1 1 2   Left 2 2 2 2 2       ASSESSMENT:  51 y.o. female 1 Day Post-Op s/p L3-4 decompression/TLIF, L3-S1 PSIF    PLAN:  - Weightbearing: WBAT  - PT/OT: ordered; recommend pending  - DVT prophylaxis: ambulate  - Antibiotics: kefzol x3  - Pain: po   - Drain: yes; monitor output  - Dressing: pod 3  - Labs: reviewed  - IVF: til good PO  - Diet: DM  - Dispo: pending  - Follow-up: will see back in Dr. Mendel CorningEmery's clinic on 2 weeks. Order placed in Epic.    Lavone OrnSam Stevana Dufner, MD  Spine Fellow  Orthopedics  [phone]

## 2017-06-10 NOTE — Care Plan (Signed)
Patient resting in bed throughout the night. POC reviewed with patient.  Goal- Improve Ambulation- Ambulate in halls 150 feet. OOB in chair for meals. Up ad lib with husband  Goal- Pain rated 5/10 or less. Elevation and Ice. PRN and SC Pain medications.  Goal- Maintain O2 SATS 92% or higher- Supplemental O2 if needed.   Goal- DVT Prevention- SCDs and frequent ROM.  Goal- Prevent skin Breakdown- Frequent reposition and supportive dressings utilized.   Goal- Fall Prevention- Frequent rounding, including checking the five Ps. Sitter Select refused.  Plan to d/c to home tomorrow as long as drain is pulled. Nausea improving after receiving one time dose of compazine. Pain improved with percocet compared to Norco. Call bell within reach. POC reviewed with patient. Will continue to monitor.

## 2017-06-10 NOTE — Care Management Notes (Signed)
[facility] Management Initial Evaluation    Patient Name: Courtney [name]  Date of Birth: September 10, 1965  Sex: female  Date/Time of Admission: 06/09/2017  9:50 AM  Room/Bed: 21/A  Payor: ACORDIA PEIA / Plan: PEIA/HEALTHSMART / Product Type: Non Managed Care /   PCP: Milas Kocher, DO    Pharmacy Info:   Preferred Pharmacy     [id number] [place], [place]   Phone: [phone]: [phone]   Not a 24 hour pharmacy; exact hours not known        Emergency Contact Info:   Extended Emergency Contact Information  Primary Emergency Contact: WSteward Ros Address: [address]           [place] [address]: [phone] Work Phone: [phone] Mobile Phone: [phone] Relation: Significant other  Secondary Emergency Contact: SNEDEGAR, JEFFREY   [place]: [phone] Relation: Son    History:   LMychael Sootsis a 51y.o., female, admitted s/p L3-4 decompression/TLIF, L3-S1 PSIF    Height/Weight: 160 cm ('5\' 3"'$ ) / 83.4 kg (183 lb 13.8 oz)     LOS: 1 day   Admitting Diagnosis: Lumbar stenosis [M48.061]    Assessment:      06/10/17 0928   Assessment Details   Assessment Type Admission   Date of Care Management Update 06/10/17   Date of Next DCP Update 06/12/17   Readmission   Is this a readmission? No   Care Management Plan   Discharge Planning Status initial meeting   Projected Discharge Date 06/11/17   CM will evaluate for rehabilitation potential yes   Patient choice offered to patient/family no   Form for patient choice reviewed/signed and on chart no   Discharge Needs Assessment   Equipment Currently Used at Home shower chair;walker, standard   Equipment Needed After Discharge none   Discharge Facility/Level of Care Needs Home (Patient/Family Member/other)(code 1)   Transportation Available car;family or friend will provide   Referral Information    Admission Type inpatient   Address Verified verified-no changes   Arrived From home or self-care   Insurance Verified verified-no change   ADVANCE DIRECTIVES   Does the Patient have an Advance Directive? No, Information Offered and Refused   Patient Requests Assistance in Having Advance Directive Notarized. N/A   Employment/Financial   Patient has Prescription Coverage?  Yes   Financial Concerns none   Living Environment   Select an age group to open "lives with" row.  Adult   Lives With spouse   Living Arrangements house   Able to Return to Prior Arrangements yes   HAldenAccessibility stairs to enter home   Legal Issues   Do you have a court appointed guardian/conservator? No   Patient Hand-Off   Clinical/Discharge Plan of Care Information Communicated to:  Clinical Care Coordinator   Living Environment   Number of Stairs to Enter Home 7   Met w pt and husband at bedside, pt is s/p L3-4 decompression/TLIF, L3-S1 PSIF, pt lives w husband in a house w 7 STE and a rail, can stay on 1 level w a w/i shower, uses a shower chair and walker, pt can provide transport and assist, DVT - ambulation, dispo - per pt, home tomorrow.     Discharge  Plan:  Home (Patient/Family Member/other) (code 1)      The patient will continue to be evaluated for developing discharge needs.     Case Manager: Elly [place], [place] COORDINATOR  Phone: [phone]

## 2017-06-10 NOTE — Care Plan (Signed)
Goal  Pain control <5/10  Remain free of falls  Ambulate 150'    Pt has rested intermittently during this shift. Pt has struggled to maintain a tolerable pain level after PRN medications and repositioning. Pt has been educated and remains compliant with fall risk precautions. Pt has ambulated short distances to the restroom but has not worked w/PT yet.   Adult Inpatient Plan of Care  Patient-Specific Goal (Individualization)  06/10/2017 0519 by Jobe GibbonHoover, Flannery Cavallero Thomas, RN  Flowsheets  Taken 06/10/2017 [phone]   Anxieties, Fears or Concerns  Getting up to walk

## 2017-06-10 NOTE — Nurses Notes (Signed)
Text paged service "7NE21 [place]: Patient states PRN Norco not relieving pain. No other PRN's available. [phone]"

## 2017-06-10 NOTE — Care Plan (Signed)
[facility]  Rehabilitation Services  Physical Therapy Initial Evaluation    Patient Name: Courtney BloodgoodLisa Michelle [name]  Date of Birth: 03/10/1966  Height: Height: 160 cm (5\' 3" )  Weight: Weight: 83.4 kg (183 lb 13.8 oz)  Room/Bed: 21/A  Payor: [employer] PEIA / Plan: PEIA/HEALTHSMART / Product Type: Non Managed Care /     Assessment:      Pt tolerated PT eval well demonstrating functional mobility with min/CGA. Pt amb with FWW x 200' and negotiated 3 steps. Pt presents with impairments in trunk ROM and strength, balance and mobility. PT recommends home with assist once medically stable for DC.    Discharge Needs:    Equipment Recommendation: none anticipated     Discharge Disposition: home with assist    JUSTIFICATION OF DISCHARGE RECOMMENDATION   Based on current diagnosis, functional performance prior to admission, and current functional performance, this patient requires continued PT services in home with assist in order to achieve significant functional improvements in these deficit areas: aerobic capacity/endurance, gait, locomotion, and balance, muscle performance.        Plan:   Current Intervention: balance training, bed mobility training, gait training, home exercise program, patient/family education, stair training, stretching, transfer training  To provide physical therapy services minimum of 4x/week  for duration of until discharge.    The risks/benefits of therapy have been discussed with the patient/caregiver and he/she is in agreement with the established plan of care.       Subjective & Objective        06/10/17 1110   Therapist Pager   PT Assigned/ Pager # Fleet Contrasachel [phone]   Rehab Session   Document Type evaluation   Total PT Minutes: 1110   Patient Effort good   General Information   Patient Profile Reviewed? yes   Onset of Illness/Injury or Date of Surgery 06/09/17   Pertinent History of Current Functional Problem 51 y.o. female 1 Day Post-Op s/p L3-4 decompression/TLIF, L3-S1 PSIF   Medical Lines  PIV Line;Peripheral Drain   Respiratory Status room air   Existing Precautions/Restrictions fall precautions;full code   Mutuality/Individual Preferences   Individualized Care Needs OOB with A x 1 using FWW   Living Environment   Lives With spouse   Living Arrangements house   Home Accessibility stairs to enter home   Number of Stairs to Enter Home 7   Functional Level Prior   Ambulation 0 - independent   Transferring 0 - independent   Toileting 0 - independent   Bathing 0 - independent   Dressing 0 - independent   Self-Care   Equipment Currently Used at Home walker, rolling;cane, straight   Pre Treatment Status   Pre Treatment Patient Status Patient supine in bed;Call light within reach;Telephone within reach;Sitter select activated;Nurse approved session   Cognitive Assessment/Interventions   Behavior/Mood Observations alert;cooperative;lethargic   Orientation Status oriented x 4   Attention WNL/WFL   Follows Commands WNL   Pain Assessment   Pain Scale: Numbers, Pretreatment 8/10   Pain Scale: Numbers, Post-Treatment 8/10   Pain Location - Orientation lower   Pain Location back   RLE Assessment   RLE Assessment WFL- Within Functional Limits   LLE Assessment   LLE Assessment WFL- Within Functional Limits   Trunk Assessment   Trunk Assessment X-Exceptions   Trunk ROM limited secondary to pain   Trunk Strength decreased   Mobility Assessment/Training   Additional Documentation Stairs Assessment/Treatment (Group)   Bed Mobility Assessment/Treatment   Bed Mobility, Assistive  Device Head of Bed Elevated   Supine-Sit Independence minimum assist (75% patient effort)   Sit to Supine, Independence minimum assist (75% patient effort)   Safety Issues impaired trunk control for bed mobility   Impairments pain;flexibility decreased;strength decreased   Transfer Assessment/Treatment   Sit-Stand Independence contact guard assist   Stand-Sit Independence contact guard assist   Sit-Stand-Sit, Assist Device front wheeled walker    Transfer Safety Issues step length decreased   Transfer Impairments balance impaired;endurance;pain   Gait Assessment/Treatment   Independence  stand-by assistance   Assistive Device  rolling walker   Distance in Feet 200   Total Distance Ambulated 200   Deviations  cadence decreased;step length decreased   Safety Issues  step length decreased   Impairments  balance impaired;endurance;pain   Stairs Assessment/Treatment   Number of Stairs 3   Handrail Location both sides   Independence Level stand-by assistance   Technique Used step over step (ascending);step over step (descending)   Impairments balance impaired;endurance;pain   Balance Skill Training   Comment with FWW   Sitting Balance: Static good balance   Sitting, Dynamic (Balance) fair + balance   Sit-to-Stand Balance fair balance   Standing Balance: Static fair balance   Standing Balance: Dynamic fair - balance   Systems Impairment Contributing to Balance Disturbance musculoskeletal   Identified Impairments Contributing to Balance Disturbance pain;decreased strength   Therapeutic Exercise/Activity   Comment attempted to instruct pt on Logroll technique but pt reported she could not do that and had husband assist pt   Post Treatment Status   Post Treatment Patient Status Patient supine in bed;Call light within reach;Telephone within reach   Support Present Post Treatment  Family present;Clinical assistant present   Plan of Care Review   Plan Of Care Reviewed With patient;spouse   Physical Therapy Clinical Impression   Assessment Pt tolerated PT eval well demonstrating functional mobility with min/CGA. Pt amb with FWW x 200' and negotiated 3 steps. Pt presents with impairments in trunk ROM and strength, balance and mobility. PT recommends home with assist once medically stable for DC.   Patient/Family Goals Statement return home safely   Criteria for Skilled Therapeutic skilled treatment is necessary   Pathology/Pathophysiology Noted musculoskeletal    Impairments Found (describe specific impairments) aerobic capacity/endurance;gait, locomotion, and balance;muscle performance   Functional Limitations in Following  self-care;home management   Disability: Inability to Perform community/leisure   Rehab Potential good, to achieve stated therapy goals   Therapy Frequency minimum of 4x/week   Predicted Duration of Therapy Intervention (days/wks) until discharge   Anticipated Equipment Needs at Discharge (PT) none anticipated   Anticipated Discharge Disposition home with assist   Highest level of Mobility score   Exercise Level 7- Walked 25 feet or more   Evaluation Complexity Justification   Patient History: Co-morbity/factors that Impact Plan of Care 1-2 that impact Plan of Care   Examination Components 4 or more Exam elements addressed   Presentation Stable: Uncomplicated, straight-forward, problem focused   Clinical Decision Making Low complexity   Evaluation Complexity Low complexity   Care Plan Goals   PT Rehab Goals Bed Mobility Goal;Gait Training Goal;Physical Therapy Goal   Physical Therapy Goal   PT  Goal, Date Established 06/10/17   PT Goal, Time to Achieve by discharge   PT Goal, Activity Type increase standing balance to good   Bed Mobility Goal   Bed Mobility Goal, Date Established 06/10/17   Bed Mobility Goal, Time to Achieve by discharge  Bed Mobility Goal, Activity Type all bed mobility activities   Bed Mobility Goal, Independence Level stand-by assistance   Gait Training  Goal, Distance to Achieve   Gait Training  Goal, Date Established 06/10/17   Gait Training  Goal, Time to Achieve by discharge   Gait Training  Goal, Independence Level modified independence   Gait Training  Goal, Assist Device least restricted assistive device   Gait Training  Goal, Distance to Achieve 300   Planned Therapy Interventions, PT Eval   Planned Therapy Interventions (PT) balance training;bed mobility training;gait training;home exercise program;patient/family  education;stair training;stretching;transfer training       Therapist:   Lise Auer, PT   Pager #: [phone]

## 2017-06-10 NOTE — Care Plan (Signed)
[facility]  Occupational Therapy Initial Evaluation    Patient Name: Courtney [name]  Date of Birth: [birth year]  Height: Height: 160 cm (_0 )  Weight: Weight: 83.4 kg (183 lb 13.8 oz)  Room/Bed: 21/A  Payor: [employer] PEIA / Plan: PEIA/HEALTHSMART / Product Type: Non Managed Care /     Assessment:    Pt tolerating OT session fairly well, initialy very reluctant to participate in mobility, but able to ambulate 100 ft with FWW demonstrating mild instability. UB dressing demo'ed with minA but dependent for LB dressing. Pt mainly limited by decreased activity tolerance, coordination and strength as well as lower back pain at this time. Anticipate pt will be safe to d/c home with assist from spouse when medically ready.       Discharge Needs:   Equipment Recommendation: none anticipated    Discharge Disposition:  home with assist    JUSTIFICATION OF DISCHARGE RECOMMENDATION   Based on current diagnosis, functional performance prior to admission, and current functional performance, this patient requires continued OT services in (P) home with assist  in order to achieve significant functional improvements.    Plan:   Current Intervention: (P) ADL retraining, bed mobility training, strengthening, transfer training, endurance training    To provide Occupational therapy services (P) minimum of 3x/week, 1x/day, (P) until discharge.       The risks/benefits of therapy have been discussed with the patient/caregiver and he/she is in agreement with the established plan of care.       Subjective & Objective        06/10/17 1109   Therapist Pager   OT Assigned/ Pager # Daneil Dan [phone]   Rehab Session   Document Type evaluation   Total SLP Minutes: 10   Patient Effort good   Symptoms Noted During/After Treatment fatigue;increased pain   General Information   Patient Profile Reviewed? yes   Onset of Illness/Injury or Date of Surgery 06/09/17   Pertinent History of Current Functional Problem 51 y.o.  female 1 Day Post-Op s/p L3-4 decompression/TLIF, L3-S1 PSIF   Medical Lines PIV Line;Peripheral Drain   Respiratory Status room air   Existing Precautions/Restrictions fall precautions;full code   Pre Treatment Status   Pre Treatment Patient Status Patient supine in bed;Call light within reach;Telephone within reach;Sitter select activated;Venodynes in place and activated   Support Present Pre Treatment  Family present   Mutuality/Individual Preferences   Individualized Care Needs OOB with assist x1 using [place]   Lives With spouse   Living Arrangements house   Home Accessibility stairs to enter home   Number of Stairs to [place] will be availible to assist as needed upon d/c.    Functional Level Prior   Ambulation 0 - independent   Transferring 0 - independent   Toileting 0 - independent   Bathing 0 - independent   Dressing 0 - independent   Eating 0 - independent   Communication 0 - understands/communicates without difficulty   Swallowing 0-->swallows foods/liquids without difficulty   Prior Functional Level Comment Reports being completely independent at baseline, not using any adaptive equipment. Has a walk-in shower at home and FWW and cane but does not use them.    Pain Assessment   Pain Scale: Numbers, Pretreatment 8/10   Pain Scale: Numbers, Post-Treatment 8/10   Pain Location - Orientation lower   Pain Location back   Coping/Psychosocial   Observed Emotional  State accepting;cooperative   Family/Support System   Family/Support Persons spouse   Involvement in Care attentive to patient   Coping/Psychosocial Response Interventions   Plan Of Care Reviewed With patient;spouse   Cognitive Assessment/Interventions   Behavior/Mood Observations alert   Orientation Status oriented x 4   Attention WNL/WFL   Follows Commands WNL   Comment Initially very reluctant to participate in any OOB activity due to apprehension about pain.    RUE Assessment   RUE  Assessment WFL- Within Functional Limits   LUE Assessment   LUE Assessment WFL- Within Functional Limits   RLE Assessment   RLE Assessment WFL- Within Functional Limits   LLE Assessment   LLE Assessment WFL- Within Functional Limits   Mobility Assessment/Training   Mobility Comment Ambulating 200 ft using FWW with SBA for safety, also completing stair training with PT. Mild instability shown, pt mainly limited by pain during today's session. See PT note for additional details.    Bed Mobility Assessment/Treatment   Bed Mobility, Assistive Device Head of Bed Elevated   Supine-Sit Independence minimum assist (75% patient effort)   Sit to Supine, Independence minimum assist (75% patient effort)   Impairments pain;flexibility decreased;coordination impaired;endurance   Comment for upwards elevation of trunk and lifting B LE's back into bed. Attempted to educate pt on logroll technique for bed mobility but pt stating very frankly " i can't do that."   Transfer Assessment/Treatment   Sit-Stand Independence contact guard assist   Stand-Sit Independence contact guard assist   Sit-Stand-Sit, Assist Device front wheeled walker   Upper Body Dressing Assessment/Training   Comment Able to don gown over back with minA.    Lower Body Dressing Assessment/Training   Comment Dependent to adjust socks at this time due to pain, decreased flexiblity and coordination.    Toileting Assessment/Training   Comment Denying any toileting needs during today's session .   Balance Skill Training   Comment with FWW    Sitting Balance: Static good balance   Sitting, Dynamic (Balance) fair + balance   Sit-to-Stand Balance fair balance   Standing Balance: Static fair balance   Standing Balance: Dynamic fair - balance   Identified Impairments Contributing to Balance Disturbance pain;decreased strength   Post Treatment Status   Post Treatment Patient Status Patient supine in bed;Call light within reach;Telephone within reach   Support Present Post  Treatment  Family present;Clinical assistant present   Care Plan Goals   OT Rehab Goals Occupational Therapy Goal;Toileting Goal;LB Dressing Goal;UB Dressing Goal   Occupational Therapy Goals   OT Goal, Date Established 06/10/17   OT Goal, Time to Achieve by discharge   OT Goal, Activity Type Pt will demonstrate improved overall balance and stability during transfer and functional mobility activities.    OT Goal, Independence Level independent   LB Dressing Goal   LB Dressing Goal, Date Established 06/10/17   LB Dressing Goal, Time to Achieve by discharge   LB Dressing Goal, Activity Type all lower body dressing tasks   LB Dressing Goal, Independence Level modified independence   Toileting Goal   Toileting Goal, Date Established 06/10/17   Toileting Goal, Time to Achieve by discharge   Toileting Goal, Activity Type all toileting tasks   Toileting Goal, Independence Level independent   UB Dressing Goal   UB Dressing  Goal, Date Established 06/10/17   UB Dressing Goal, Time to Achieve by discharge   UB Dressing Goal, Activity Type all upper body dressing tasks   UB Dressing  Goal, Independence Level modified independence   Planned Therapy Interventions, OT Eval   Planned Therapy Interventions ADL retraining;bed mobility training;strengthening;transfer training;endurance training   Occupational Therapy Clinical Impression   Functional Level at Time of Session Pt tolerating OT session fairly well, initialy very reluctant to participate in mobility, but able to ambulate 100 ft with FWW demonstrating mild instability. UB dressing demo'ed with minA but dependent for LB dressing. Pt mainly limited by decreased activity tolerance, coordination and strength as well as lower back pain at this time. Anticipate pt will be safe to d/c home with assist from spouse when medically ready.    Criteria for Skilled Therapeutic Interventions Met (OT) yes   Rehab Potential good, to achieve stated therapy goals   Therapy Frequency minimum  of 3x/week;1x/day   Predicted Duration of Therapy until discharge   Anticipated Equipment Needs at Discharge none anticipated   Anticipated Discharge Disposition home with assist   Evaluation Complexity Justification   Occupational Profile Review Expanded review   Performance Deficits 3-5 deficits;Pain;Mobility;Balance;Endurance;Coordination   Clinical Decision Making Moderate analytic complexity   Evaluation Complexity Moderate       Therapist:   Luella Cook, OT   Pager #: [phone]

## 2017-06-10 NOTE — Nurses Notes (Signed)
Pt BS 179 pt has 3 units Humalog ordered PRN. Pt refusing at this time. Educated pt on proper blood glucose management in relation to post operative healing. Pt continues to refuse at this time.

## 2017-06-10 NOTE — OR Surgeon (Signed)
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    NAME OF PROCEDURES:  1. Lumbar decompression L3-L4 with subtotal laminectomy at L3, partial medial facetectomies L3-L4 bilaterally.  2. L3-L4 transforaminal lumbar interbody fusion with expandable Elevate cage (Medtronic), plus local autograft.  3. L3-S1 posterior instrumentation and bone grafting with Medtronic pedicle screw instrumentation, plus BMP, plus local autograft plus allograft.  4. Use of O-arm and Mazor robot for instrumentation placement.    ANESTHESIA:  General.    SURGEON:  Arta SilenceSanford Amdrew Oboyle, MD.    ASSISTANT:  Alleen BorneSamuel Adams, MD.    INDICATIONS FOR PROCEDURE:  The patient is a 51 year old female approximately 33 years status post L4 to the sacrum bone grafting for high-grade L5-S1 spondylolisthesis.  She had no instrumentation in place.  She did well, but more recently developed severe stenosis adjacent to the old fusion at L3-L4.  She is here now for lumbar decompression and extension of her fusion.    DESCRIPTION OF PROCEDURE:  After satisfactory general anesthesia, the patient was placed in [place]-Wells tongs and carefully turned prone on a Jackson table.  All bony prominences were well padded.  The back was prepped and draped in the usual fashion. Then, 1:500,000 epinephrine and saline was injected into the subcutaneous tissues.  A longitudinal incision was made and the old scar was excised in the lumbar spine.  We carried this down to expose the spinous processes of L2, L3 and the remnant of L4.  We then dissected down distally through the midline scar and then posterolaterally over the fusion mass.  An x-ray was obtained to ensure the appropriate levels.  We  dissected out the transverse processes of L3 bilaterally and the fusion mass at L4 bilaterally.  Bleeding was controlled with cautery.  We then did our decompression.  We removed more than three-quarters of the spinous process of L3 but left the interconnecting ligaments to L2.  We removed this with a rongeur.  We thinned the lamina with a rongeur and then with a bur.  We did partial medial facetectomies with a bur.  We thinned the midline to get to epidural fat.  We then carefully removed the hypertrophic ligamentum flavum and the bony lamina.  We did partial medial facetectomies to eliminate all lateral recess stenosis.  We then, on the patient's right side, removed the inferior facet of L3 and much of the superior facet of L4 to totally expose that disk space.  The L3 nerve root was totally visible and protected.  We gently retracted the dura over toward the midline.  We incised the anulus of the disk.  We scooped out a lot of disk material as she had a nice plumb disk.  We used curettes of various kinds and pituitary rongeurs to remove all this.  We used a rasp as well.  We distracted this up to a 12 and probably could have gone larger, but we wanted to use an elevated cage to try and get her lordotic.  We packed bone graft anteriorly.  This was her local bone from her decompression.  We then tapped a Medtronic Elevate expandable cage, which was packed with local autograft as well, into the disk space.  We then elevated this to the appropriate torque amount to gain her some lordosis there.  The cage was countersunk nicely and had a nice fill of the disk space and a very snug fit.  We then brought in the O-arm.  We did a spin and synced it with the Mazor robot.  We then placed pedicle screws into L3 bilaterally, L4 bilaterally, and S1 bilaterally.  We did the planning preoperatively and then reproduced it intraoperatively after the O-arm scan.  It was very helpful in placing our pedicle screws appropriately.  The  robot allowed us to drill into the appropriate pedicles, place guidewires, and then tap over these.  We then placed cannulated screws over these and checked their final placement with bilateral fluoroscopy.  They were quite satisfactory.  We then decorticated the transverse processes of L3 and the old fusion mass at L4.  We packed BMP wrapped around allograft bits over in the gutters.  We then covered this with local autograft plus 20 mL of allograft chips on each side for a copious amount of bone.  We placed our rods, which we contoured to the appropriate lordosis, and secured these with caps.  We used the Medtronic lumbar pedicle screw system.  We had irrigated multiple times throughout the procedure.  We checked to be sure no bone had fallen over the dura.  There were no dural tears.  We did a final tightening and checked final images.  We then put Marcaine in the muscle.  We placed vancomycin powder in the wound.  We placed a deep soft suction drain.  We closed in layers using #1 Vicryl in the fascia, 2-0 Vicryl in the subcu and staples in skin.  The wound was dressed, and the patient was returned to the recovery room in satisfactory condition.  The estimated blood loss was 500 mL.  The duration of procedure was 5 hours.     Dr. Lavone OrnSam Adams served as first assistant for the entire case as there were no residents available.  He helped with the entire exposure, decompression and instrumentation plus reconstruction.        Arta SilenceSanford Salman Wellen, MD  Professor and Chair   [facility]                 DD:  06/09/2017 [id number]  DT:  06/10/2017 01:30:44 MK  D#:  [id number]

## 2017-06-10 NOTE — Nurses Notes (Signed)
Patient refusing to take any of her morning medications d/t nausea. Offered patient prn zofran, patient refused zofran, patient stated "Zofran and Reglan I have had before and all they do is make me throw up more."     Spoke with PA Zomcik regarding nausea. PA Zomcik placed orders for a one time dose of compazine to be given now. Will administer and monitor.

## 2017-06-11 LAB — POC BLOOD GLUCOSE (RESULTS): GLUCOSE, POC: 116 mg/dl — ABNORMAL HIGH (ref 70–105)

## 2017-06-11 MED ORDER — OXYCODONE-ACETAMINOPHEN 5 MG-325 MG TABLET
2.00 | ORAL_TABLET | ORAL | 0 refills | Status: DC | PRN
Start: 2017-06-11 — End: 2017-11-17

## 2017-06-11 MED ORDER — OXYCODONE-ACETAMINOPHEN 5 MG-325 MG TABLET
1.00 | ORAL_TABLET | ORAL | 0 refills | Status: DC | PRN
Start: 2017-06-11 — End: 2017-11-17

## 2017-06-11 MED ADMIN — sodium chloride 0.9 % intravenous solution: @ 10:00:00 | NDC 00338004904

## 2017-06-11 NOTE — Care Plan (Signed)
Pt able to rest adequate amounts in between care. Pt is up with standby assistance and a FWW. WBAT. Dressing to mid lower back is C/D/I. JP draining per doc flow sheets and pt is hopeful that it will be pulled today. SCDs maintained. Pt stated the numbness and tingling is improving compared to before surgery. Denied N/V overnight. PT/OT recommending home with assist. Possible D/C to home today pending drain removal. POC reviewed with pt.     Adult Inpatient Plan of Care  Patient-Specific Goal (Individualization)  06/11/2017 0144 - Ongoing (see interventions/notes) by Hermelinda DellenPaul, Jezebelle Ledwell Hope, RN  Flowsheets  Taken 06/11/2017 0144   Patient-Specific Goals (Include Timeframe)  wants to go home today and hopes the drain is pulled early this morning

## 2017-06-11 NOTE — Nurses Notes (Signed)
Paged MD Pernell DupreAdams, patient wondering what pain medication she will be sent home on. Informed patient that there is a prescription for Norco. Patient stated" I cannot take that, its what made me sick this surgery." Patient wondering if she can get her prescription changed to Percocet since that has been what she is receiving here. MD to page someone else since he is in surgery.

## 2017-06-11 NOTE — Nurses Notes (Signed)
Per policy, pt is rating a moderate fall risk on the JHFRAT. Pt is refusing to be placed on sitter select for safety. Pt educated on importance of safety and preventing falls but continues to refused. Pt educated on call bell system and importance of calling for assistance prior to getting out of bed. Pt compliant with calling for assistance. Call bell within reach. Will monitor.

## 2017-06-11 NOTE — Nurses Notes (Signed)
Patient and significant other given AVS and reviewed.They verbalized understanding of follow-up appointment,care of the incision,restrictions related to her surgery,signs of infection and use of medication ordered.They had no further questions.She was D/C by wheelchair with her significant other.

## 2017-06-11 NOTE — Progress Notes (Signed)
[facility] Department of Orthopaedics  Spine Service  Attending: mery  Progress Note  06/11/2017    Name: Nevin BloodgoodLisa Michelle Barcellos  DOB: 08/25/1965  MRN: [id number]    SUBJECTIVE:  51 y.o. female resting in bed. No complaints. Drain removed    OBJECTIVE:  Filed Vitals:    06/10/17 2324 06/11/17 0055 06/11/17 0245 06/11/17 0551   BP: 102/65  114/72    Pulse: 83  82    Resp: 16  16    Temp: 36.9 C (98.4 F)  37 C (98.6 F)    SpO2:  94%  95%       GEN - NAD, resting in chair  Spine exam:  MOTOR     HF Q TA EHL G  Right 5 5 5 5 5   Left 5 5 5 5 5     SENSORY     L2 L3 L4 L5 S1  Right 2 2 2 2 2   Left 2 2 2 2 2       ASSESSMENT:  51 y.o. female 2 Days Post-Op s/p L3-4 decompression/TLIF, L3-S1 PSIF    PLAN:  - Weightbearing: WBAT  - PT/OT: ordered; recommend home  - DVT prophylaxis: ambulate  - Pain: po   - Drain: removed  - Dressing: pod 3  - Diet: DM  - Dispo: pending  - Follow-up: will see back in Dr. Mendel CorningEmery's clinic on 2 weeks. Order placed in Epic.    Lavone OrnSam Kalli Greenfield, MD  Spine Fellow  Orthopedics  [phone]

## 2017-06-12 NOTE — Discharge Summary (Signed)
[facility]  DISCHARGE SUMMARY    PATIENT NAME:  Courtney [name], Courtney [name]  MRN:  [id number]  DOB:  05/10/1966    ENCOUNTER DATE:  06/09/2017  INPATIENT ADMISSION DATE: 06/09/2017  DISCHARGE DATE:  06/11/17    ATTENDING PHYSICIAN: Arta SilenceSanford Aryannah Mohon, MD  SERVICE[facility]  PRIMARY CARE PHYSICIAN: Amy Collene LeydenM Goff, DO     PRIMARY DISCHARGE DIAGNOSIS:   Active Hospital Problems    Diagnosis Date Noted    Lumbar stenosis 01/15/2017      Resolved Hospital Problems   No resolved problems to display.     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      Details   * oxyCODONE-acetaminophen 5-325 mg Tablet  Commonly known as:  PERCOCET   1-2 Tabs, Oral, EVERY 4 HOURS PRN  Qty:  84 Tab  Refills:  0     * oxyCODONE-acetaminophen 5-325 mg Tablet  Commonly known as:  PERCOCET   2 Tabs, Oral, EVERY 4 HOURS PRN  Qty:  84 Tab  Refills:  0     sennosides-docusate sodium 8.6-50 mg Tablet  Commonly known as:  SENOKOT-S   1 Tab, Oral, EVERY EVENING  Qty:  30 Tab  Refills:  0         * This list has 2 medication(s) that are the same as other medications prescribed for you. Read the directions carefully, and ask your doctor or other care provider to review them with you.            CONTINUE these medications - NO CHANGES were made during your visit.      Details   buPROPion 300 mg Tablet Sustained Release 24 hr  Commonly known as:  WELLBUTRIN XL   300 mg, Oral, EVERY MORNING  Refills:  0     estradiol 0.5 mg Tablet  Commonly known as:  ESTRACE   0.5 mg, Oral, DAILY  Refills:  0     fluocinolone 0.025 % Cream  Commonly known as:  SYNALAR   Apply Topically,  2 TIMES DAILY  Refills:  0     gemfibrozil 600 mg Tablet  Commonly known as:  LOPID   600 mg, Oral, 2 TIMES DAILY BEFORE MEALS  Refills:  0     levothyroxine 50 mcg Tablet  Commonly known as:  SYNTHROID   50 mcg, Oral, DAILY  Refills:  0     losartan 50 mg Tablet  Commonly known as:  COZAAR   50 mg, Oral, DAILY  Refills:  0     metFORMIN 500 mg Tablet  Commonly known as:  GLUCOPHAGE   500 mg, Oral, DAILY  Refills:  0     MILK THISTLE ORAL   Oral  Refills:  0     pantoprazole 40 mg Tablet, Delayed Release (E.C.)  Commonly known as:  PROTONIX   40 mg, Oral, DAILY PRN  Refills:  0     Vitamin D 1,000 unit Capsule  Generic drug:  Cholecalciferol (Vitamin D3)   Oral  Refills:  0  Discharge med list refreshed?  YES    ALLERGIES:  Allergies   Allergen Reactions    Macrodantin [Nitrofurantoin] Shortness of Breath    Other      Patient states an anti nausea medication caused her to "keep throwing up" after receiving it. She doesn't remember the name of this medications but states "Zofran sounds familiar"     Morphine Swelling             HOSPITAL PROCEDURE(S):   Bedside Procedures:  No orders of the defined types were placed in this encounter.    Surgical Procedure(s):  DECOMPRESSION SPINE LUMBAR POSTERIOR 2 LEVELS  FUSION SPINE LUMBAR MAZOR X ROBOTIC ASSISTED 3 LEVELS OR MORE    REASON FOR HOSPITALIZATION AND HOSPITAL COURSE     BRIEF HPI:  This is a 51 y.o., female admitted for L3-4 decompression/TLIF, L3-S1 PSIF    BRIEF HOSPITAL NARRATIVE: who underwent a L3-4 decompression/TLIF, L3-S1 PSIF with Dr. Shea EvansEmery on 06/09/17.    Post-operative Summary:  Course: Unremarkable post-op course  Antibiotics: x24 hours post-op  Foley: None  Drain: removed POD#2  Dressing: changed on POD#2  Wound: c/d/i, no signs of infection  PT/OT: pt appropriate for d/c to Home  Additional Consults: None      CONDITION ON DISCHARGE:   General condition: pain controlled, tolerating PO intake, passing gas  Follow-up: Pt will f/u in 2 wks  with Dr. Shea EvansEmery  Please review attached discharge instructions and call with any questions or concerns.      CONDITION ON DISCHARGE:  A. Ambulation: Ambulation with assistive device  B. Self-care Ability: Complete  C. Cognitive Status Alert and Oriented x 3  D. Code status at discharge:   Code Status Information     Code Status    Prior                 LINES/DRAINS/WOUNDS AT DISCHARGE:   Patient Lines/Drains/Airways Status    Active Line / Dialysis Catheter / Dialysis Graft / Drain / Airway / Wound     None                DISCHARGE DISPOSITION:  Home discharge              DISCHARGE INSTRUCTIONS:  Follow-up Information     [facility] .    Specialty:  Orthopaedics  Contact information:  [address]  [place] [id number]  [phone]  Additional information:  For driving directions to the [facility] in [place], please call [phone] or [phone]. You may also visit our website at [email or web address].                                                DISCHARGE INSTRUCTION - MISC    DISCHARGE INSTRUCTIONS:  Thoracic or Lumbar Fusion    Wound Care:  Leave the dressing on your surgical wound for 48 hours after surgery. After that time the dressing may be removed.  If the wound is clean and dry (no drainage), you may leave it uncovered, although it may be preferable to keep a clean, dry dressing on the wound to prevent clothing from rubbing against it.  Please have your wound checked twice daily for any signs of infection. If any of the below occur, please call the office:  Drainage from the incision  Opening  of the incision  Fevers greater than 101 degrees (Fahrenheit)  Flu-like symptoms  Increased redness and/or tenderness  DO NOT put any lotions, creams, or ointments on your wound  If you have staples or sutures closing your incision, these will be removed at your follow-up visit in 2-3 weeks. Please do not cut the suture material.     Showering:  You may shower after 48  hours if the wound is clean and dry. You may let water run over the wound, but please do not scrub over it.   Hair washing is permissible when in the shower.   DO NOT soak/submerge the wound (Bath tub, hot tub, swimming pool, etc.) until 6 weeks after surgery (unless otherwise instructed).   If the wound is draining at all, do not shower.     Activity:  You may walk and climb stairs as tolerated. Walking outside (in nice weather only) or walking on a treadmill (no incline) is also allowed. Walk as much as possible, letting your discomfort be your guide.  DO NOT lift anything weighing more than 10 pounds.  DO NOT bend or twist at the waist; always bend with your knees  Limit your sitting to 20-30 minute intervals. You should lie down or walk around in between sitting periods. There are no limitations on sitting in a recliner chair.  You may sleep in any position which makes you comfortable. It is normal to have a little difficulty sleeping in the first few weeks following your surgery.    Brace:  If you have been given a brace, you may wear it as needed for comfort. Please wear a shirt under the brace so it is not rubbing directly against your skin. You should remove the brace for showering.        Driving:  You may not drive a car until told otherwise by your surgeon.   You may be a passenger for short distances (30-40 minutes). If you must take a longer trip, make several stops so that you can walk around and stretch your legs. Reclining the passenger seat may be more comfortable.    Pain:  You will be given pain medication at the time of discharge. Take it only as prescribed. As your pain decreases, you may decrease the amount of pain medication you take.  DO NOT take anti-inflammatory medications (NSAIDs such as ibuprofen, naproxen, Advil, Aleve, Motrin, etc.) for the first 12 weeks after surgery. Anti-inflammatory medications can interfere with spinal fusion healing.  To help alleviate persistent soreness around  a bone graft site (hip, if used), apply ice pack or warm moist compresses. If bone graft is used, it is normal to have some discomfort at the graft site for several weeks after surgery; the best thing is to keep walking to help strengthen the hip muscles  .  Follow-up Appointments:  A follow-up clinic appointment should be made for you approximately 2-3 weeks after your surgery. This is often already scheduled before you have surgery.  If you need to confirm your appointment, please call [phone].     Questions or Concerns:   If you have any additional questions or concerns, or if you develop any significant worsening of your symptoms (pain, numbness, tingling, weakness, or trouble controlling bowel/bladder), please call [phone] or go to the Emergency Department.     PLEASE KEEP FOLLOW-UP APPT ALREADY SCHEDULED IN [facility]THOPAEDICS SPINE-HW    Please keep your appointment with your follow-up provider. If for some reason you  need to cancel, be sure to reschedule as this is important for your care.     [facility] [[id number]]           Dalbert Garnet, PA-C      Copies sent to Care Team       Relationship Specialty Notifications Start End    Rose Phi, [place] PCP - General EXTERNAL  11/20/16     Phone: [phone] Fax: [phone]         [facility] [address] [facility] [address]          Referring providers can utilize [email or web address] to access their referred [facility] patient's information.

## 2017-06-15 NOTE — Addendum Note (Signed)
Addendum  created 06/15/17 0711 by Shona SimpsonJohnstone, Armentha Branagan E, MD    Intraprocedure Attestations filed

## 2017-06-17 ENCOUNTER — Encounter (INDEPENDENT_AMBULATORY_CARE_PROVIDER_SITE_OTHER): Payer: Self-pay | Admitting: Orthopaedic Surgery of the Spine

## 2017-07-01 ENCOUNTER — Ambulatory Visit
Payer: No Typology Code available for payment source | Attending: Orthopaedic Surgery of the Spine | Admitting: Orthopaedic Surgery of the Spine

## 2017-07-01 ENCOUNTER — Telehealth (INDEPENDENT_AMBULATORY_CARE_PROVIDER_SITE_OTHER): Payer: Self-pay | Admitting: Orthopaedic Surgery of the Spine

## 2017-07-01 DIAGNOSIS — M48061 Spinal stenosis, lumbar region without neurogenic claudication: Secondary | ICD-10-CM | POA: Insufficient documentation

## 2017-07-01 DIAGNOSIS — Z9689 Presence of other specified functional implants: Secondary | ICD-10-CM | POA: Insufficient documentation

## 2017-07-01 DIAGNOSIS — Z9889 Other specified postprocedural states: Secondary | ICD-10-CM | POA: Insufficient documentation

## 2017-07-01 DIAGNOSIS — Z4802 Encounter for removal of sutures: Secondary | ICD-10-CM | POA: Insufficient documentation

## 2017-07-01 DIAGNOSIS — Z4789 Encounter for other orthopedic aftercare: Secondary | ICD-10-CM

## 2017-07-01 DIAGNOSIS — Z981 Arthrodesis status: Secondary | ICD-10-CM

## 2017-07-01 NOTE — Telephone Encounter (Signed)
Message from [place] Fluharty sent at 07/01/2017 11:51 AM EST     Shea EvansEmery Pt / Last appt today: 1.2.19    Patient is calling to ask if she can have her return to work slip mailed to her - she did not get it from the front desk before she left.    Thanks!   Call History      Type Contact Phone User   07/01/2017 11:51 AM Phone (Incoming) Maravilla, Nevin BloodgoodLisa Michelle (Self) [phone] (H) Fluharty, Marni GriffonKerry       Jen - she said she wanted to go back next week I believe. Yep fusion precautions are great. Thanks, JZ 1/2 1320    [name]- she was seen today. Is she ok to return to work with the fusion restrictions? Or when did you say she could go back? Thanks, Candise BowensJen 1/2@1305     Attempted to return with no answer and message left. Works slip completed and mailed to her home address.  Durenda HurtJennifer Burns, RN  07/01/2017, 13:59

## 2017-07-01 NOTE — Progress Notes (Signed)
PATIENT NAME: Courtney [name], Courtney [name]  HOSPITAL NUMBER:  [id number]  DATE OF SERVICE: 07/01/2017  DATE OF BIRTH:  1966/02/26    PROGRESS NOTE    SUBJECTIVE:  Courtney [name] is now 3 weeks status post lumbar decompression above her remote L4 to the sacrum fusion.  We did a decompression at L3-L4 and extended her fusion to L3.  She is feeling great.  She has no leg symptoms at all.  She has some back stinging, but not much.  Her wound is well healed.  We removed the staples.  She is neurologically intact.    ASSESSMENT AND PLAN:  I think she looks great.  I will see her back in 2 months.  At that time, she needs an AP and lateral view of the lumbar spine and we should be able to get her on a good exercise program.        Courtney SilenceSanford Emery, MD  Professor and Chair   [place] Department of Orthopaedics               CC:   Amy Collene LeydenM Goff, DO   [facility]   [address]   [place] [id number]   Fax: [phone]       DD:  07/01/2017 11:12:46  DT:  07/01/2017 17:30:42 DW  D#:  [id number]

## 2017-07-01 NOTE — Patient Instructions (Signed)
Name of Operation:  1.          Lumbar decompression L3-L4 with subtotal laminectomy at L3, partial medial facetectomies L3-L4 bilaterally.  2.          L3-L4 transforaminal lumbar interbody fusion with expandable Elevate cage (Medtronic), plus local autograft.  3.          L3-S1 posterior instrumentation and bone grafting with Medtronic pedicle screw instrumentation, plus BMP, plus local autograft plus allograft.  4.          Use of O-arm and Mazor robot for instrumentation placement.

## 2017-07-02 ENCOUNTER — Telehealth (INDEPENDENT_AMBULATORY_CARE_PROVIDER_SITE_OTHER): Payer: Self-pay | Admitting: Orthopaedic Surgery of the Spine

## 2017-07-02 NOTE — Telephone Encounter (Signed)
Return call- Candise BowensJen   Received: Today   Message Contents   Clare Charonean, Matthew R  P Ortho Dr Shea EvansEmery Service         Message from Thurmon FairMatthew R Dean sent at 07/02/2017 8:15 AM EST     Summary: Return call- Courtney ShinglesJen     Emery Pt    Pt requesting return call from Boneta LucksJenny to confirm work slip dates  Thanks             Call History      Type Special educational needs teacherContact Phone User   07/02/2017 08:14 AM Phone (Incoming) Hermance, Nevin BloodgoodLisa Michelle (Self) [phone] Rexene Edison(H) Thurmon Fairean, Matthew R     Returned call to the patient and discussed the return to work date and restrictions. New slip mailed to her home address. She thanked me for the call back.  Durenda HurtJennifer Burns, RN  07/02/2017, 08:24

## 2017-07-27 ENCOUNTER — Telehealth (INDEPENDENT_AMBULATORY_CARE_PROVIDER_SITE_OTHER): Payer: Self-pay | Admitting: Orthopaedic Surgery of the Spine

## 2017-07-27 NOTE — Telephone Encounter (Signed)
Message from [place]a Hustus sent at 07/27/2017 12:38 PM EST     The patient would like to know if she is able to use a heating pad or an ice pack for her back. Also, the patient states that she has been experiencing a cramping type of pain in her back on the same side that she had surgery on. The patient would like for someone to give her a call back to discuss. Thanks!   Call History      Type Contact Phone User   07/27/2017 12:34 PM Phone (Incoming) Yadao, Nevin BloodgoodLisa Michelle (Self) [phone] (W) Hustus, Egbert GaribaldiKrista         Jen - nothing to add. Sounds like she is on track. JZ 1/28 1449  Called and spoke with the patient. She has a crampy pain on the right side. I discussed with her that she may use heat/ice 3 or 4 times a day (not to leave on for extended periods of time). She just started back to work and I advised she may have some muscle aches for some time since she is now back to work. No issues with the incision, BB or weakness. She will watch it for now and call if it persist or if she has other concerns. Anything to add? Thanks, Candise BowensJen 1/28 @ 1307

## 2017-08-31 ENCOUNTER — Encounter (INDEPENDENT_AMBULATORY_CARE_PROVIDER_SITE_OTHER): Payer: No Typology Code available for payment source | Admitting: Orthopaedic Surgery of the Spine

## 2017-10-05 ENCOUNTER — Ambulatory Visit
Payer: No Typology Code available for payment source | Attending: Orthopaedic Surgery of the Spine | Admitting: Orthopaedic Surgery of the Spine

## 2017-10-05 ENCOUNTER — Ambulatory Visit (HOSPITAL_BASED_OUTPATIENT_CLINIC_OR_DEPARTMENT_OTHER): Payer: No Typology Code available for payment source

## 2017-10-05 ENCOUNTER — Encounter (INDEPENDENT_AMBULATORY_CARE_PROVIDER_SITE_OTHER): Payer: Self-pay | Admitting: Orthopaedic Surgery of the Spine

## 2017-10-05 DIAGNOSIS — M545 Low back pain, unspecified: Secondary | ICD-10-CM

## 2017-10-05 DIAGNOSIS — M4317 Spondylolisthesis, lumbosacral region: Secondary | ICD-10-CM

## 2017-10-05 DIAGNOSIS — Z96642 Presence of left artificial hip joint: Secondary | ICD-10-CM

## 2017-10-05 DIAGNOSIS — Z9889 Other specified postprocedural states: Secondary | ICD-10-CM

## 2017-10-05 DIAGNOSIS — Z969 Presence of functional implant, unspecified: Secondary | ICD-10-CM | POA: Insufficient documentation

## 2017-10-06 NOTE — Progress Notes (Signed)
PATIENT NAME: Courtney [name], Courtney [name] [facility] NUMBER:  [id number]  DATE OF SERVICE: 10/05/2017  DATE OF BIRTH:  17-Jan-1966    PROGRESS NOTE    PRIMARY CARE PROVIDER:  Laurell Roof, DO.    REFERRING PROVIDER:  Melina Modena, MD.     Date of Surgery:  August 10, 2016.     Name of Procedure:  Lumbar decompression L3-L4 with L3 to S1 posterior instrumentation.    SUBJECTIVE:  The patient is a 52 year old Caucasian female who presents to clinic today 4 months status post the above-mentioned procedure.  The patient reports she has been doing well from a low back standpoint.  She occasionally has intermittent low back pain that appears to be activity related.  She has also recently started noticing cramping of the lower extremities, it does appear to be worse at night; however, does occur intermittently throughout the day.  She has been ambulating as much as tolerated with no significant difficulty.  In the past month or so the patient has begun to notice chest pain that occurs intermittently and seems to shoots through her chest to her back as well as shortness of breath.  The patient does have a strong cardiac family history, however, herself denies any previous heart attacks.  She has an appointment later this week to see her primary care provider.    OBJECTIVE:  On exam, the patient is alert and oriented, in no acute distress seated comfortably in the exam room chair, moves about the exam room without difficulty.  Respirations nonlabored with audible wheeze.  No cyanosis, clubbing, swelling of extremities.  No accessory muscle use on room air; 5/5 strength across the bilateral lower extremities TA, EHL, GS, Q, HS, HF, AB, AD.  Sensation is intact and equal L2 through S1.  Lumbar incision is well healed without signs of infection such as warmth, erythema or drainage.  Nontender to palpation.    IMAGING:  The patient had repeat AP and lateral lumbar films obtained and reviewed in clinic today alongside Dr. Shea Evans.   Per his interpretation, no overall changes in alignment.  No signs of hardware fatigue or breakdown.  With the elevate cage, there does appear to be a significant amount of lumbar lordosis.    ASSESSMENT AND PLAN:  We discussed with the patient we are pleased with how she is progressing out from surgery.  At this time, she can resume normal activities without restrictions including full work duty.  She was given a slip for this today.  We did discuss with her getting into her own exercise program including core strengthening and aerobic exercise.  She has a home gym and would like to do this on her own.  If she has any issues, she can contact us and we will then mail her a physical therapy script.  I discussed with the patient that if her shortness of breath and chest pain become worse, I would encourage her to go to the Emergency Department, as she may be having a heart attack or PE.  She voiced understanding and was agreeable.  We would like her to return to clinic in 8 months.  At that time, we need AP and lateral, flexion and extension lumbar films.        Adolm Joseph, PA-C    I personally saw and examined the patient. See physician's assistant note for additional details. Doing well.    Arta Silence, MD         Arta Silence,  MD  Professor and Chair   [place] Department of Orthopaedics               CC:   Melina ModenaJohn [name], MD     [facility] Louise ClimaxDilly, DO   [address]   [place], [place] [id number]       DD:  10/05/2017 14:20:39  DT:  10/06/2017 08:29:34 TP  D#:  [id number]

## 2017-11-17 ENCOUNTER — Encounter (INDEPENDENT_AMBULATORY_CARE_PROVIDER_SITE_OTHER): Payer: Self-pay | Admitting: Neurology

## 2017-11-17 ENCOUNTER — Ambulatory Visit: Payer: No Typology Code available for payment source | Attending: Neurology | Admitting: Neurology

## 2017-11-17 ENCOUNTER — Ambulatory Visit (INDEPENDENT_AMBULATORY_CARE_PROVIDER_SITE_OTHER): Payer: No Typology Code available for payment source | Admitting: Rheumatology

## 2017-11-17 VITALS — BP 124/60 | HR 72 | Ht 65.0 in | Wt 189.6 lb

## 2017-11-17 DIAGNOSIS — G629 Polyneuropathy, unspecified: Secondary | ICD-10-CM

## 2017-11-17 DIAGNOSIS — M542 Cervicalgia: Secondary | ICD-10-CM

## 2017-11-17 DIAGNOSIS — R51 Headache: Secondary | ICD-10-CM

## 2017-11-17 DIAGNOSIS — R519 Headache, unspecified: Secondary | ICD-10-CM

## 2017-11-17 DIAGNOSIS — M48061 Spinal stenosis, lumbar region without neurogenic claudication: Secondary | ICD-10-CM

## 2017-11-17 DIAGNOSIS — M4310 Spondylolisthesis, site unspecified: Secondary | ICD-10-CM | POA: Insufficient documentation

## 2017-11-17 DIAGNOSIS — R413 Other amnesia: Secondary | ICD-10-CM

## 2017-11-17 DIAGNOSIS — Z981 Arthrodesis status: Secondary | ICD-10-CM | POA: Insufficient documentation

## 2017-11-17 DIAGNOSIS — M5416 Radiculopathy, lumbar region: Secondary | ICD-10-CM | POA: Insufficient documentation

## 2017-11-17 DIAGNOSIS — E119 Type 2 diabetes mellitus without complications: Secondary | ICD-10-CM | POA: Insufficient documentation

## 2017-11-17 DIAGNOSIS — Z79899 Other long term (current) drug therapy: Secondary | ICD-10-CM | POA: Insufficient documentation

## 2017-11-17 LAB — CREATININE WITH EGFR
CREATININE: 0.66 mg/dL (ref 0.49–1.10)
ESTIMATED GFR: >59 mL/min/1.73mˆ2 (ref >59)

## 2017-11-17 LAB — VITAMIN B12
VITAMIN B 12: 1031 pg/mL — ABNORMAL HIGH (ref 200–1000)
VITAMIN B 12: 1031 pg/mL — ABNORMAL HIGH (ref 200–1000)

## 2017-11-17 LAB — THYROID STIMULATING HORMONE (SENSITIVE TSH): TSH: 0.646 u[IU]/mL (ref 0.350–5.000)

## 2017-11-17 LAB — BUN: BUN: 15 mg/dL (ref 8–25)

## 2017-11-17 NOTE — Progress Notes (Signed)
NEUROLOGY CLINIC, [facility]  Operated by [facility]  [place] [address]  Dept: [phone]  Dept Fax: [phone]    Return Outpatient Note    Date:  11/17/2017  Age:  52 y.o.  Referring Physician:   Self, Referral  No address on file    Subjective:   She says note from gw says she did not have GBS.  Feet are number.  Something chockes of food.  Also says  For getting names, neck hurts too. Balance trouble also   Objective:   Vital Signs:  BP 124/60   Pulse 72   Ht 1.651 m ( )   Wt 86 kg (189 lb 9.5 oz)   BMI 31.55 kg/m       Examination:  Exam reveals a healthy appearing patient.    Discs are flat. No APD.    No Bruit head or neck.    Orientation is normal.  Memory is normal.  Attention is normal.  Knowledge is normal.  Language is normal.  Speech is normal.    Cranial Nerves:  2-12 Normal              Gait Normal  Coordination Normal.  Sensory, pin and vibration  Decrease distally arms and legs   Tone normal.    Motor  Normal strength throughout  High arches  Reflexes  Decreased aj and kj  Toes down    Diabetic foot exam:  Decreased sensation bilaterally          Data Reviewed:    I have reviewed the following:   Patient Active Problem List    Diagnosis   . Lumbago   . Low back pain   . Lumbar radiculopathy   . Numbness and tingling   . S/P lumbar fusion   . Lumbar stenosis   . Spondylolisthesis, unspecified spinal region   . Tight posterior capsule of right shoulder       Assessment:    ICD-10-CM    1. Neuropathy (CMS HCC) G62.9 Tsh     Vitamin B12   2. Spondylolisthesis, unspecified spinal region M43.10    3. Lumbar radiculopathy M54.16    4. Diabetes (CMS HCC) E11.9    5. S/P lumbar fusion Z98.1    6. Lumbar stenosis M48.061    7. Neck pain M54.2 MRI BRAIN W/WO CONTRAST     MRI SPINE CERVICAL W/WO CONTRAST     Bun     Creatinine     Tsh   8. Headache R51 MRI BRAIN W/WO CONTRAST     MRI SPINE CERVICAL W/WO CONTRAST     Bun     Creatinine     Tsh   9. Memory loss  R41.3    10. Memory change R41.3 Tsh     Vitamin B12     MRI SPINE CERVICAL W/WO CONTRAST     Tsh       Orders  Orders Placed This Encounter   . MRI BRAIN W/WO CONTRAST   . MRI SPINE CERVICAL W/WO CONTRAST   . Tsh   . Vitamin B12   . Bun   . Creatinine   . Tsh     Current Outpatient Medications   Medication Sig   . ascorbic acid, vitamin C, (VITAMIN C) 500 mg Oral Tablet Take 500 mg by mouth Once a day   . calcium citrate-vitamin D3 (CITRACAL) 200 mg calcium -250 unit Oral Tablet Take by mouth Once a  day   . Cholecalciferol, Vitamin D3, (VITAMIN D) 1,000 unit Oral Capsule Take by mouth   . cyanocobalamin (VITAMIN B 12) 1,000 mcg Oral Tablet Take 1,000 mcg by mouth Twice daily   . DULoxetine (CYMBALTA DR) 30 mg Oral Capsule, Delayed Release(E.C.) Take 30 mg by mouth Once a day   . estradiol (ESTRACE) 0.5 mg Oral Tablet Take 0.5 mg by mouth Once a day   . ferrous sulfate (FERATAB) 324 mg (65 mg iron) Oral Tablet, Delayed Release (E.C.) Take 324 mg by mouth Every morning before breakfast   . fluocinolone (SYNALAR) 0.025 % Cream by Apply Topically route Twice daily   . gemfibrozil (LOPID) 600 mg Oral Tablet Take 600 mg by mouth Twice a day before meals   . levothyroxine (SYNTHROID) 50 mcg Oral Tablet Take 50 mcg by mouth Once a day   . losartan (COZAAR) 50 mg Oral Tablet Take 50 mg by mouth Once a day   . magnesium oxide (MAG-OX) 400 mg (241.3 mg magnesium) Oral Tablet Take 400 mg by mouth Twice daily   . metFORMIN (GLUCOPHAGE) 500 mg Oral Tablet Take 500 mg by mouth Once a day    . MILK THISTLE ORAL Take by mouth   . pantoprazole (PROTONIX) 40 mg Oral Tablet, Delayed Release (E.C.) Take 40 mg by mouth Once per day as needed        Plan  Try to get [facility] em[facility] and [facility] records   Total face-to-face time by staff:   minutes. Greater than 50% of that time was spent on counseling/coordination of care regarding:       Governor Rooks, M.D.  Professor   [facility] Department of Neurology

## 2017-11-18 ENCOUNTER — Encounter (INDEPENDENT_AMBULATORY_CARE_PROVIDER_SITE_OTHER): Payer: Self-pay

## 2017-11-27 ENCOUNTER — Ambulatory Visit (INDEPENDENT_AMBULATORY_CARE_PROVIDER_SITE_OTHER): Payer: Self-pay | Admitting: Neurology

## 2017-11-27 NOTE — Telephone Encounter (Signed)
Can't find it  Called pt and informed - she will try [phone]  She will send it now  Erie Noe, would you check??

## 2017-11-27 NOTE — Telephone Encounter (Signed)
Regarding: Fax  ----- Message from Glynda JaegerJaclyn N Foley sent at 11/27/2017 12:02 PM EDT -----      [facility] to [facility]- he had requested it from pt. Please make pt aware if received [phone].    Thanks [employer] PartnersJaclyn

## 2017-12-05 ENCOUNTER — Ambulatory Visit
Admission: RE | Admit: 2017-12-05 | Discharge: 2017-12-05 | Disposition: A | Discharge status: Home / Self Care | Payer: No Typology Code available for payment source | Source: Ambulatory Visit

## 2017-12-05 ENCOUNTER — Ambulatory Visit (INDEPENDENT_AMBULATORY_CARE_PROVIDER_SITE_OTHER)
Admission: RE | Admit: 2017-12-05 | Discharge: 2017-12-05 | Disposition: A | Discharge status: Home / Self Care | Payer: No Typology Code available for payment source | Source: Ambulatory Visit

## 2017-12-05 DIAGNOSIS — R51 Headache: Secondary | ICD-10-CM

## 2017-12-05 DIAGNOSIS — M542 Cervicalgia: Secondary | ICD-10-CM

## 2017-12-05 DIAGNOSIS — M47812 Spondylosis without myelopathy or radiculopathy, cervical region: Secondary | ICD-10-CM

## 2017-12-05 DIAGNOSIS — M5382 Other specified dorsopathies, cervical region: Secondary | ICD-10-CM

## 2017-12-05 DIAGNOSIS — M5021 Other cervical disc displacement,  high cervical region: Secondary | ICD-10-CM

## 2017-12-05 DIAGNOSIS — R519 Headache, unspecified: Secondary | ICD-10-CM

## 2017-12-05 DIAGNOSIS — R413 Other amnesia: Secondary | ICD-10-CM

## 2017-12-05 DIAGNOSIS — M50222 Other cervical disc displacement at C5-C6 level: Secondary | ICD-10-CM

## 2017-12-05 DIAGNOSIS — M4802 Spinal stenosis, cervical region: Secondary | ICD-10-CM

## 2017-12-05 DIAGNOSIS — M50223 Other cervical disc displacement at C6-C7 level: Secondary | ICD-10-CM

## 2017-12-05 DIAGNOSIS — I679 Cerebrovascular disease, unspecified: Secondary | ICD-10-CM

## 2017-12-05 MED ORDER — GADOBENATE DIMEGLUMINE 529 MG/ML(0.1 MMOL/0.2 ML) INTRAVENOUS SOLUTION
17.00 mL | INTRAVENOUS | Status: AC
Start: 2017-12-05 — End: 2017-12-05
  Administered 2017-12-05: 14:00:00 17 mL via INTRAVENOUS

## 2017-12-24 ENCOUNTER — Ambulatory Visit (INDEPENDENT_AMBULATORY_CARE_PROVIDER_SITE_OTHER): Payer: Self-pay | Admitting: Neurology

## 2017-12-24 NOTE — Telephone Encounter (Signed)
Regarding: [name]   ----- Message from Brett FairySamantha Leigh Younciak sent at 12/24/2017  1:44 PM EDT -----  Pt was calling regarding MRI results, states Dr. Quentin MullingBrick told her they would go over it on the phone. Pt also stating that she [facility] and wanted to make sure we received it. Pt can be reached at [phone] or [phone].

## 2017-12-24 NOTE — Telephone Encounter (Signed)
Called pt and lmom  We have not received mri report yet  Gave our fax number to pt

## 2017-12-24 NOTE — Telephone Encounter (Signed)
Pt called back - [facility] were done [facility] - please call and advise on results.  Pt mailed a different report from [facility] a couple weeks ago. Hopefully,will arrive soon

## 2017-12-30 ENCOUNTER — Encounter (INDEPENDENT_AMBULATORY_CARE_PROVIDER_SITE_OTHER): Payer: Self-pay | Admitting: Neurology

## 2018-01-04 ENCOUNTER — Ambulatory Visit (INDEPENDENT_AMBULATORY_CARE_PROVIDER_SITE_OTHER): Payer: Self-pay | Admitting: Neurology

## 2018-01-04 NOTE — Telephone Encounter (Signed)
Regarding: pt call  ----- Message from Glynda JaegerJaclyn N Foley sent at 01/04/2018  1:59 PM EDT -----      Pt is calling in and wanted to talk with the doctor about testing she had done- states last time you didn't have the results. Please call pt.  [phone]     Thanks Lockie ParesJaclyn

## 2018-01-04 NOTE — Telephone Encounter (Signed)
Mri results in computer

## 2018-01-04 NOTE — Telephone Encounter (Signed)
Tell her brain shows minimal microvasc changes, she used to smoke could be from that.  Neck shows significant degen changes that are pushing a little on her spinal cord.  Courtney ModenaJohn [name], MD  01/04/2018, 15:00

## 2018-01-05 NOTE — Telephone Encounter (Signed)
Called pt and informed

## 2018-01-07 ENCOUNTER — Encounter (INDEPENDENT_AMBULATORY_CARE_PROVIDER_SITE_OTHER): Payer: Self-pay | Admitting: Neurology

## 2018-01-18 ENCOUNTER — Encounter (INDEPENDENT_AMBULATORY_CARE_PROVIDER_SITE_OTHER): Payer: Self-pay | Admitting: Neurology

## 2018-01-18 ENCOUNTER — Ambulatory Visit: Payer: 59 | Attending: Neurology | Admitting: Neurology

## 2018-01-18 ENCOUNTER — Ambulatory Visit (INDEPENDENT_AMBULATORY_CARE_PROVIDER_SITE_OTHER): Payer: 59

## 2018-01-18 VITALS — BP 134/68 | HR 70 | Wt 188.7 lb

## 2018-01-18 DIAGNOSIS — R519 Headache, unspecified: Secondary | ICD-10-CM

## 2018-01-18 DIAGNOSIS — M542 Cervicalgia: Secondary | ICD-10-CM | POA: Insufficient documentation

## 2018-01-18 DIAGNOSIS — Z79899 Other long term (current) drug therapy: Secondary | ICD-10-CM | POA: Insufficient documentation

## 2018-01-18 DIAGNOSIS — R51 Headache: Secondary | ICD-10-CM

## 2018-01-18 DIAGNOSIS — Z981 Arthrodesis status: Secondary | ICD-10-CM | POA: Insufficient documentation

## 2018-01-18 DIAGNOSIS — E119 Type 2 diabetes mellitus without complications: Secondary | ICD-10-CM | POA: Insufficient documentation

## 2018-01-18 DIAGNOSIS — M47812 Spondylosis without myelopathy or radiculopathy, cervical region: Secondary | ICD-10-CM | POA: Insufficient documentation

## 2018-01-18 DIAGNOSIS — Z7984 Long term (current) use of oral hypoglycemic drugs: Secondary | ICD-10-CM | POA: Insufficient documentation

## 2018-01-18 NOTE — Progress Notes (Signed)
NEUROLOGY CLINIC, [facility]  Operated by [facility]  [address]  [place] [id number]  Dept: [phone]  Dept Fax: [phone]    Return Outpatient Note    Date:  01/18/2018  Age:  52 y.o.  Referring Physician:   No referring provider defined for this encounter.    Subjective:   More headaches and neck pain.  Thinks more trouble dorsiflex r foot   Objective:   Vital Signs:  BP 134/68   Pulse 70   Wt 85.6 kg (188 lb 11.4 oz)   SpO2 93%   BMI 31.40 kg/m       Examination:  Exam reveals a healthy appearing patient.    Discs are flat. No APD.    No Bruit head or neck.    Orientation is normal.  Memory is normal.  Attention is normal.  Knowledge is normal.  Language is normal.  Speech is normal.    Cranial Nerves:  2-12 Normal              Gait Normal  Coordination Normal.  Sensory, pin and vibration  Decrease feer   Tone normal.    Motor  Normal strength throughout    Reflexes  All hypoactive Toes down    Diabetic foot exam:  Decreased sensation bilaterally          Data Reviewed:    I have reviewed the following:   emg from [facility] [employer] reported to show a neuropathy ,  Mri cspine shows bowing of cord over bar   Patient Active Problem List    Diagnosis   . Lumbago   . Low back pain   . Lumbar radiculopathy   . Numbness and tingling   . S/P lumbar fusion   . Lumbar stenosis   . Spondylolisthesis, unspecified spinal region   . Tight posterior capsule of right shoulder       Assessment:    ICD-10-CM    1. Diabetes (CMS HCC) E11.9    2. Neck pain M54.2 Referral to Spine Center   3. Headache R51    4. 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 Referral to Spine Center     Current Outpatient Medications   Medication Sig   . ascorbic acid, vitamin C, (VITAMIN C) 500 mg Oral Tablet Take 500 mg by mouth Once a day   . calcium citrate-vitamin D3 (CITRACAL) 200 mg calcium -250 unit Oral Tablet Take by mouth Once a day   .  Cholecalciferol, Vitamin D3, (VITAMIN D) 1,000 unit Oral Capsule Take by mouth   . cyanocobalamin (VITAMIN B 12) 1,000 mcg Oral Tablet Take 1,000 mcg by mouth Twice daily   . DULoxetine (CYMBALTA DR) 30 mg Oral Capsule, Delayed Release(E.C.) Take 30 mg by mouth Once a day   . estradiol (ESTRACE) 0.5 mg Oral Tablet Take 0.5 mg by mouth Once a day   . ferrous sulfate (FERATAB) 324 mg (65 mg iron) Oral Tablet, Delayed Release (E.C.) Take 324 mg by mouth Every morning before breakfast   . fluocinolone (SYNALAR) 0.025 % Cream by Apply Topically route Twice daily   . gemfibrozil (LOPID) 600 mg Oral Tablet Take 600 mg by mouth Twice a day before meals   . levothyroxine (SYNTHROID) 50 mcg Oral Tablet Take 50 mcg by mouth Once a day   . losartan (COZAAR) 50 mg Oral Tablet Take 50  mg by mouth Once a day   . magnesium oxide (MAG-OX) 400 mg (241.3 mg magnesium) Oral Tablet Take 400 mg by mouth Twice daily   . metFORMIN (GLUCOPHAGE) 500 mg Oral Tablet Take 500 mg by mouth Once a day    . MILK THISTLE ORAL Take by mouth   . pantoprazole (PROTONIX) 40 mg Oral Tablet, Delayed Release (E.C.) Take 40 mg by mouth Once per day as needed        Plan  See dr Jana Half for neck.  rtc 4 months wth emg report she has from [facility]  Total face-to-face time by staff:   minutes. Greater than 50% of that time was spent on counseling/coordination of care regarding:       Governor Rooks, M.D.  Professor   [facility] Department of Neurology

## 2018-01-18 NOTE — Telephone Encounter (Signed)
Called pt and lmom

## 2018-01-18 NOTE — Telephone Encounter (Signed)
Courtney ModenaBrick, [name], MD               Tell her some arthritic changes in the cervical spine. Brain shows some mild micro vascular changeses Courtney ModenaJohn [name], MD 01/17/2018, 20:10

## 2018-05-03 ENCOUNTER — Encounter (INDEPENDENT_AMBULATORY_CARE_PROVIDER_SITE_OTHER): Payer: Self-pay | Admitting: Orthopaedic Surgery of the Spine

## 2018-05-03 ENCOUNTER — Ambulatory Visit: Payer: 59 | Attending: Surgical | Admitting: Orthopaedic Surgery of the Spine

## 2018-05-03 VITALS — BP 170/84 | HR 68 | Ht 63.0 in | Wt 190.7 lb

## 2018-05-03 DIAGNOSIS — M47812 Spondylosis without myelopathy or radiculopathy, cervical region: Secondary | ICD-10-CM

## 2018-05-03 DIAGNOSIS — M4802 Spinal stenosis, cervical region: Secondary | ICD-10-CM | POA: Insufficient documentation

## 2018-05-03 DIAGNOSIS — M542 Cervicalgia: Secondary | ICD-10-CM

## 2018-05-04 NOTE — Progress Notes (Signed)
PATIENT NAME: Courtney [name], Courtney [name] NUMBER:  [id number]  DATE OF SERVICE: 05/03/2018  DATE OF BIRTH:  25-Mar-1966    PROGRESS NOTE    CHIEF COMPLAINT:  Neck pain with radiation into the upper extremities.    SUBJECTIVE:  The patient is a 52 year old Caucasian female who presents to clinic today for the above-mentioned complaint.  The patient is well known to Dr. Shea Evans as he had done a lumbar decompression and fusion on her last December.  However, the patient comes in today with a new complaint of neck pain with radiation into the bilateral shoulders.  The patient states that it has been getting progressively worse over the past few years.  She had seen her neurologist, Dr. Quentin Mulling, who had ordered MRI of the brain and C-spine, had then referred her back to Dr. Shea Evans for further evaluation.  The patient admits to decreased grip strength and difficulty with fine motor skills.  She has numbness that radiates down the right hand into the ring and middle finger and is accompanied with a sharp, shooting pain if she is doing something repetitive with the right hand.  She is also right-hand dominant.  She feels her balance has been somewhat off; however, it does not appear to be getting progressively worse.  She had a fall 1 month ago whenever her foot had gotten stuck on the frame of the door.  The patient states that at night when she lies down the pain increases, with radiation into her shoulders.  She feels a fatigue sensation in the back of the neck.  The patient also has known diagnosis of fibromyalgia and has recently been taking Cymbalta, which she feels may mildly help.  She had therapy several years ago for her shoulder and more recently has been doing some of the exercises, however, has not had any formal physical therapy on her neck.  She uses ice which helps temporarily.  Her PCP had given her a prescription of tramadol which she takes very sparingly.  She can walk between 0.25 mile to a full mile  before needing to stop for rest and is using no assistive devices.  Of note, the patient does have diabetes, however, she takes metformin.  She also denies any significant low back pain and is almost 1 year out from surgery.  Denies any bowel or bladder dysfunction.  The patient admits to paresthesias across the upper extremities.  In the lower extremities they also are present, however, feel different.  In the lower extremities she describes them as feeling as if water is running down them.  In the upper extremities, it feels almost as if there is an insect crawling on her arm.    OBJECTIVE:  On exam, the patient is alert and oriented, in no acute distress, seated comfortably in the exam room chair.  She moves about the exam room without difficulty and has no difficulty completing tandem gait.  Across the upper extremities she is 5/5 bilaterally for intrinsics, grip, finger flexors, finger extensors, wrist flexors, wrist extensors, biceps, triceps, and deltoids.  Sensation is grossly intact C5 through T1.  The patient has 2+ left-sided biceps, triceps, patella, and Achilles reflex.  They are 1+ respectively on the right.  I had gotten a faint index Hoffmann, as well as a faint reverse brachioradialis.  However, it was not reproducible to Dr. Mendel Corning exam.  She has 2+ palpable radial pulses.  Brisk capillary refill of the bilateral middle digits.  Skin is warm and  well perfused.    IMAGING:  Cervical MRI is available internally showing that the patient does have some focal kyphosis from C4-C6.  At C5-C6 there does appear to be moderately severe central stenosis with cord deformation and loss of fluid signal around the cord at C6-C7 this is also present but to a more moderate degree.  Otherwise, mild degenerative changes diffusely throughout the cervical spine.  No other imaging studies of the C-spine are available.    ASSESSMENT AND PLAN:  I discussed with the patient at this time we would like to get her started in  physical therapy to work on the neck and shoulders.  We included for them to do gentle traction, isometrics and other modalities, as well as rotator cuff and scapular stabilizing exercises.  The patient can get plugged in physical therapy.  We can see her back in several weeks for her 1-year postop appointment on the low back.  At that visit, we will need AP lateral flexion-extension films of both the cervical and lumbar spine.  The patient can contact us in the meantime with questions or concerns.  Otherwise, she can return to clinic as previously scheduled.        Adolm Joseph, PA-C    I personally saw and examined the patient. See physician's assistant note for additional details. Has cervical stenosis, will try PT, may need something done in near future.    Arta Silence, MD         Arta Silence, MD  Professor and Chair   [place] Department of Orthopaedics               CC:   Burgess Estelle [place], DO   [address]   [place], [place] [address]       DD:  05/03/2018 13:54:39  DT:  05/04/2018 14:11:06 SP  D#:  [id number]

## 2018-06-07 ENCOUNTER — Encounter (INDEPENDENT_AMBULATORY_CARE_PROVIDER_SITE_OTHER): Payer: Self-pay | Admitting: Orthopaedic Surgery of the Spine

## 2018-06-07 ENCOUNTER — Ambulatory Visit (HOSPITAL_BASED_OUTPATIENT_CLINIC_OR_DEPARTMENT_OTHER): Payer: 59

## 2018-06-07 ENCOUNTER — Ambulatory Visit: Payer: 59 | Attending: Orthopaedic Surgery of the Spine | Admitting: Orthopaedic Surgery of the Spine

## 2018-06-07 DIAGNOSIS — M545 Low back pain, unspecified: Secondary | ICD-10-CM

## 2018-06-07 DIAGNOSIS — M4712 Other spondylosis with myelopathy, cervical region: Secondary | ICD-10-CM

## 2018-06-07 DIAGNOSIS — G9520 Unspecified cord compression: Secondary | ICD-10-CM | POA: Insufficient documentation

## 2018-06-07 DIAGNOSIS — M47892 Other spondylosis, cervical region: Secondary | ICD-10-CM

## 2018-06-07 DIAGNOSIS — M542 Cervicalgia: Secondary | ICD-10-CM | POA: Insufficient documentation

## 2018-06-07 DIAGNOSIS — M4802 Spinal stenosis, cervical region: Secondary | ICD-10-CM

## 2018-06-07 DIAGNOSIS — R200 Anesthesia of skin: Secondary | ICD-10-CM | POA: Insufficient documentation

## 2018-06-07 DIAGNOSIS — M79601 Pain in right arm: Secondary | ICD-10-CM | POA: Insufficient documentation

## 2018-06-07 DIAGNOSIS — M47816 Spondylosis without myelopathy or radiculopathy, lumbar region: Secondary | ICD-10-CM

## 2018-06-07 NOTE — Progress Notes (Signed)
To be dictated  Arta SilenceSanford Karys Meckley, MD 06/07/2018, 13:03

## 2018-06-08 NOTE — Progress Notes (Signed)
PATIENT NAME: Courtney [name], Courtney [name]  HOSPITAL NUMBER:  [id number]  DATE OF SERVICE: 06/07/2018  DATE OF BIRTH:  08-12-1965    PROGRESS NOTE    SUBJECTIVE:  Courtney [name] is here for follow up of her neck pain.  She has been going to physical therapy for 6 weeks now.  It has helped a little bit but she still has neck pain anywhere from a 4 to a 9/10.  She will get some pain going down the right arm and occasionally the left.  She will get numbness going into the right hand and sometimes the left.  This is mostly when she is driving.  She does not get night pain.  She has been worse with her neck since May 2019, so it has been about 7 months.    OBJECTIVE:  On exam, she has limited extension of her C-spine.  I cannot pick up any motor weakness in her uppers.  I tested her gait again and she really has basically normal toe to heel tandem gait.  Reflexes are 2+ and symmetric.  I do not get any Hoffmann's today.  She has a mildly positive Tinel's on the left today.  She has a Phalen's bilaterally.    IMAGING:  I reviewed with her, her cervical MRI and her plain films again.  She has some cord flattening at C5-C6 and C6-C7.  She has facet arthritis at C3-C4 and some at C4-C5 and a little bit at the other levels.  She is not unstable on flexion and extension.    ASSESSMENT AND PLAN:  We talked about the options here.  At some point she may come to surgery for this as she does have some cord compression at C5-C6 and C6-C7.  She has mostly neck pain symptomatically, however, with a little bit of some arm symptoms.  She would rather avoid surgery if possible.  We were going to try some facet blocks and I want them to try C3-C4 and C4-C5.  Her facets really look ratty up at C3-C4.  I will check her back in about 3 months and see how she is doing and reexamine her.        Arta SilenceSanford Emery, MD  Professor and Chair   [facility] Department of Orthopaedics               CC:   Burgess EstelleJamie Louise Rio ChiquitoDilly, DO   [address]   [place], [place]  [id number]     DD:  06/07/2018 14:07:14  DT:  06/08/2018 13:36:38 TP  D#:  [id number]

## 2018-06-10 ENCOUNTER — Ambulatory Visit (INDEPENDENT_AMBULATORY_CARE_PROVIDER_SITE_OTHER): Payer: 59 | Admitting: Neurology

## 2018-06-10 DIAGNOSIS — M48061 Spinal stenosis, lumbar region without neurogenic claudication: Secondary | ICD-10-CM

## 2018-06-10 DIAGNOSIS — E119 Type 2 diabetes mellitus without complications: Secondary | ICD-10-CM

## 2018-06-10 DIAGNOSIS — G629 Polyneuropathy, unspecified: Secondary | ICD-10-CM

## 2018-06-10 DIAGNOSIS — M542 Cervicalgia: Secondary | ICD-10-CM

## 2018-06-10 NOTE — Progress Notes (Signed)
See dict note  25 minute visit.  Most of time taken up counciling.   Melina ModenaJohn [name], MD  06/10/2018, 16:39

## 2018-06-16 ENCOUNTER — Ambulatory Visit (INDEPENDENT_AMBULATORY_CARE_PROVIDER_SITE_OTHER): Payer: Self-pay | Admitting: Neurology

## 2018-06-16 NOTE — Telephone Encounter (Signed)
R.brick i do not see any thing on this

## 2018-06-16 NOTE — Telephone Encounter (Signed)
Talked to pt and i faxed a referral form to dr.triplett at [phone]

## 2018-06-16 NOTE — Telephone Encounter (Signed)
Regarding: [name]  ----- Message from Brett FairySamantha Leigh Younciak sent at 06/16/2018 10:38 AM EST -----  Pt states she was to receive referral to Dr. Tacey RuizLeah Tripplett -rheumatologist. Pt can be reached at [phone].

## 2018-06-16 NOTE — Telephone Encounter (Signed)
Ask her where that dr is and we will ask her to see her.  I thought she said she already had the appt set up.  Courtney ModenaJohn [name], MD  06/16/2018, 12:46

## 2018-06-30 HISTORY — PX: HX CARPAL TUNNEL RELEASE: SHX101

## 2018-07-28 ENCOUNTER — Encounter (INDEPENDENT_AMBULATORY_CARE_PROVIDER_SITE_OTHER): Payer: Self-pay

## 2018-08-06 ENCOUNTER — Other Ambulatory Visit (INDEPENDENT_AMBULATORY_CARE_PROVIDER_SITE_OTHER): Payer: Self-pay | Admitting: Neurological Surgery

## 2018-08-13 ENCOUNTER — Inpatient Hospital Stay (INDEPENDENT_AMBULATORY_CARE_PROVIDER_SITE_OTHER)
Admission: RE | Admit: 2018-08-13 | Discharge: 2018-08-13 | Disposition: A | Discharge status: Home / Self Care | Payer: 59 | Source: Ambulatory Visit | Admitting: NEUROLOGY

## 2018-08-13 DIAGNOSIS — G629 Polyneuropathy, unspecified: Secondary | ICD-10-CM

## 2018-08-25 ENCOUNTER — Ambulatory Visit (INDEPENDENT_AMBULATORY_CARE_PROVIDER_SITE_OTHER): Payer: Self-pay | Admitting: Nurse Practitioner

## 2018-08-25 ENCOUNTER — Encounter (INDEPENDENT_AMBULATORY_CARE_PROVIDER_SITE_OTHER): Payer: Self-pay | Admitting: Neurology

## 2018-08-25 DIAGNOSIS — G5600 Carpal tunnel syndrome, unspecified upper limb: Secondary | ICD-10-CM

## 2018-09-06 ENCOUNTER — Encounter (INDEPENDENT_AMBULATORY_CARE_PROVIDER_SITE_OTHER): Payer: 59 | Admitting: Orthopaedic Surgery of the Spine

## 2018-09-27 ENCOUNTER — Ambulatory Visit (HOSPITAL_BASED_OUTPATIENT_CLINIC_OR_DEPARTMENT_OTHER): Payer: Self-pay | Admitting: SURGERY OF THE HAND

## 2018-10-04 ENCOUNTER — Encounter (INDEPENDENT_AMBULATORY_CARE_PROVIDER_SITE_OTHER): Payer: 59 | Admitting: Orthopaedic Surgery of the Spine

## 2018-11-02 ENCOUNTER — Other Ambulatory Visit: Payer: Self-pay

## 2018-11-15 ENCOUNTER — Ambulatory Visit (HOSPITAL_BASED_OUTPATIENT_CLINIC_OR_DEPARTMENT_OTHER): Payer: Self-pay | Admitting: SURGERY OF THE HAND

## 2018-11-29 ENCOUNTER — Encounter (INDEPENDENT_AMBULATORY_CARE_PROVIDER_SITE_OTHER): Payer: Self-pay | Admitting: Orthopaedic Surgery of the Spine

## 2018-11-29 ENCOUNTER — Other Ambulatory Visit: Payer: Self-pay

## 2018-11-29 ENCOUNTER — Ambulatory Visit (HOSPITAL_BASED_OUTPATIENT_CLINIC_OR_DEPARTMENT_OTHER): Payer: 59 | Admitting: SURGERY OF THE HAND

## 2018-11-29 ENCOUNTER — Ambulatory Visit: Payer: 59 | Attending: Orthopaedic Surgery of the Spine | Admitting: Orthopaedic Surgery of the Spine

## 2018-11-29 VITALS — BP 167/79 | HR 72 | Temp 98.2°F | Ht 64.61 in | Wt 200.8 lb

## 2018-11-29 DIAGNOSIS — G5603 Carpal tunnel syndrome, bilateral upper limbs: Principal | ICD-10-CM

## 2018-11-29 DIAGNOSIS — M25511 Pain in right shoulder: Secondary | ICD-10-CM

## 2018-11-29 DIAGNOSIS — G5600 Carpal tunnel syndrome, unspecified upper limb: Secondary | ICD-10-CM

## 2018-11-29 MED ORDER — METHYLPREDNISOLONE ACETATE 40 MG/ML SUSPENSION FOR INJECTION
40.0000 mg | Freq: Once | INTRAMUSCULAR | 0 refills | Status: AC
Start: 2018-11-29 — End: 2018-11-29

## 2018-11-29 MED ORDER — LIDOCAINE HCL 10 MG/ML (1 %) INJECTION SOLUTION
5.0000 mL | Freq: Once | INTRAMUSCULAR | 0 refills | Status: AC
Start: 2018-11-29 — End: 2018-11-29

## 2018-11-29 NOTE — H&P (Signed)
PATIENT NAME: Courtney CockayneONTORNO, Poppy [facility]  [facility] NUMBER:  [id number]  DATE OF SERVICE: 11/29/2018  DATE OF BIRTH:  23-Jul-1965    HISTORY AND PHYSICAL    HISTORY OF PRESENT ILLNESS:  Ms. Courtney CockayneLisa [name] is a 53 year old female, new patient.  She is right-hand dominant.  The patient presents today indicating that she has had bilateral carpal tunnel symptoms for years.  She recently underwent an EMG nerve conduction study on August 23, 2018, at [facility] which was abnormal.  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  She is interested in undergoing surgery.  She indicates that she has had symptoms for several years but feels they have gotten worse in the past 6 months.  She describes her quality of discomfort as "aching, sharp, dull, instability with numbness and tingling".  She has decreased grip strength.  She states her symptoms wake her up at night.  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  Pain scale rating is a "7".  She has taken anti-inflammatories and worn braces. The patient also indicates that she sees Dr. Shea EvansEmery for cervical spine issues and has an appointment to see him today.       PAST MEDICAL HISTORY:  Hypertension, osteoporosis, thyroid disease, insulin resistance and fibromyalgia.    PAST SURGICAL HISTORY:  Left hip replacement with subsequent infection, 2 lower back surgeries, 3 C sections, hysterectomy and 3 left leg surgeries.  She states that sometimes she has nausea and vomiting with anesthesia.    ALLERGIES:  MORPHINE, reaction described as facial swelling.    CURRENT MEDICATIONS:  1. Protonix.  2. Singulair.  3. Milk thistle.  4. Metformin.  5. Magnesium oxide.  6. Losartan.  7. Synthroid.  8. Lopid.  9. Synalar cream.  10. Ferrotab.  11. Estrace.  12. Vitamin  B12.  13. Vitamin D.  14. Vitamin C.    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.  She indicates she quit smoking in 2008.  Denies any other form of nicotine use.  Denies recreational drug use.  Alcohol use is seldom.  Divorced.    FAMILY HISTORY:  Positive for diabetes, hypertension, and some problems with anesthesia.    REVIEW OF SYSTEMS:  Positive for fatigue, chest pain, cough, shortness of breath, nausea, urinary urgency, urinary frequency and painful urination, recent weight gain, eczema, numbness and tingling in her hands and feet, seasonal allergies, depression, anxiety, weakness of grip, weakness of pinch, swelling of joints and stiffness.    PHYSICAL EXAMINATION:  General appearance:  Appears healthy, well developed, well nourished, and in no acute distress .   Vital signs:  Blood pressure is 167/79, pulse 72, temperature 36.8 degrees Celsius, respirations 16.    Skin:  Warm, dry and intact.  No evidence of bruising.   Head:  Normocephalic without evidence of trauma.    Eyes ears, nose and throat:  Eyes and ears are symmetrical.   Chest:  Breathing is nonlabored.  Respirations are easy and regular.  Breath sounds are clear on auscultation.   Cardiac:  Heart sounds are regular without rub or murmur.   Abdomen:  Large, soft, nondistended.   Musculoskeletal:  Exam is limited to bilateral upper extremities.  Motor function of both hands are intact.  She is able to extend her  thumbs, make an O, cross 2 fingers and spread them out.  Sensation is normal to light touch over the radial, ulnar and median nerve distribution.  She has 2+ radial pulse.  Positive carpal compression exam bilaterally.  Evidence of thenar atrophy on the right thumb muscle.   Neurological:  Alert and oriented x4.   Psychological:  Affect is appropriate to today's visit.    IMAGING:  No x-rays were obtained today.    ASSESSMENT:  The patient is a 53 year old female with bilateral hand carpal  tunnel syndrome.    PLAN:  1. Discussed the patient's EMG results with her which indicate that she has more severe disease on the right side.  We discussed her surgical options which could include having both done at the same time endoscopically over in the main OR versus having her right hand done first under strict local with an open incision in the clinic procedure room.  The patient has decided to go with having both hands done at the same time.  A surgery card was filled out and scanned over to Dr. Inda Castle.  Informed consent was obtained and signed for bilateral endoscopic carpal tunnel release.  The patient indicated that she might change her mind and just have one done versus both which I said was fine.  2. Once Ms. Marcella decides on a surgery date, then we will just need to get her in for a preadmission testing appointment.  She states that a family member lives in [place] and so she could come up and stay with him and get her PAT done closer to the time of her surgery.  3. Ms. Garay was given the opportunity to ask questions today and appeared satisfied with the treatment plan.     A shared visit with the cosigning physician was performed.        Colleen Merlyn Albert, APRN, NP-C  [facility] Department of Orthopaedics       Mattie Marlin, MD  Associate Professor, [facility] Department of Orthopaedics    I personally interviewed and examined this patient. I made the diagnosis and recommended the plan of care. The note was documented by the APRN who saw them with me.  My findings are consistant with Carpal tunnel syndrome, bilateral    Carpal tunnel syndrome, unspecified laterality.  Mattie Marlin, MD 11/30/2018, 16:20                DD:  11/29/2018 16:11:54  DT:  11/29/2018 18:05:24 FP  D#:  [id number]

## 2018-11-29 NOTE — Progress Notes (Signed)
PATIENT NAME: Courtney CockayneONTORNO, Courtney [name] [name]:  [id number]  DATE OF SERVICE: 11/29/2018  DATE OF BIRTH:  04-15-1966    PROGRESS NOTE    PRIMARY CARE PROVIDER:  Laurell RoofJamie Louise Dilly, DO.    CHIEF COMPLAINT:  Follow up of neck and right shoulder pain, as well as a year and a half status post lumbar decompression and fusion.    SUBJECTIVE:  The patient is a 53 year old Caucasian female who presents to clinic today for the above-mentioned complaints.  The patient has recently been seen by Dr. Osvaldo HumanPrudhomme, who has determined she has carpal tunnel syndrome in the right upper extremity and will move forward with surgical intervention.  The patient states that her pain is predominantly in the right upper back, shoulder blade, radiating across the top of the shoulder and into the upper arm, as well as into the armpit.  She has a difficult time sleeping on her right side, as well as her pain increases when she is rolling in bed.  She has not had any physical therapy for her shoulder, however, has been taking over-the-counter medication without any significant relief. In terms of her low back, she has been developing some right hip pain.  She has seen a joint replacement specialist in the past and she was hoping to get a few more years out of her right hip.  She continues to have difficulty with her grip strength, as well as frequently dropping items, which is now believed to be from the carpal tunnel syndrome.    OBJECTIVE:  On exam, the patient is alert and oriented in no acute distress, seated comfortably in the exam room chair, moves about the exam with a nonantalgic gait pattern.  Her tandem gait is unremarkable.  She is 5/5 across the upper extremities for intrinsic grip, finger flexion, finger extension, wrist flexion, wrist extension, biceps, and triceps.  However, she does breakaway with right deltoid testing and also has pain diffusely with tenderness across the right shoulder.  This is not appreciated on the  left.  Reflexes are also 2+ bilaterally for biceps and triceps.  Faint left-sided Hoffmann's.  Negative reverse brachioradialis. No scapulohumeral reflex appreciated.    IMAGING:  Once again, reviewed the patient's previous cervical x-rays and MRI showing that she has significant degenerative changes diffusely throughout her cervical spine. She has some stenosis at C6-C7, as well as some osteophyte formation.    ASSESSMENT AND PLAN:  The patient was offered a subacromial right shoulder injection today by Dr. Shea EvansEmery and wished to proceed.  Please see separate injection note.  The patient will return to clinic in 4-6 months for her neck.  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  If she gets good relief from the injection, however, is short-lived, we will then   refer her to our shoulder [facility].  The patient voiced understanding and was agreeable. She can return to clinic in 6 months for her 2-year postop visit on her neck.        Courtney JosephJosee L Zydonik, PA-C    I personally saw and examined the patient. See physician's assistant note for additional details. 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  Will see how she responds to the subacromial injection.    Courtney [name] Courtney Sawyer, MD         Courtney [name] Courtney Shappell, MD  Professor and Chair  [place] Department of Orthopaedics               CC:   Laurell Roof, DO   [address]   [place], [place] [address]       DD:  11/29/2018 13:19:17  DT:  11/29/2018 21:19:50 KF  D#:  [id number]

## 2018-12-09 ENCOUNTER — Encounter (INDEPENDENT_AMBULATORY_CARE_PROVIDER_SITE_OTHER): Payer: 59 | Admitting: Neurology

## 2018-12-23 ENCOUNTER — Other Ambulatory Visit: Payer: Self-pay

## 2018-12-23 ENCOUNTER — Encounter (INDEPENDENT_AMBULATORY_CARE_PROVIDER_SITE_OTHER): Payer: 59 | Admitting: Neurology

## 2018-12-27 ENCOUNTER — Ambulatory Visit (HOSPITAL_BASED_OUTPATIENT_CLINIC_OR_DEPARTMENT_OTHER): Payer: Self-pay | Admitting: Physician Assistant

## 2018-12-27 NOTE — Telephone Encounter (Addendum)
Regarding: return call  ----- Message from [place] sent at 12/27/2018  8:24 AM EDT -----  Prudhomme pt    Patient requesting return call to discuss how soon after surgery she can drive  Please return her call  Thank you!        Returned patient's call about driving after surgery. She is schedule to have b/l CTR. She is safe to drive her personal vehicle as soon as she is off the pain medication.     --  Lawrence Marseilles, PA-C  12/27/2018, 16:25

## 2018-12-29 ENCOUNTER — Other Ambulatory Visit: Payer: Self-pay

## 2018-12-29 ENCOUNTER — Ambulatory Visit
Admission: RE | Admit: 2018-12-29 | Discharge: 2018-12-29 | Disposition: A | Discharge status: Home / Self Care | Payer: 59 | Source: Ambulatory Visit | Attending: SURGERY OF THE HAND | Admitting: SURGERY OF THE HAND

## 2018-12-29 ENCOUNTER — Ambulatory Visit (HOSPITAL_COMMUNITY)
Admission: RE | Admit: 2018-12-29 | Discharge: 2018-12-29 | Disposition: A | Discharge status: Home / Self Care | Payer: 59 | Source: Ambulatory Visit

## 2018-12-29 ENCOUNTER — Encounter (HOSPITAL_COMMUNITY): Payer: Self-pay

## 2018-12-29 ENCOUNTER — Ambulatory Visit (INDEPENDENT_AMBULATORY_CARE_PROVIDER_SITE_OTHER): Payer: 59 | Admitting: Rheumatology

## 2018-12-29 VITALS — BP 142/84 | HR 76 | Temp 97.2°F | Resp 18 | Ht 64.37 in | Wt 195.3 lb

## 2018-12-29 DIAGNOSIS — E119 Type 2 diabetes mellitus without complications: Secondary | ICD-10-CM | POA: Insufficient documentation

## 2018-12-29 DIAGNOSIS — F32A Depression, unspecified: Secondary | ICD-10-CM

## 2018-12-29 DIAGNOSIS — R011 Cardiac murmur, unspecified: Secondary | ICD-10-CM

## 2018-12-29 DIAGNOSIS — R609 Edema, unspecified: Secondary | ICD-10-CM

## 2018-12-29 DIAGNOSIS — F329 Major depressive disorder, single episode, unspecified: Secondary | ICD-10-CM

## 2018-12-29 DIAGNOSIS — Z9289 Personal history of other medical treatment: Secondary | ICD-10-CM

## 2018-12-29 DIAGNOSIS — R600 Localized edema: Secondary | ICD-10-CM

## 2018-12-29 DIAGNOSIS — K219 Gastro-esophageal reflux disease without esophagitis: Secondary | ICD-10-CM

## 2018-12-29 DIAGNOSIS — G4730 Sleep apnea, unspecified: Secondary | ICD-10-CM

## 2018-12-29 DIAGNOSIS — L309 Dermatitis, unspecified: Secondary | ICD-10-CM

## 2018-12-29 DIAGNOSIS — I10 Essential (primary) hypertension: Secondary | ICD-10-CM | POA: Insufficient documentation

## 2018-12-29 DIAGNOSIS — Z01818 Encounter for other preprocedural examination: Secondary | ICD-10-CM | POA: Insufficient documentation

## 2018-12-29 DIAGNOSIS — J45909 Unspecified asthma, uncomplicated: Secondary | ICD-10-CM

## 2018-12-29 DIAGNOSIS — D649 Anemia, unspecified: Secondary | ICD-10-CM

## 2018-12-29 DIAGNOSIS — R0602 Shortness of breath: Secondary | ICD-10-CM

## 2018-12-29 HISTORY — DX: Peripheral vascular disease, unspecified: I73.9

## 2018-12-29 HISTORY — DX: Edema, unspecified: R60.9

## 2018-12-29 HISTORY — DX: Unspecified osteoarthritis, unspecified site: M19.90

## 2018-12-29 HISTORY — DX: Anemia, unspecified: D64.9

## 2018-12-29 HISTORY — DX: Dermatitis, unspecified: L30.9

## 2018-12-29 HISTORY — DX: Unspecified asthma, uncomplicated: J45.909

## 2018-12-29 HISTORY — DX: Dysphagia, unspecified: R13.10

## 2018-12-29 HISTORY — DX: Gout, unspecified: M10.9

## 2018-12-29 HISTORY — DX: Sleep apnea, unspecified: G47.30

## 2018-12-29 HISTORY — DX: Headache, unspecified: R51.9

## 2018-12-29 HISTORY — DX: Personal history of other medical treatment: Z92.89

## 2018-12-29 HISTORY — DX: Localized edema: R60.0

## 2018-12-29 HISTORY — DX: Gastro-esophageal reflux disease without esophagitis: K21.9

## 2018-12-29 HISTORY — DX: Shortness of breath: R06.02

## 2018-12-29 HISTORY — DX: Major depressive disorder, single episode, unspecified: F32.9

## 2018-12-29 HISTORY — DX: Cardiac murmur, unspecified: R01.1

## 2018-12-29 HISTORY — DX: Depression, unspecified: F32.A

## 2018-12-29 LAB — HGA1C (HEMOGLOBIN A1C WITH EST AVG GLUCOSE)
ESTIMATED AVERAGE GLUCOSE: 117 mg/dL
HEMOGLOBIN A1C: 5.7 % — ABNORMAL HIGH (ref 4.0–5.6)

## 2018-12-29 LAB — CBC WITH DIFF
BASOPHIL #: <0.1 10*3/uL (ref <=0.20)
BASOPHIL %: 1 %
EOSINOPHIL #: 0.38 10*3/uL (ref <=0.50)
EOSINOPHIL %: 4 %
HCT: 41.1 % (ref 34.8–46.0)
HGB: 13.5 g/dL (ref 11.5–16.0)
IMMATURE GRANULOCYTE #: <0.1 10*3/uL (ref <0.10)
IMMATURE GRANULOCYTE %: 0 % (ref 0–1)
LYMPHOCYTE #: 2.56 10*3/uL (ref 1.00–4.80)
LYMPHOCYTE %: 29 %
MCH: 28.5 pg (ref 26.0–32.0)
MCHC: 32.8 g/dL (ref 31.0–35.5)
MCV: 86.9 fL (ref 78.0–100.0)
MONOCYTE #: 0.52 10*3/uL (ref 0.20–1.10)
MONOCYTE %: 6 %
MPV: 10.1 fL (ref 8.7–12.5)
NEUTROPHIL #: 5.22 10*3/uL (ref 1.50–7.70)
NEUTROPHIL %: 60 %
PLATELETS: 286 10*3/uL (ref 150–400)
RBC: 4.73 10*6/uL (ref 3.85–5.22)
RDW-CV: 13.6 % (ref 11.5–15.5)
WBC: 8.8 10*3/uL (ref 3.7–11.0)

## 2018-12-29 LAB — ECG 12-LEAD (PERFORMED IN PREADMISSION UNIT ONLY)
Atrial Rate: 65 {beats}/min
Calculated P Axis: 53 degrees
Calculated R Axis: 39 degrees
Calculated T Axis: 53 degrees
PR Interval: 162 ms
QT Interval: 398 ms
QTC Calculation: 413 ms
Ventricular rate: 65 {beats}/min

## 2018-12-29 LAB — BASIC METABOLIC PANEL
ANION GAP: 9 mmol/L (ref 4–13)
BUN/CREA RATIO: 19 (ref 6–22)
BUN: 13 mg/dL (ref 8–25)
CALCIUM: 9.2 mg/dL (ref 8.5–10.2)
CHLORIDE: 106 mmol/L (ref 96–111)
CO2 TOTAL: 25 mmol/L (ref 22–32)
CREATININE: 0.69 mg/dL (ref 0.49–1.10)
ESTIMATED GFR: >60 mL/min/{1.73_m2} (ref >60)
GLUCOSE: 93 mg/dL (ref 65–139)
POTASSIUM: 3.6 mmol/L (ref 3.5–5.1)
SODIUM: 140 mmol/L (ref 136–145)

## 2018-12-29 LAB — POC BLOOD GLUCOSE (RESULTS): GLUCOSE, POC: 105 mg/dL (ref 70–105)

## 2018-12-29 MED ORDER — DEXTROSE 5 % IN WATER (D5W) INTRAVENOUS SOLUTION
2.0000 g | Freq: Once | INTRAVENOUS | Status: AC
Start: 2018-12-30 — End: 2018-12-30
  Administered 2018-12-30: 12:00:00 2 g via INTRAVENOUS
  Filled 2018-12-29: qty 20

## 2018-12-29 NOTE — Anesthesia Preprocedure Evaluation (Addendum)
ANESTHESIA PRE-OP EVALUATION  Planned Procedure: RELEASE CARPAL TUNNEL ENDOSCOPIC BILATERAL (Bilateral Wrist)  Review of Systems         patient summary reviewed          Pulmonary     Cardiovascular    No peripheral edema,        GI/Hepatic/Renal        Endo/Other          Neuro/Psych/MS        Cancer                   Physical Assessment      Patient summary reviewed   Airway       Mallampati: III    TM distance: >3 FB    Neck ROM: full  Mouth Opening: good.      No endotracheal tube present  No Tracheostomy present    Dental           (+) missing           Pulmonary    Breath sounds clear to auscultation       Cardiovascular    Rhythm: regular  Rate: Normal  (-) no friction rub, carotid bruit is not present, no peripheral edema and no murmur     Other findings            Plan  ASA 3     Planned anesthesia type: general              Intravenous induction     Anesthesia issues/risks discussed are: Dental Injuries, Stroke, Intraoperative Awareness/ Recall, Aspiration, Post-op Pain Management, PONV, Post-op Cognitive Dysfunction, Difficult Airway, Sore Throat and Cardiac Events/MI.  Anesthetic plan and risks discussed with patient.          Patient's NPO status is appropriate for Anesthesia.           Plan discussed with CRNA, attending and physician.                 ECG Ordered: 12/29/2018  CXR Not Ordered   Labs Ordered:12/29/2018    Echo: 12/02/2018 (media)  EF 55-60% . No wall motion abnormalities. Abnormal left ventricular diastolic dysfunction.   No significant valvular dysfunction       Reports hx of chest pain for "years" without changes. States she can not recall any recent chest pain events states she usually does not pay attention.     She reports having a recent echo at [facility] for left lower extremity swelling. Report last stress test was 3-4 years ago. Records requested stat.     Addendum 12/30/2018: Unable to locate previous stress test    Patient instructed to take the following medications  the morning of surgery; Protonix and levothyroxine  Patient instructed to to hold the following medications the morning of surgery; losartan and metformin   Patient instructed to hold nsaids, vitamins, fish oil, and herbal supplements 7 days prior to the procedure.    Patient provided anesthesia consent in PAT. Instructed to review consent prior to OR date. Educated that consent will be signed morning of surgery with anesthesiologist

## 2018-12-30 ENCOUNTER — Other Ambulatory Visit: Payer: Self-pay

## 2018-12-30 ENCOUNTER — Inpatient Hospital Stay
Admission: RE | Admit: 2018-12-30 | Discharge: 2018-12-30 | Disposition: A | Discharge status: Home / Self Care | Payer: 59 | Source: Ambulatory Visit | Attending: SURGERY OF THE HAND | Admitting: SURGERY OF THE HAND

## 2018-12-30 ENCOUNTER — Ambulatory Visit (HOSPITAL_BASED_OUTPATIENT_CLINIC_OR_DEPARTMENT_OTHER): Payer: 59 | Admitting: Certified Registered"

## 2018-12-30 ENCOUNTER — Encounter (HOSPITAL_COMMUNITY): Payer: Self-pay

## 2018-12-30 ENCOUNTER — Ambulatory Visit (HOSPITAL_BASED_OUTPATIENT_CLINIC_OR_DEPARTMENT_OTHER): Payer: 59 | Admitting: SURGERY OF THE HAND

## 2018-12-30 ENCOUNTER — Ambulatory Visit (HOSPITAL_COMMUNITY): Payer: 59 | Admitting: NURSE PRACTITIONER

## 2018-12-30 ENCOUNTER — Encounter (HOSPITAL_COMMUNITY)
Admission: RE | Disposition: A | Discharge status: Home / Self Care | Payer: Self-pay | Source: Ambulatory Visit | Attending: SURGERY OF THE HAND

## 2018-12-30 DIAGNOSIS — I10 Essential (primary) hypertension: Secondary | ICD-10-CM | POA: Insufficient documentation

## 2018-12-30 DIAGNOSIS — E039 Hypothyroidism, unspecified: Secondary | ICD-10-CM | POA: Insufficient documentation

## 2018-12-30 DIAGNOSIS — K219 Gastro-esophageal reflux disease without esophagitis: Secondary | ICD-10-CM | POA: Insufficient documentation

## 2018-12-30 DIAGNOSIS — R7303 Prediabetes: Secondary | ICD-10-CM | POA: Insufficient documentation

## 2018-12-30 DIAGNOSIS — Z7984 Long term (current) use of oral hypoglycemic drugs: Secondary | ICD-10-CM | POA: Insufficient documentation

## 2018-12-30 DIAGNOSIS — D509 Iron deficiency anemia, unspecified: Secondary | ICD-10-CM | POA: Insufficient documentation

## 2018-12-30 DIAGNOSIS — G5603 Carpal tunnel syndrome, bilateral upper limbs: Secondary | ICD-10-CM

## 2018-12-30 DIAGNOSIS — Z79890 Hormone replacement therapy (postmenopausal): Secondary | ICD-10-CM | POA: Insufficient documentation

## 2018-12-30 DIAGNOSIS — G4730 Sleep apnea, unspecified: Secondary | ICD-10-CM | POA: Insufficient documentation

## 2018-12-30 DIAGNOSIS — F17210 Nicotine dependence, cigarettes, uncomplicated: Secondary | ICD-10-CM | POA: Insufficient documentation

## 2018-12-30 DIAGNOSIS — Z79899 Other long term (current) drug therapy: Secondary | ICD-10-CM | POA: Insufficient documentation

## 2018-12-30 LAB — POC BLOOD GLUCOSE (RESULTS): GLUCOSE, POC: 92 mg/dL (ref 70–105)

## 2018-12-30 SURGERY — RELEASE CARPAL TUNNEL ENDOSCOPIC BILATERAL
Anesthesia: General | Site: Wrist | Laterality: Bilateral | Wound class: Clean Wound: Uninfected operative wounds in which no inflammation occurred

## 2018-12-30 MED ORDER — LACTATED RINGERS INTRAVENOUS SOLUTION
INTRAVENOUS | Status: DC
Start: 2018-12-30 — End: 2018-12-30

## 2018-12-30 MED ORDER — PROPOFOL 10 MG/ML INTRAVENOUS EMULSION
INTRAVENOUS | Status: DC | PRN
Start: 2018-12-30 — End: 2018-12-30
  Administered 2018-12-30: 75 ug/kg/min via INTRAVENOUS
  Administered 2018-12-30: 13:00:00 0 ug/kg/min via INTRAVENOUS

## 2018-12-30 MED ORDER — LACTATED RINGERS INTRAVENOUS SOLUTION
INTRAVENOUS | Status: DC
Start: 2018-12-30 — End: 2018-12-30
  Administered 2018-12-30 (×2): via INTRAVENOUS

## 2018-12-30 MED ORDER — PROMETHAZINE 25 MG/ML INJECTION SOLUTION
Freq: Once | INTRAMUSCULAR | Status: DC | PRN
Start: 2018-12-30 — End: 2018-12-30
  Administered 2018-12-30: 12.5 mg via INTRAVENOUS

## 2018-12-30 MED ORDER — LACTATED RINGERS INTRAVENOUS SOLUTION
INTRAVENOUS | Status: DC
Start: 2018-12-30 — End: 2018-12-30
  Administered 2018-12-30 (×3): via INTRAVENOUS

## 2018-12-30 MED ORDER — ACETAMINOPHEN 300 MG-CODEINE 30 MG TABLET
1.0000 | ORAL_TABLET | Freq: Once | ORAL | Status: AC
Start: 2018-12-30 — End: 2018-12-30
  Administered 2018-12-30: 15:00:00 1 via ORAL
  Filled 2018-12-30 (×2): qty 1

## 2018-12-30 MED ORDER — SENNOSIDES 8.6 MG-DOCUSATE SODIUM 50 MG TABLET
1.00 | ORAL_TABLET | Freq: Every evening | ORAL | 0 refills | Status: AC
Start: 2018-12-30 — End: 2019-01-06

## 2018-12-30 MED ORDER — APREPITANT 40 MG CAPSULE
ORAL_CAPSULE | Freq: Once | ORAL | Status: DC | PRN
Start: 2018-12-30 — End: 2018-12-30
  Administered 2018-12-30: 12:00:00 40 mg via ORAL

## 2018-12-30 MED ORDER — DEXAMETHASONE SODIUM PHOSPHATE 4 MG/ML INJECTION SOLUTION
Freq: Once | INTRAMUSCULAR | Status: DC | PRN
Start: 2018-12-30 — End: 2018-12-30
  Administered 2018-12-30: 4 mg via INTRAVENOUS

## 2018-12-30 MED ORDER — MIDAZOLAM 1 MG/ML INJECTION SOLUTION
Freq: Once | INTRAMUSCULAR | Status: DC | PRN
Start: 2018-12-30 — End: 2018-12-30
  Administered 2018-12-30: 2 mg via INTRAVENOUS

## 2018-12-30 MED ORDER — FENTANYL (PF) 50 MCG/ML INJECTION SOLUTION
25.0000 ug | INTRAMUSCULAR | Status: DC | PRN
Start: 2018-12-30 — End: 2018-12-30
  Administered 2018-12-30 (×2): 25 ug via INTRAVENOUS
  Filled 2018-12-30: qty 2

## 2018-12-30 MED ORDER — SODIUM CHLORIDE 0.9 % (FLUSH) INJECTION SYRINGE
2.0000 mL | INJECTION | INTRAMUSCULAR | Status: DC | PRN
Start: 2018-12-30 — End: 2018-12-30

## 2018-12-30 MED ORDER — FENTANYL (PF) 50 MCG/ML INJECTION SOLUTION
Freq: Once | INTRAMUSCULAR | Status: DC | PRN
Start: 2018-12-30 — End: 2018-12-30
  Administered 2018-12-30: 100 ug via INTRAVENOUS

## 2018-12-30 MED ORDER — SODIUM CHLORIDE 0.9 % (FLUSH) INJECTION SYRINGE
2.0000 mL | INJECTION | Freq: Three times a day (TID) | INTRAMUSCULAR | Status: DC
Start: 2018-12-30 — End: 2018-12-30

## 2018-12-30 MED ORDER — LIDOCAINE (PF) 100 MG/5 ML (2 %) INTRAVENOUS SYRINGE
INJECTION | Freq: Once | INTRAVENOUS | Status: DC | PRN
Start: 2018-12-30 — End: 2018-12-30
  Administered 2018-12-30: 100 mg via INTRAVENOUS

## 2018-12-30 MED ORDER — DEXMEDETOMIDINE 4 MCG/ML IV DILUTION
Freq: Once | INTRAMUSCULAR | Status: DC | PRN
Start: 2018-12-30 — End: 2018-12-30
  Administered 2018-12-30: 13:00:00 12 ug via INTRAVENOUS

## 2018-12-30 MED ORDER — ACETAMINOPHEN 300 MG-CODEINE 30 MG TABLET
1.00 | ORAL_TABLET | ORAL | 0 refills | Status: DC | PRN
Start: 2018-12-30 — End: 2019-09-19

## 2018-12-30 MED ORDER — ONDANSETRON HCL (PF) 4 MG/2 ML INJECTION SOLUTION
4.0000 mg | Freq: Once | INTRAMUSCULAR | Status: DC | PRN
Start: 2018-12-30 — End: 2018-12-30

## 2018-12-30 MED ORDER — HALOPERIDOL LACTATE 5 MG/ML INJECTION SOLUTION
1.0000 mg | Freq: Once | INTRAMUSCULAR | Status: DC | PRN
Start: 2018-12-30 — End: 2018-12-30

## 2018-12-30 MED ORDER — HYDROCODONE 5 MG-ACETAMINOPHEN 325 MG TABLET
1.00 | ORAL_TABLET | ORAL | 0 refills | Status: DC | PRN
Start: 2018-12-30 — End: 2018-12-30

## 2018-12-30 MED ORDER — SODIUM CHLORIDE 0.9 % (FLUSH) INJECTION SYRINGE
2.0000 mL | INJECTION | Freq: Three times a day (TID) | INTRAMUSCULAR | Status: DC
Start: 2018-12-30 — End: 2018-12-30
  Administered 2018-12-30: 14:00:00

## 2018-12-30 MED ORDER — SCOPOLAMINE 1 MG OVER 3 DAYS TRANSDERMAL PATCH
MEDICATED_PATCH | Freq: Once | TRANSDERMAL | Status: DC | PRN
Start: 2018-12-30 — End: 2018-12-30
  Administered 2018-12-30: 12:00:00 1 via TRANSDERMAL

## 2018-12-30 MED ORDER — BUPIVACAINE (PF) 0.25 % (2.5 MG/ML) INJECTION SOLUTION
20.0000 mL | Freq: Once | INTRAMUSCULAR | Status: AC
Start: 2018-12-30 — End: 2018-12-30
  Administered 2018-12-30: 13:00:00 18 mL

## 2018-12-30 MED ORDER — PROPOFOL 10 MG/ML IV BOLUS
INJECTION | Freq: Once | INTRAVENOUS | Status: DC | PRN
Start: 2018-12-30 — End: 2018-12-30
  Administered 2018-12-30: 12:00:00 200 mg via INTRAVENOUS

## 2018-12-30 SURGICAL SUPPLY — 27 items
BANDAGE HK-LOCK 5YDX2IN KNTD E LAS UNQ LPLK DSGN VLCR FRN (WOUND CARE/ENTEROSTOMAL SUPPLY) ×2
BANDAGE HOOK-LOCK 5YDX2IN STRL KNTD ELAS UNQ LPLK DSGN VELCRO FRN CLSR COTTON PLSTR YARN COMPRESS LF (WOUND CARE SUPPLY) ×2 IMPLANT
BLANKET MISTRAL-AIR ADULT LWR BODY 55.9X40.2IN FRC AIR HI VOL BLWR INTUITIVE CONTROL PNL LRG LED (MISCELLANEOUS PT CARE ITEMS) ×1 IMPLANT
BLANKET WARMER 55.9INW X 40.2I LOWER BODY MISTRAL-AIR (MISCELLANEOUS PT CARE ITEMS) ×1
CONV USE 328206 - HOSE EXT 36IN 2 POS LOCK CONN STRL LF  DISP (SURGICAL INSTRUMENTS) IMPLANT
CONV USE 405185 - CUFF TOURNIQUET 18X4IN ATS CYL 2 PORT 1 BLADDER SLEEVE STRL LF  DISP (ORTHOPEDICS (NOT IMPLANTS)) ×1 IMPLANT
CONV USE ITEM 337890 - PACK SURG BSIN 2 STRL LF  DISP (CUSTOM TRAYS & PACK) ×1 IMPLANT
COVER 53X24IN MAYOSTAND PRXM STRL DISP EQP SMS LF (DRAPE/PACKS/SHEETS/OR TOWEL) ×1 IMPLANT
CUFF TOURNIQUET 18X4IN ATS CYL 2 PORT 1 BLADDER SLEEVE STRL LF  DISP (ORTHOPEDICS (NOT IMPLANTS)) ×1
DRAPE ADH 51X47IN STRDRP LF  STRL DISP SURG CLR (PROTECTIVE PRODUCTS/GARMENTS) ×2 IMPLANT
DRAPE ADH 51X47IN U STRDRP LF STRL DISP SURG PLASTIC CLR (PROTECTIVE PRODUCTS/GARMENTS) ×2
DRAPE FNFLD SHEET 70X40IN MED PRXM LF  STRL DISP SURG SMS (PROTECTIVE PRODUCTS/GARMENTS) ×1 IMPLANT
DRAPE REINF FENESTRATE CORD HOLD TAB ELAS 146X77IN HAND CRC CNVRT LF  STRL DISP SURG TBRN VELCRO 34 (PROTECTIVE PRODUCTS/GARMENTS) ×1 IMPLANT
GARMENT COMPRESS MED CALF CENT AURA NYL VASOGRAD LTWT BRTHBL (ORTHOPEDICS (NOT IMPLANTS)) ×1
GARMENT COMPRESS MED CALF CENTAURA NYL VASOGRAD LTWT BRTHBL SEQ FIL BLU 18- IN (ORTHOPEDICS (NOT IMPLANTS)) ×1 IMPLANT
GOWN SURG 2XL L3 REINF HKLP CLSR SET IN SLEEVE STRL LF  DISP BLU SIRUS SMS 49IN (PROTECTIVE PRODUCTS/GARMENTS) ×1 IMPLANT
HOSE EXT 36IN 2 POS LOCK CONN STRL LF  DISP (INSTRUMENTS)
HOSE EXT 36IN 2 POS LOCK CONN STRL LF DISP (INSTRUMENTS)
KIT PROC ECTR2 PRB RETRO 3ANG_KNF HAND PAD STRL DISP CRPL (CUTTING ELEMENTS) ×1
KIT PROC ECTR2 PROBE RETRO 3ANG KNIFE HAND PAD SWAB STRL DISP CRPL LGMNT (SURGICAL CUTTING SUPPLIES) ×1 IMPLANT
PACK BASIN DBL CUSTOM (CUSTOM TRAYS & PACK) ×1
PACK HAND TRAY ORTHO REVISED UTC (CUSTOM TRAYS & PACK) ×1
PACK SURG UTC HAND STRL DISP LF (CUSTOM TRAYS & PACK) ×1 IMPLANT
SPONGE GAUZE STRL 4 X 4IN TUB_6939 1280/CS (WOUND CARE SUPPLY) ×1 IMPLANT
STKNT ORTHO 60X4IN COTTON 1 PLY PCUT LF  TUBE STRL NATURAL (ORTHOPEDICS (NOT IMPLANTS)) ×1 IMPLANT
STOCKINETTE ORTHO 4X5IN PRECUT COTTON STRL 7645 (ORTHOPEDICS (NOT IMPLANTS)) ×1
TRAY SKIN SCRUB 8IN VNYL COTTON 6 WNG 6 SPONGE STICK 2 TIP APPL DRY STRL LF (KITS & TRAYS (DISPOSABLE)) ×2 IMPLANT

## 2018-12-30 NOTE — Nurses Notes (Signed)
Spoke with Dr. Rosario Jacks re: pt refusing rx for Norco 5 mg for home use. Stated "it makes me feel to funny." MD stated he would send home with a script for Tylenol with Codeine.

## 2018-12-30 NOTE — Progress Notes (Signed)
[place] [facility]  Service: Hand  Attending: Madelyne Millikan  Progress Note/Discharge Summary  12/30/2018    Name: Courtney [name]  DOB: 01-29-1966  MRN: [id number]    RECENT ORTHO SURGERY:  - 12/30/18 = Bilateral endoscopic carpal tunnel release (Surgeon - Joshalyn Ancheta)    SUBJECTIVE:  Patient to PACU in hemodynamically stable condition. Resting comfortably, maintaining airway. Pain tolerable.     OBJECTIVE:  Blood pressure 126/73, pulse 59, temperature 36.9 C (98.4 F), resp. rate 16, height 1.626 m (_0 ), weight 88.2 kg (194 lb 7.1 oz), SpO2 97 %.    BUE:   - SILT over m/u/r distributions  - motor function intact (+ thumbs up, okay sign, fingers crossed)  - 2+ radial pulse  - dressing c/d/i      ASSESSMENT:  53 y.o. female Day of Surgery s/p bilateral endoscopic carpal tunnel release       PLAN:  - Weightbearing: WBAT BUE  - Pain: po  - Dressing: located BUE, plan to change POD 3  - Diet: resume home diet   - Dispo: d/c to home when recovery area d/c criteria met  - Follow-up: will see back in Dr. Ok Anis clinic on 7/15. Order placed in [place].      Lucita Lora, MD  Resident, PGY-4  Department of Orthopaedics  Pager [phone]  12/30/2018 13:29

## 2018-12-30 NOTE — Anesthesia Transfer of Care (Signed)
ANESTHESIA TRANSFER OF CARE   Courtney BloodgoodLisa Michelle Carbajal is a 53 y.o. ,female, Weight: 88.2 kg (194 lb 7.1 oz)   had Procedure(s):  RELEASE CARPAL TUNNEL ENDOSCOPIC BILATERAL  performed  12/30/18   Primary Service: Mattie MarlinJoseph Prudhomme, MD    Past Medical History:   Diagnosis Date   . Anemia 12/29/2018    iron Deficiency    . Arthritis    . Asthma 12/29/2018    childhood, no inhalers since   . Depression 12/29/2018    Denies SI/HI    . Eczema 12/29/2018    hands x 1 year- eczema- seen derm   . Esophageal reflux 12/29/2018    controlled   . Fibromyalgia    . Gout    . Headache    . Heart murmur 12/29/2018    Reports she was told this in her 6920s   . Hx of transfusion 12/29/2018    denies rx   . Hyperlipidemia    . Hypertension    . Hypothyroid    . Nausea with vomiting    . Peripheral edema 12/29/2018    chronic left lower extremity swelling    . Peripheral vascular disease (CMS HCC)     left lower extremity    . Pre-diabetes    . Problems with swallowing    . Shortness of breath 12/29/2018    chronic unchanged shortness of breath   . Sleep apnea 12/29/2018    doesn't wear CPAP    . Thyroid disease    . Wears glasses       Allergy History as of 12/30/18     MORPHINE       Noted Status Severity Type Reaction    06/08/17 1506 Rhodia AlbrightVaglienti, Julia, RN 08/30/14 Active Low  Swelling    08/30/14 1056 Faith RogueMcClure, Brittany 08/30/14 Active             NITROFURANTOIN       Noted Status Severity Type Reaction    06/08/17 1506 Rhodia AlbrightVaglienti, Julia, RN 08/30/14 Active High  Shortness of Breath    08/30/14 1056 Faith RogueMcClure, Brittany 08/30/14 Active             OTHER       Noted Status Severity Type Reaction    12/29/18 1649 Alderson, TracyBrianna, RN 06/08/17 Deleted       Comments:  Patient states an anti nausea medication caused her to "keep throwing up" after receiving it. She doesn't remember the name of this medications but states "Zofran sounds familiar"      06/08/17 1515 Rhodia AlbrightVaglienti, Julia, RN 06/08/17 Active       Comments:  Patient states an anti  nausea medication caused her to "keep throwing up" after receiving it. She doesn't remember the name of this medications but states "Zofran sounds familiar"            ONDANSETRON       Noted Status Severity Type Reaction    12/29/18 1648 Alderson, WashingtonBrianna, RN 12/29/18 Active Low  Nausea/ Vomiting              I completed my transfer of care / handoff to the receiving personnel during which we discussed:  Access, Airway, All key/critical aspects of case discussed, Analgesia, Antibiotics, Expectation of post procedure, Fluids/Product, Gave opportunity for questions and acknowledgement of understanding and PMHx    Post Location: PACU  Additional Info:PT AWAKE, RESTING COMFORTABLY, SPONT VENT ON O2 FM. NASAL TRUMPET INTACT.VSS./ REPORT TO RN.                       Last OR Temp: Temperature: 36 C (96.8 F)  ABG:  POTASSIUM   Date Value Ref Range Status   12/29/2018 3.6 3.5 - 5.1 mmol/L Final     CALCIUM   Date Value Ref Range Status   12/29/2018 9.2 8.5 - 10.2 mg/dL Final     Calculated P Axis   Date Value Ref Range Status   12/29/2018 53 degrees Final     Calculated R Axis   Date Value Ref Range Status   12/29/2018 39 degrees Final     Calculated T Axis   Date Value Ref Range Status   12/29/2018 53 degrees Final     Airway:* No LDAs found *  Blood pressure 140/77, pulse 70, temperature 36 C (96.8 F), resp. rate 15, height 1.626 m (5\' 4" ), weight 88.2 kg (194 lb 7.1 oz), SpO2 100 %.

## 2018-12-30 NOTE — Nurses Notes (Signed)
Dr Frederik Schmidt aware of the delay and stat's response

## 2018-12-30 NOTE — H&P (Signed)
[facility]  H&P Update Form    Courtney [name], Courtney [name], 53 y.o. female  Encounter Start Date:  12/30/2018  Inpatient Admission Date:   Date of Birth:  1966-06-26    12/30/2018    STOP: IF H&P IS GREATER THAN 30 DAYS FROM SURGICAL DAY COMPLETE NEW H&P IS REQUIRED.     H & P updated the day of the procedure.  1.  H&P completed within 31 days of surgical procedure by [name], APRN on 11-29-2927  and has been reviewed within 24 hours of the surgery, the patient has been examined, and no change has occured in the patients condition since the H&P was completed. I re-asked the patient all relevant questions and examined her extremities today. She has a mild skin rash which looks like a contact dermatitis. It is not in the surgical field.It should be ok to proceed with her surgery.      Change in medications: No          Last Menstrual Period: Not applicable      Comments:     2.  Patient continues to be appropiate candidate for planned surgical procedure. YES      Ali Lowe, MD

## 2018-12-30 NOTE — Nurses Notes (Signed)
Dr prudhomme requests iv in foot.  Stat paged

## 2018-12-30 NOTE — Nurses Notes (Signed)
Stat paged again.  No response

## 2018-12-30 NOTE — H&P (Signed)
HISTORY OF PRESENT ILLNESS:  Ms. Courtney CockayneLisa [name] is a 53 year old female, new patient.  She is right-hand dominant.  The patient presents today indicating that she has had bilateral carpal tunnel symptoms for years.  She recently underwent an EMG nerve conduction study on August 23, 2018, [facility] which was abnormal.  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  She is interested in undergoing surgery.  She indicates that she has had symptoms for several years but feels they have gotten worse in the past 6 months.  She describes her quality of discomfort as "aching, sharp, dull, instability with numbness and tingling".  She has decreased grip strength.  She states her symptoms wake her up at night.  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 She has taken anti-inflammatories and worn braces. The patient also indicates that she sees Dr. Shea EvansEmery for cervical spine issues.    PAST MEDICAL HISTORY:  Hypertension, osteoporosis, thyroid disease, insulin resistance and fibromyalgia.    PAST SURGICAL HISTORY:  Left hip replacement with subsequent infection, 2 lower back surgeries, 3 C sections, hysterectomy and 3 left leg surgeries.  She states that sometimes she has nausea and vomiting with anesthesia.    ALLERGIES:  MORPHINE, reaction described as facial swelling.    CURRENT MEDICATIONS:  1.           Protonix.  2.           Singulair.  3.           Milk thistle.  4.           Metformin.  5.           Magnesium oxide.  6.           Losartan.  7.           Synthroid.  8.           Lopid.  9.           Synalar cream.  10.         Ferrotab.  11.         Estrace.  12.         Vitamin B12.  13.         Vitamin D.  14.         Vitamin C.    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.  She indicates she quit smoking in 2008.  Denies any other form of nicotine use.  Denies recreational drug use.  Alcohol use is seldom.  Divorced.    FAMILY HISTORY:  Positive for diabetes, hypertension, and some problems with anesthesia.    PHYSICAL EXAMINATION:  General appearance:  Appears healthy, well developed, well nourished, and in no acute distress .   Blood pressure 126/73, pulse 59, temperature 36.9 C (98.4 F), resp. rate 16, height 1.626 m (5\' 4" ), weight 88.2 kg (194 lb 7.1 oz), SpO2 97 %.    Skin:  Contact dermatitis over forearms; out of the region of the incisions   Musculoskeletal:  Exam is limited to bilateral upper extremities.  Motor function of both hands are intact.  She is able to extend her thumbs, make an O, cross 2 fingers and spread them out.  Sensation is normal to light  touch over the radial, ulnar and median nerve distribution.  She has 2+ radial pulse.  Positive carpal compression exam bilaterally.  Evidence of thenar atrophy on the right thumb muscle.     IMAGING:  No x-rays were obtained today.    ASSESSMENT:  The patient is a 53 year old female with bilateral hand carpal tunnel syndrome.    PLAN:  - NPO for OR today  - OR today for bilateral endoscopic carpal tunnel releases   - Plan for discharge home after surgery     Lucita Lora, MD  Resident, PGY-4  Department of Orthopaedics  Pager [phone]  12/30/2018 11:57

## 2018-12-30 NOTE — Anesthesia Postprocedure Evaluation (Signed)
Anesthesia Post Op Evaluation    Patient: Courtney [name]  Procedure(s):  RELEASE CARPAL TUNNEL ENDOSCOPIC BILATERAL    Last Vitals:Temperature: 36.2 C (97.2 F) (12/30/18 1515)  Heart Rate: 62 (12/30/18 1545)  BP (Non-Invasive): (!) 160/80 (12/30/18 1545)  Respiratory Rate: 16 (12/30/18 1545)  SpO2: 98 % (12/30/18 1545)    Patient is sufficiently recovered from the effects of anesthesia to participate in the evaluation and has returned to their pre-procedure level.  Patient location during evaluation: PACU       Patient participation: complete - patient participated  Level of consciousness: awake and alert and responsive to verbal stimuli    Pain management: adequate  Airway patency: patent    Anesthetic complications: no  Cardiovascular status: acceptable  Respiratory status: acceptable  Hydration status: acceptable  Patient post-procedure temperature: Pt Normothermic   PONV Status: Absent

## 2018-12-30 NOTE — Nurses Notes (Signed)
Stat returned page, notified of iv access need and surgery time

## 2018-12-30 NOTE — Discharge Instructions (Signed)
SURGICAL DISCHARGE INSTRUCTIONS     Dr. Prudhomme, Joseph, MD  performed your RELEASE CARPAL TUNNEL ENDOSCOPIC BILATERAL today at the [facility]    [facility]:  Monday through Friday from 6 a.m. - 7 p.m.: [phone]  Between 7 p.m. - 6 a.m., weekends and holidays:  Call [facility] at [phone] or [phone].    PLEASE SEE WRITTEN HANDOUTS AS DISCUSSED BY YOUR NURSE:      SIGNS AND SYMPTOMS OF A WOUND / INCISION INFECTION   Be sure to watch for the following:  Increase in redness or red streaks near or around the wound or incision.  Increase in pain that is intense or severe and cannot be relieved by the pain medication that your doctor has given you.  Increase in swelling that cannot be relieved by elevation of a body part, or by applying ice, if permitted.  Increase in drainage, or if yellow / green in color and smells bad. This could be on a dressing or a cast.  Increase in fever for longer than 24 hours, or an increase that is higher than 101 degrees Fahrenheit (normal body temperature is 98 degrees Fahrenheit). The incision may feel warm to the touch.    **CALL YOUR DOCTOR IF ONE OR MORE OF THESE SIGNS / SYMPTOMS SHOULD OCCUR.    ANESTHESIA INFORMATION   ANESTHESIA -- ADULT PATIENTS:  You have received intravenous sedation / general anesthesia, and you may feel drowsy and light-headed for several hours. You may even experience some forgetfulness of the procedure. DO NOT DRIVE A MOTOR VEHICLE or perform any activity requiring complete alertness or coordination until you feel fully awake in about 24-48 hours. Do not drink alcoholic beverages for at least 24 hours. Do not stay alone, you must have a responsible adult available to be with you. You may also experience a dry mouth or nausea for 24 hours. This is a normal side effect and will disappear as the effects of the medication wear off.    REMEMBER   If you experience any difficulty breathing, chest pain, bleeding that you  feel is excessive, persistent nausea or vomiting or for any other concerns:  Call your physician Dr. Prudhomme at [phone] or [phone]. You may also ask to have the doctor on call paged. They are available to you 24 hours a day.    SPECIAL INSTRUCTIONS / COMMENTS       FOLLOW-UP APPOINTMENTS   Please call patient services at [phone] or [phone] to schedule a date / time of return. They are open Monday - Friday from 7:30 am - 5:00 pm.

## 2018-12-30 NOTE — Brief Op Note (Signed)
[facility]                                                     BRIEF OPERATIVE NOTE    Patient Name: [facility] Number: [id number]  Date of Service: 12/30/2018  Date of Birth: 1965-12-19      Pre-Operative Diagnosis: Bilateral carpal tunnel syndrome   Post-Operative Diagnosis: same  Procedure(s)/Description:  Bilateral endoscopic carpal tunnel release  Findings: see operative report     Attending Surgeon: Frederik Schmidt  Assistant(s): Donney Caraveo    Tourniquet Time: R side: 20 min; L side: 25 min  Anesthesia Type: General LMA anesthesia  Estimated Blood Loss:  Minimal  Blood Given: None  Fluids Given: see anesthesia report   Complications:  None  Wound Class: Clean Wound: Uninfected operative wounds in which no inflammation occurred    Tubes: None  Drains: None  Specimens/ Cultures: None  Implants: None           Disposition: PACU - hemodynamically stable.  Condition: stable      Jerl Mina, MD  12/30/2018, 13:28

## 2019-01-04 NOTE — OR Surgeon (Signed)
PAT[name]  HOSPITAL NUMBER:  [id number]  DATE OF SERVICE: 12/30/2018  DATE OF BIRTH:  27-Oct-1965    OPERATIVE REPORT    PREOPERATIVE DIAGNOSIS:  Bilateral carpal tunnel syndrome.    POSTOPERATIVE DIAGNOSIS:  Bilateral carpal tunnel syndrome.    NAME OF PROCEDURE:  Bilateral endoscopic carpal tunnel release.    SURGEON:  Mattie MarlinJoseph Jaspal Pultz, MD.    ASSISTANT:  Johnn HaiJulie E Glener, MD.    ANESTHESIA:  Laryngeal mask airway general.    ESTIMATED BLOOD LOSS:  Trace.    TOURNIQUET TIME:  25 minutes on the left, 20 minutes on the right.    SPECIMEN:  None.    DRAINS:  None.    IMPLANTS:  None.    WOUND TYPE:  Clean.    COMPLICATIONS:  None.    DISPOSITION:  The patient was taken to the recovery room awake and extubated with stable vital signs in good condition.  She tolerated the procedure well.    DESCRIPTION OF PROCEDURE:  Ms. Courtney [name] was taken to the operating room.  She was placed on the table in the supine position.  General anesthesia was induced.  She was given antibiotics as prophylaxis against infection.  Well-padded tourniquets were placed about both upper extremities. Isolation drapes were placed below the tourniquets.  The arms and hands were then triple prepped first with chlorhexidine scrub, then an alcohol bath which was allowed to dry, then a ChloraPrep.  She was draped in a sterile fashion.  A timeout was done and documented.  The left arm was done first.  The arm was exsanguinated with an Esmarch.  The tourniquet was raised to 250 mmHg.  An endoscopic carpal tunnel release was then performed using the [facility], the landmarks were easy to localize.  I opened the proximal portal through the skin only with a 15 blade.  Blunt dissection was done down to the forearm fascia.  I lifted up the fascia and incised it longitudinally with tenotomy scissors.  I cleaned above and below it and divided it down to the distal wrist flexor crease.  I then placed a curved dissector beneath it and  cleaned along the underside of the transverse carpal ligament, staying along the ulnar aspect and hugging the hook of the hamate distally.  I then placed the obturator and slotted cannula assembly in the same path and advanced it until it exited the canal distally.  I then backed it up so it was underneath the edge of the canal.  I placed the wrist into extension of the ramp.  I used the palmar arch suppressor while I advanced the obturator and cannula again until it tented the skin.  This was exactly in the expected exit portal location.  I opened the skin with a 15 blade.  I used the palmar arch suppressor while I advanced the obturator and cannula assembly out.  I then rotated them 355 degrees in a radial direction leaving it in a slightly ulnar inclination.  I withdrew the obturator. I dried the cannula with Q-Tips.  I placed the camera and focused it.  I photographed the ligament which was well exposed.  I then divided the ligament using the 3 knives especially designed for that purpose in their traditional sequence.  I used the white handled knife distally to make the first cut. I then made a center cut with the triangle knife. I connected the center and the distal incisions in the ligament using the hoock knife. This  made the distal half of the ligament open up and that began to push down through the opening.  I then reversed the camera so it was distal looking at the proximal end of the ligament.  I divided the proximal most portion of the ligament with the white-handled knife and completed the division using the hook knife to connect the center of the proximal incision. This left the ligament edges separated back widely out of view on either side.  Fat herniated down into the cannula from end to end, indicating complete release.  This was photographed.  I then rotated the slot in the cannula to the side and photographed the cut edge of the ligament.  After confirming complete release by all those methods, I  removed the camera, I replaced the obturator.  I then took the wrist out of extension off the ramp.  I removed the obturator and cannula assembly from the field.  I placed Ragnell retractors in the portals, lifting upward most of the path of the median nerve was nicely seen.  It was well decompressed, it was not injured.  The portals were then copiously irrigated with normal saline.  I closed them with buried 4-0 Monocryl subcuticular closure.  I injected the skin edges with 0.25% plain Marcaine for postoperative analgesia.  I then placed a sterile dressing with Adaptic, folded 4 x 4's, and 2-inch Ace wrap.  I let down the tourniquet.  Excellent circulation returned the hand. There was no significant bleeding.  I then went to the right hand and repeated the same procedure.  I used the same landmarks and the same tools.  I gained exposure nicely and photographed the ligament.  I then photographed the division. I released the ligament in the same sequence and again, obtained a thorough and complete release, evidenced by:  1. Fat herniating down into the cannula from end to end.    2. The probe was used to show that there was no further transverse crossing of fibers.   3. The cut edges of the ligament was retracted back on either side which could be seen by rotating the cannula.    After photographing this, I again removed the camera.  I replaced the obturator. I took the obturator and cannula assembly out after taking the wrist off the extension ramp.  I placed Ragnell retractors into the portals.  I inspected the nerve and saw that it was well decompressed, it was not injured.  I then copiously irrigated the portals with normal saline.  I closed them with buried 4-0 Monocryl just like the other side. I injected the skin edges with 0.25% plain Marcaine for postoperative analgesia.  I placed an Adaptic, some folded 4x4s and a 2-inch Ace wrap just as before, then let down the tourniquet.  Again good circulation  immediately returned to the hand.  There was no significant bleeding.  Having completed both procedures, the patient was then undraped. She was returned to the care of the anesthesia team for awakening and extubation prior to being taken to the recovery room.  She was given detailed discharge instructions in written and oral form and encouraged to call if she had any problems or questions.        Ali Lowe, MD  Associate Professor, [place] Department of Orthopaedics              DD:  01/03/2019 17:21:47  DT:  01/04/2019 12:18:07 CK  D#:  [id number]

## 2019-01-12 ENCOUNTER — Ambulatory Visit: Payer: 59 | Attending: SURGERY OF THE HAND | Admitting: SURGERY OF THE HAND

## 2019-01-12 ENCOUNTER — Other Ambulatory Visit: Payer: Self-pay

## 2019-01-12 DIAGNOSIS — G5601 Carpal tunnel syndrome, right upper limb: Secondary | ICD-10-CM | POA: Insufficient documentation

## 2019-01-12 DIAGNOSIS — Z9889 Other specified postprocedural states: Secondary | ICD-10-CM | POA: Insufficient documentation

## 2019-01-12 DIAGNOSIS — M5412 Radiculopathy, cervical region: Secondary | ICD-10-CM | POA: Insufficient documentation

## 2019-01-12 DIAGNOSIS — G5600 Carpal tunnel syndrome, unspecified upper limb: Secondary | ICD-10-CM

## 2019-01-12 DIAGNOSIS — G5602 Carpal tunnel syndrome, left upper limb: Secondary | ICD-10-CM | POA: Insufficient documentation

## 2019-01-12 DIAGNOSIS — G629 Polyneuropathy, unspecified: Secondary | ICD-10-CM | POA: Insufficient documentation

## 2019-01-12 NOTE — Progress Notes (Signed)
[facility]  [place], [place] [id number]    PATIENT NAME: [facility]evin [facility]goodLisa [facility]  HOSPITAL NUMBER: [id number]  DATE OF SERVICE: 01/12/2019  DATE OF BIRTH: 07/16/1965    PROGRESS NOTE    DATE OF SURGERY: 12/30/2018  Bilateral endoscopic carpal tunnel release    CHIEF COMPLAINT:  Post op visit #1    SUBJECTIVE:   Courtney [name] is a 53 y.o. female established patient presenting to clinic today for his first post operative visit after the above noted procedure. The patient was last evaluated in our clinic 11/29/2018 at which time EMG from 07/2018 showed abnormal findings consistent with severe right carpal tunnel syndrome, and mild-to-moderate left carpal tunnel syndrome. She was given the option to have both done endoscopically in the main OR, or to have her right hand done first under strict local, and she opted to proceed with having both hands done at the same time.     Presently the patient states she has a hard time telling how much improvement she has had since surgery. She still has some pain in her hands, but states it is getting better each day. She did have some numbness that would wake her up in the middle of the night before surgery, which has since resolved. She is currently following with Dr. Shea EvansEmery for neck and right shoulder pain. She notes that she still continues to have pain to the radial nerve distribution of her right hand/arm and wonders if it may be from her neck or shoulder. When she experiences this pain it typically shoots down her right middle finger, but occurs sporadically. She has experienced this pain since carpal tunnel surgery. She was going to physical therapy for her right shoulder and is wondering if she is able to return. The patient denies any new complaints or concerns at this time.      OBJECTIVE:   There were no vitals taken for this visit.  Constitutional: NAD. Well appearing.   HENT:   Head: NC. AT.  Neck: Free ROM of  the neck.   Pulmonary/Chest: No respiratory distress. Respirations are non-labored.            Musculoskeletal: Shoulders level. Spine straight.    Examination of bilateral upper extremities: It looks like a suture was starting to spit out, otherwise there is no erythema or edema. Incisions are healing well without evidence of infection. Full ROM of both hands.    Sensation is intact.   Skin: No rash.  Hemo/lymphatic: No lymphedema noted on visible body parts. No extensive bruising visualized.  Psychiatric: Mood and affect appropriate to the clinical situation.  Neurological: Patient is able to ambulate with a symmetrical, steady gait.      IMAGING:   No images were taken in clinic today.     ASSESSMENT:  53 y.o. female presents 13 days out from bilateral endoscopic carpal tunnel release. She is doing most of normal tasks with both hands, although with a little modification. She has a sharp pain down back of long finger, and up arm on occasion. Don't think long finger related to carpal tunnel, concern cervical radiculopathy although it did not show up on her EMG studies, so it shouldn't be severe. 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    PLAN:   The patient's prior charts were reviewed. Patient has done well post operatively  with improvement of carpal tunnel symptoms. She was encouraged to use her hands as normal, but suggested she consider wearing full fingered gloves while doing certain activities which will help with grip, as well as provide a layer of padding. She should have improvement of jar opening strength over the next 6-8 weeks. Advised her that it will take about 6-7 months for her to fully heal from carpal tunnel release. In regard to her right shoulder and neck pain she was strongly encouraged to continue follow up with Dr. Warren Lacy. She was advised to pay attention to the pattern of  the pain in her right hand/arm. If she were to develop pain higher up in her arm/shoulder it was suggested she put her arm up with her hand on her head and see if this resolves the pain. Suggested she take Motrin PRN for pain. We will follow up with the patient in 6 weeks, or sooner if needed. All questions were answered. The patient appeared satisfied with the plan of treatment. If the patient has any questions or concerns, the patient is free to contact us.    I am scribing for, and in the presence of, Dr. Burton Apley, for services provided on 01/12/2019.     Emmaline Life Brynda Peon, 01/12/2019, 14:13  Hand & Upper Extremity Surgery  [facility] of [place] [facility]    Broadus [name] Juel Burrow, MD  Associate Professor  [facility] Endowed Chair of Orthopaedic Surgery  Department of [place] [facility]    I personally performed the services described in this documentation, as scribed  in my presence, and it is both accurate  and complete.    Ali Lowe, MD

## 2019-02-23 ENCOUNTER — Encounter (HOSPITAL_BASED_OUTPATIENT_CLINIC_OR_DEPARTMENT_OTHER): Payer: Self-pay | Admitting: SURGERY OF THE HAND

## 2019-02-28 ENCOUNTER — Encounter (HOSPITAL_BASED_OUTPATIENT_CLINIC_OR_DEPARTMENT_OTHER): Payer: Self-pay | Admitting: SURGERY OF THE HAND

## 2019-03-11 ENCOUNTER — Other Ambulatory Visit: Payer: Self-pay

## 2019-03-21 DIAGNOSIS — N23 Unspecified renal colic: Secondary | ICD-10-CM | POA: Insufficient documentation

## 2019-04-07 ENCOUNTER — Ambulatory Visit (INDEPENDENT_AMBULATORY_CARE_PROVIDER_SITE_OTHER): Payer: 59 | Admitting: Neurology

## 2019-04-07 VITALS — BP 130/70 | HR 60 | Temp 96.8°F | Ht 60.0 in | Wt 181.0 lb

## 2019-04-07 DIAGNOSIS — G629 Polyneuropathy, unspecified: Secondary | ICD-10-CM

## 2019-04-07 DIAGNOSIS — M47812 Spondylosis without myelopathy or radiculopathy, cervical region: Secondary | ICD-10-CM

## 2019-04-07 NOTE — Progress Notes (Signed)
NEUROLOGY CLINIC, [place]  Operated by [facility]  [address]  [place] [address]  Dept: [phone]  Dept Fax: [phone]    Return Outpatient Note    Date:  04/07/2019  Age:  53 y.o.  Referring Physician:   Belinda Block, DO  [address]  [place], [place] [address]    Subjective:   Pt states that she is having worsening pain and numbness in her lower extremities. The pain starts in the bilateral feet. The pt is also c/o mild neck pain that she has been worked up for. She denies having CP, SOB, abdominal pain, and fever.    Objective:   Vital Signs:  There were no vitals taken for this visit.      Examination:  Exam reveals a healthy appearing patient.    Discs are flat. No APD.    No Bruit head or neck.    Orientation is normal.  Memory is normal.  Attention is normal.  Knowledge is normal.  Language is normal.  Speech is normal.    Cranial Nerves:  2-12 Normal              Gait Normal  Coordination Normal.  Sensory, pin decreased feet  Tone normal.    Motor  Normal strength throughout    Reflexes  1-2+ throughout  Toes down  Ankle jerks are decreased bilaterally.        Data Reviewed:    I have reviewed the following:  Patient Active Problem List    Diagnosis   . Lumbago   . Low back pain   . Lumbar radiculopathy   . Numbness and tingling   . S/P lumbar fusion   . Lumbar stenosis   . Spondylolisthesis, unspecified spinal region   . Tight posterior capsule of right shoulder       Assessment:  No diagnosis found.    Orders  No orders of the defined types were placed in this encounter.    Current Outpatient Medications   Medication Sig   . acetaminophen-codeine (TYLENOL #3) 300-30 mg Oral Tablet Take 1 Tab by mouth Every 4 hours as needed (Patient not taking: Reported on 01/12/2019)   . ascorbic acid, vitamin C, (VITAMIN C) 500 mg Oral Tablet Take 500 mg by mouth Once a day   . calcium citrate-vitamin D3 (CITRACAL) 200 mg calcium -250 unit Oral Tablet Take by mouth Once a day   .  Cholecalciferol, Vitamin D3, (VITAMIN D) 1,000 unit Oral Capsule Take by mouth   . cyanocobalamin (VITAMIN B 12) 1,000 mcg Oral Tablet Take by mouth Once a day    . estradiol (ESTRACE) 0.5 mg Oral Tablet Take 0.5 mg by mouth Once a day   . ferrous sulfate (FERATAB) 324 mg (65 mg iron) Oral Tablet, Delayed Release (E.C.) Take 324 mg by mouth Every other day    . fluocinolone (SYNALAR) 0.025 % Cream by Apply Topically route Twice daily   . gemfibrozil (LOPID) 600 mg Oral Tablet Take 600 mg by mouth Every other day    . levothyroxine (SYNTHROID) 50 mcg Oral Tablet Take 50 mcg by mouth Once a day   . losartan (COZAAR) 50 mg Oral Tablet Take 50 mg by mouth Once a day   . magnesium oxide (MAG-OX) 400 mg (241.3 mg magnesium) Oral Tablet Take 400 mg by mouth Once a day    . metFORMIN (GLUCOPHAGE) 500 mg Oral Tablet Take 500 mg by mouth Once a  day    . MILK THISTLE ORAL Take by mouth   . montelukast sodium (MONTELUKAST ORAL) Take by mouth Every night    . pantoprazole (PROTONIX) 40 mg Oral Tablet, Delayed Release (E.C.) Take 40 mg by mouth Once per day as needed        Plan  Return to the clinic PRN. She will try to see a rheumatologist in [place] or [place].    Total face-to-face time by staff:   minutes. Greater than 50% of that time was spent on counseling/coordination of care regarding:       Governor Rooks, M.D.  Professor   [facility] Department of Neurology

## 2019-05-02 ENCOUNTER — Encounter (INDEPENDENT_AMBULATORY_CARE_PROVIDER_SITE_OTHER): Payer: 59 | Admitting: Orthopaedic Surgery of the Spine

## 2019-06-17 DIAGNOSIS — Z20822 Contact with and (suspected) exposure to covid-19: Secondary | ICD-10-CM | POA: Insufficient documentation

## 2019-08-22 ENCOUNTER — Encounter (INDEPENDENT_AMBULATORY_CARE_PROVIDER_SITE_OTHER): Payer: 59 | Admitting: Orthopaedic Surgery of the Spine

## 2019-09-05 ENCOUNTER — Encounter (INDEPENDENT_AMBULATORY_CARE_PROVIDER_SITE_OTHER): Payer: Self-pay | Admitting: Neurology

## 2019-09-05 NOTE — Telephone Encounter (Signed)
-----   Message from Melina Modena, MD sent at 09/05/2019  3:09 PM EST -----  Regarding: RE: Referral Question  -please make the referral.  Leg pain.  Melina Modena, MD  09/05/2019, 15:09  ----- Message -----  From: Lamont Dowdy, RN  Sent: 09/05/2019   9:20 AM EST  To: Melina Modena, MD  Subject: Referral Question                                ----- Message from Lamont Dowdy, RN sent at 09/05/2019  9:20 AM EST -----       ----- Message from Walski, Nevin Bloodgood to Melina Modena, MD sent at 09/05/2019  8:43 AM -----   I saw Dr. Quentin Mulling on April 07, 2019.  He recommended I see a Rheumatologist.  I was supposed to let him know who I wanted to see.  I would like to be referred to Dr. Claudell Kyle in [place], [place].  Her address is [address] [place], [place]  [address]  Phone: [phone].      Thank you.  Rosezella [place]. Michelli

## 2019-09-05 NOTE — Telephone Encounter (Signed)
Would we refer or PCP?  Dx?

## 2019-09-05 NOTE — Telephone Encounter (Signed)
-----   Message from [place]. Graver sent at 09/05/2019  8:43 AM EST -----  Regarding: Referral Question  Contact: [phone]  I saw Dr. Quentin Mulling on April 07, 2019.  He recommended I see a Rheumatologist.  I was supposed to let him know who I wanted to see.  I would like to be referred to Dr. Claudell Kyle in [place], [place].  Her address is [address] [place], [place]  [address]  Phone: [phone].      Thank you.  Rosezella [place]. Fern

## 2019-09-06 NOTE — Telephone Encounter (Signed)
Referral faxed

## 2019-09-19 ENCOUNTER — Ambulatory Visit: Payer: 59 | Attending: Orthopaedic Surgery of the Spine | Admitting: Orthopaedic Surgery of the Spine

## 2019-09-19 ENCOUNTER — Ambulatory Visit (HOSPITAL_BASED_OUTPATIENT_CLINIC_OR_DEPARTMENT_OTHER): Payer: 59

## 2019-09-19 ENCOUNTER — Encounter (INDEPENDENT_AMBULATORY_CARE_PROVIDER_SITE_OTHER): Payer: Self-pay | Admitting: Orthopaedic Surgery of the Spine

## 2019-09-19 ENCOUNTER — Other Ambulatory Visit: Payer: Self-pay

## 2019-09-19 DIAGNOSIS — M542 Cervicalgia: Secondary | ICD-10-CM | POA: Insufficient documentation

## 2019-09-19 DIAGNOSIS — Z981 Arthrodesis status: Secondary | ICD-10-CM

## 2019-09-19 DIAGNOSIS — M4802 Spinal stenosis, cervical region: Secondary | ICD-10-CM | POA: Insufficient documentation

## 2019-09-19 DIAGNOSIS — M545 Low back pain, unspecified: Secondary | ICD-10-CM

## 2019-09-19 DIAGNOSIS — M4326 Fusion of spine, lumbar region: Secondary | ICD-10-CM | POA: Insufficient documentation

## 2019-09-19 DIAGNOSIS — M5020 Other cervical disc displacement, unspecified cervical region: Secondary | ICD-10-CM

## 2019-09-19 NOTE — Progress Notes (Signed)
To be dictated  Arta Silence, MD 09/19/2019, 10:56

## 2019-09-20 NOTE — Progress Notes (Signed)
PATIENT NAME: Courtney [name], [facility] NUMBER:  [id number]  DATE OF SERVICE: 09/19/2019  DATE OF BIRTH:  [birth year]    PROGRESS NOTE    SUBJECTIVE:  Jolena is here for followup of her neck symptoms.  She is over 2 years, almost 3 years, status post lumbar decompression and fusion.  She is doing very well from that.  She is not having leg pain and minimal back pain.  She has had a recent carpal tunnel surgery by Dr. Osvaldo Human and that is doing well on the right hand.  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  This is primarily at C6-C7.     She thinks her gait and balance are about the same.  She is not noticing radiculopathy in her arms.  She has neck pain.  She has good days and bad days.  She is not noticing weakness in her arms.  She occasionally has trouble with buttons and fine motor.    OBJECTIVE:  On exam, her gait is unremarkable.  I do not pick up any motor weakness in her uppers.  Her reflexes are 2+ and symmetric in the uppers and lowers.  She has no Hoffmann's.    STUDIES:  I reviewed her studies.  She does have some stenosis at C6-C7.  We did x-ray her low back today and that looks quite good.    ASSESSMENT AND PLAN:  We are going to watch and wait with her neck.  She does have a bad right hip and she is well aware of this.  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  Her lumbar films go up to L3.  She had of surgery at L5-S1 in the 1980s and we extended her up to L3 which looks good.     I will see her back in 6 months.  At that time, we will check her for any change with respect to myelopathy symptoms.  She does not need lumbar films at that time, and she probably does not need cervical films unless she is getting worse.        Arta Silence, MD  Professor and Chair   [place] Department of Orthopaedics               CC:   Melina Modena, MD   [address] [facility]   3rd [address] [place], [place] [address]     Burgess Estelle  Hancock, DO   [address]   [place], [place] [address]       DD:  09/19/2019 12:37:50  DT:  09/20/2019 02:19:25 MH  D#:  [id number]

## 2019-11-14 ENCOUNTER — Encounter (INDEPENDENT_AMBULATORY_CARE_PROVIDER_SITE_OTHER): Payer: Self-pay | Admitting: Neurology

## 2019-11-15 NOTE — Telephone Encounter (Addendum)
Please advise your recommendation, and I will follow up with the patient.  Thank you,  [name], RN  11/15/2019, 10:54    ----- Message from Courtney [name] sent at 11/14/2019  7:18 PM EDT -----  Regarding: Non-Urgent Medical Question  Contact: [phone]  Hi Dr. Quentin Mulling,    I would like to know your thoughts on getting the Covid19 vaccine?  I have been trying to decide whether or not to get it due to my neuropathy.  I do not want to possibly make it any worse.     Thanks,  Courtney [name]

## 2019-12-30 ENCOUNTER — Other Ambulatory Visit: Payer: Self-pay

## 2020-05-02 ENCOUNTER — Ambulatory Visit (INDEPENDENT_AMBULATORY_CARE_PROVIDER_SITE_OTHER): Payer: 59 | Admitting: Podiatrist

## 2020-05-02 ENCOUNTER — Ambulatory Visit
Admission: RE | Admit: 2020-05-02 | Discharge: 2020-05-02 | Disposition: A | Discharge status: Home / Self Care | Payer: 59 | Source: Ambulatory Visit | Attending: Podiatrist | Admitting: Podiatrist

## 2020-05-02 ENCOUNTER — Encounter (INDEPENDENT_AMBULATORY_CARE_PROVIDER_SITE_OTHER): Payer: Self-pay | Admitting: Podiatrist

## 2020-05-02 ENCOUNTER — Other Ambulatory Visit: Payer: Self-pay

## 2020-05-02 VITALS — BP 174/88 | HR 86 | Temp 98.7°F | Ht 60.0 in | Wt 181.0 lb

## 2020-05-02 DIAGNOSIS — M2042 Other hammer toe(s) (acquired), left foot: Secondary | ICD-10-CM

## 2020-05-02 DIAGNOSIS — M205X2 Other deformities of toe(s) (acquired), left foot: Secondary | ICD-10-CM

## 2020-05-02 DIAGNOSIS — M79675 Pain in left toe(s): Secondary | ICD-10-CM

## 2020-05-02 DIAGNOSIS — M79672 Pain in left foot: Secondary | ICD-10-CM

## 2020-05-02 DIAGNOSIS — M79673 Pain in unspecified foot: Secondary | ICD-10-CM

## 2020-05-02 DIAGNOSIS — G8929 Other chronic pain: Secondary | ICD-10-CM

## 2020-05-02 DIAGNOSIS — M25775 Osteophyte, left foot: Secondary | ICD-10-CM

## 2020-05-02 NOTE — H&P (Signed)
PODIATRY CLINIC  DEPARTMENT OF ORTHOPAEDICS  [facility]    Physician Office Center  [address] [facility]  [place], [place] [address]    PATIENT NAME:  Courtney [name]  DATE OF BIRTH:  1965/09/01  MRN:  [id number]  DATE OF SERVICE:  05/02/2020    HISTORY OF PRESENT ILLNESS:   54 y.o. Non-Hispanic female presents for evaluation of chief complaint of her left toes crossing. The patient has followed with two other physicians near her home regarding the current symptoms. The first physician told the patient that she likely has a ruptured tendon that cannot be repaired. The second physician told the patient that she had a dislocation that could be surgically repaired. She has tried a Budin splint, silicone sleeves, and taping her toes to keep them from crossing. The patient currently reports discomfort and intermittent pain in her left toes, and states her symptoms are noticeable when she ambulates.  Pain is rated 3/10.  Denies history of an injury to the left toes. The patient underwent lesser digit hammer toe repair to three of her right toes in the 1990's. The patient has a maternal family history of rheumatoid arthritis, but denies a personal history of the diagnosis. She additionally reports a family history of diabetes in both of her parents, but denies a personal history of diabetes. The patient resides in [place], [place]. She has a past medical history of neuropathy and gout. Denies a history of chemotherapy treatment.  She has a past surgical history significant for a left hip replacement. The patient works as a Diplomatic Services operational officer, is married and lives with her spouse.  She is a former smoker.       PAST MEDICAL HISTORY:   Past Medical History:   Diagnosis Date    Anemia 12/29/2018    iron Deficiency     Arthritis     Asthma 12/29/2018    childhood, no inhalers since    Depression 12/29/2018    Denies SI/HI     Eczema 12/29/2018    hands x 1 year- eczema- seen derm    Esophageal reflux  12/29/2018    controlled    Fibromyalgia     Gout     Headache     Heart murmur 12/29/2018    Reports she was told this in her 17s    Hx of transfusion 12/29/2018    denies rx    Hyperlipidemia     Hypertension     Hypothyroid     Nausea with vomiting     Peripheral edema 12/29/2018    chronic left lower extremity swelling     Peripheral vascular disease (CMS HCC)     left lower extremity     Pre-diabetes     Problems with swallowing     Shortness of breath 12/29/2018    chronic unchanged shortness of breath    Sleep apnea 12/29/2018    doesn't wear CPAP     Thyroid disease     Wears glasses      PAST SURGICAL HISTORY:   Past Surgical History:   Procedure Laterality Date    Ankle surgery Right     Bladder surgery      Cesarean section      Colonoscopy      Hx hip replacement Left     Hx upper endoscopy      Hysterotomy      Laminectomy      Leg surgery Left      MEDICATIONS:  Current Outpatient Medications   Medication Sig    ascorbic acid, vitamin C, (VITAMIN C) 500 mg Oral Tablet Take 500 mg by mouth Once a day    Bacillus coagulans (DIGESTIVE ADVANTAGE PROB GUMMY) 250 million cell Oral Tablet, Chewable Take by mouth    calcium citrate-vitamin D3 (CITRACAL) 200 mg calcium -250 unit Oral Tablet Take by mouth Once a day    Cholecalciferol, Vitamin D3, (VITAMIN D) 1,000 unit Oral Capsule Take by mouth    cyanocobalamin (VITAMIN B 12) 1,000 mcg Oral Tablet Take by mouth Once a day     ferrous sulfate (FERATAB) 324 mg (65 mg iron) Oral Tablet, Delayed Release (E.C.) Take 324 mg by mouth Every other day     fluocinolone (SYNALAR) 0.025 % Cream by Apply Topically route Twice daily    furosemide (LASIX) 20 mg Oral Tablet furosemide 20 mg tablet   TAKE 1 TABLET BY MOUTH EVERY DAY    Levothyroxine 75 mcg Oral Capsule Every one hour    losartan (COZAAR) 25 mg Oral Tablet losartan 25 mg tablet   TAKE 1 TABLET BY MOUTH EVERY DAY    losartan (COZAAR) 50 mg Oral Tablet Take 50 mg by mouth  Once a day    magnesium oxide (MAG-OX) 400 mg (241.3 mg magnesium) Oral Tablet Take 400 mg by mouth Once a day     montelukast sodium (MONTELUKAST ORAL) Take by mouth Every night     pantoprazole (PROTONIX) 40 mg Oral Tablet, Delayed Release (E.C.) Take 40 mg by mouth Once per day as needed     pantoprazole (PROTONIX) 40 mg Oral Tablet, Delayed Release (E.C.) pantoprazole 40 mg tablet,delayed release    pimecrolimus (ELIDEL) 1 % Cream APPLY TO AFFECTED AREA TWICE A DAY    predniSONE (DELTASONE) 10 mg Oral Tablet     triamcinolone acetonide (ARISTOCORT A) 0.1 % Cream triamcinolone acetonide 0.1 % topical cream     ALLERGIES:   Allergies   Allergen Reactions    Macrodantin [Nitrofurantoin] Shortness of Breath    Morphine Swelling    Zofran [Ondansetron] Nausea/ Vomiting     FAMILY MEDICAL HISTORY:   Family Medical History:    None       SOCIAL HISTORY:   Social History     Occupational History    Not on file   Tobacco Use    Smoking status: Light Tobacco Smoker     Packs/day: 1.00     Years: 25.00     Pack years: 25.00     Types: Cigarettes    Smokeless tobacco: Never Used    Tobacco comment: States a few cigarettes over the last few weeks    Vaping Use    Vaping Use: Never used   Substance and Sexual Activity    Alcohol use: Not Currently     Comment: twice/month    Drug use: Not Currently    Sexual activity: Not on file       REVIEW OF SYSTEMS:   Denies fevers, chills, nausea, vomiting, and shortness of breath.  All other systems reviewed and are negative, except as reported in HPI.      PHYSICAL EXAM:   General:  Appears stated age.  Well-appearing.    Psychiatric:  Pleasant.  Cooperative.  Alert and oriented x 3.    Respiratory:  Lung expansion symmetric.  No active work of breathing.    Heme/Lymphatic:  No lymphadenopathy.    ENMT:  Sclera clear.  Trachea midline.  Mucous membranes  moist.    Vascular:  +2/4 dorsalis pedis and posterior tibial pulses, bilaterally.  Capillary fill time was less  than 3 seconds, bilaterally.  Skin temperature was warm at the level of the toes, bilaterally.  There was no edema noted.   Intact hair growth noted to bilateral tibia.  Skin was of good turgor and texture.    Neurologic:  Gross sensation intact to touch, bilaterally.  No paresthesias noted, bilateral feet. No numbness noted.  Dermatologic:  No open wounds or lesions were noted.  No ecchymosis or erythema noted, bilateral feet.  No areas of hyperpigmentation.  Nails are normotrophic, bilaterally.  No maceration noted to interspaces.    Musculoskeletal: Hammertoe deformities to left 3 lesser toes; partially reducible. Left 2nd digit noted overlapping 3rd digit in abducted position. +5/5 dorsiflexion, plantarflexion, inversion, eversion, intrinsic muscle strength, bilaterally.  Full range of motion, all quadrants, bilaterally.  No pain with range of motion.  No crepitus noted.      RADIOGRAPHS:   1. Left foot with weight-bearing x-ray, obtained on 05/02/2020, personally reviewed and interpreted by myself:   FINDINGS:  Left second through fourth hammer toe deformities are seen, with lateral subluxation at the second through fourth MTP joints. No acute fracture is identified. Joint spaces are preserved. Dorsal osteophytes are present in the midfoot. No significant soft tissue swelling is appreciated.    IMPRESSION:  1.Left second through fourth hammertoe deformities with lateral subluxation at the MTP joints.  2.Mild midfoot degenerative osteophytosis.    ASSESSMENT:   1. Left foot 2nd digit hammertoe abducted and overlapping 3rd digit with pain; failed conservative treatment.     PLAN:   - Discussed clinical exam and radiologic findings and answered all questions.    - Discussed conservative and surgical treatment for the above diagnoses including footwear and activity adjustments, bracing, splinting, offloading pads, and surgical correction of the deformity.  - Toe splint was dispensed.   - As the patient has failed  lengthy conservative treatment, surgical correction of the deformity is warranted.   - Surgical case was submitted for a left foot 2nd digit arthroplasty, capsulorrhaphy with temporary K wire fixation.   - RTC for pre-op evaluation.      This note may have been partially generated using MModal Fluency Direct system and there may be some incorrect words, spellings, and punctuation that were not noted in checking the note before saving.    I am scribing for, and in the presence of, Victorino Sparrow, DPM for services provided on 05/02/2020.   Callie Fielding, SCRIBE     I personally performed the services described in this documentation, as scribed  in my presence, and it is both accurate  and complete.    Victorino Sparrow, DPM    Samara Deist Herschel [place]  Podiatry, Assistant Professor  Department of Orthopaedics  [facility]  Pager [phone]

## 2020-05-08 ENCOUNTER — Encounter (INDEPENDENT_AMBULATORY_CARE_PROVIDER_SITE_OTHER): Payer: Self-pay | Admitting: Podiatrist

## 2020-05-14 ENCOUNTER — Encounter (INDEPENDENT_AMBULATORY_CARE_PROVIDER_SITE_OTHER): Payer: 59 | Admitting: Orthopaedic Surgery of the Spine

## 2020-06-01 ENCOUNTER — Encounter (INDEPENDENT_AMBULATORY_CARE_PROVIDER_SITE_OTHER): Payer: 59 | Admitting: Podiatrist

## 2020-06-01 ENCOUNTER — Encounter (HOSPITAL_COMMUNITY): Payer: Self-pay

## 2020-06-14 IMAGING — MR MRI THORACIC SPINE WITHOUT CONTRAST
4 of 5 series · 26 of 48 positions shown · non-contrast
Comparison: CT chest including thoracic spine dated 05/16/2020.

﻿EXAM:  MRI THORACIC SPINE WITHOUT CONTRAST
INDICATION: Chronic mid back pain.  No history of malignancy or back surgery.
TECHNIQUE: Sagittal, coronal and axial images including T1, T2 and STIR sequences.

[Series 5: T2 · sagittal · 4.0mm · 0.78mm/px · 8 of 13 slices shown (1 of 2)]
[im 1/13]
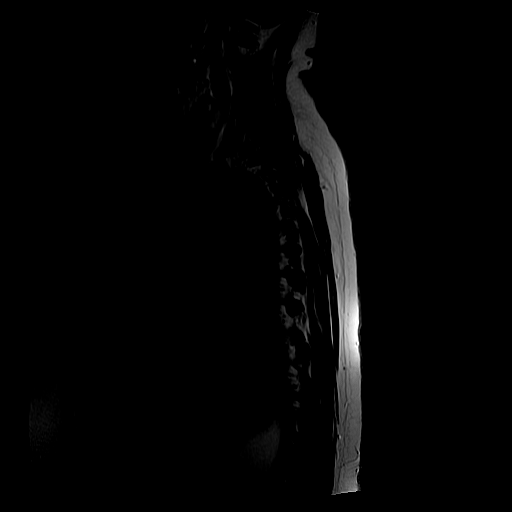
[im 2/13]
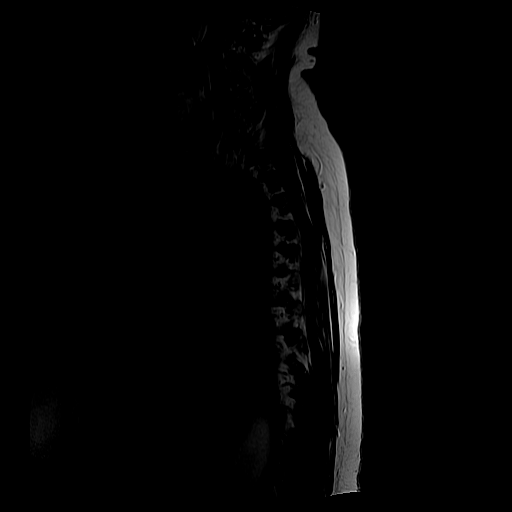
[im 5/13]
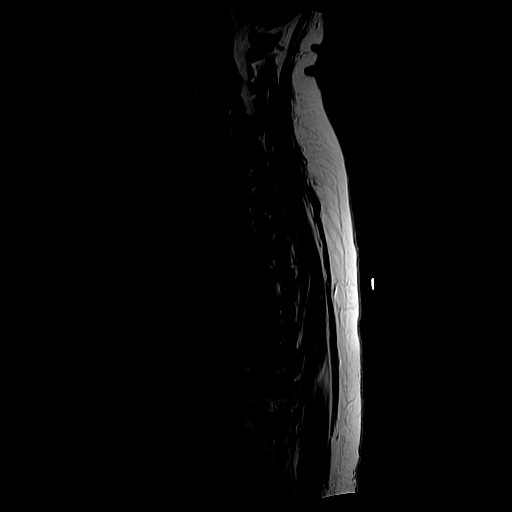
[im 6/13]
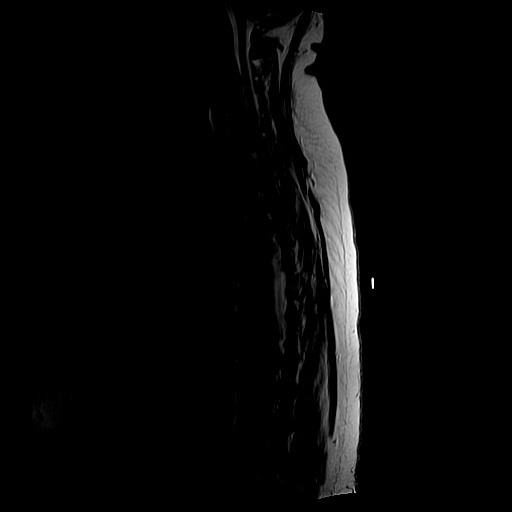
[im 7/13]
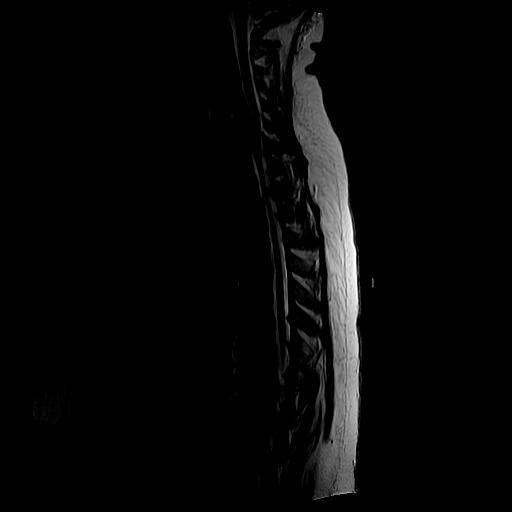
[im 9/13]
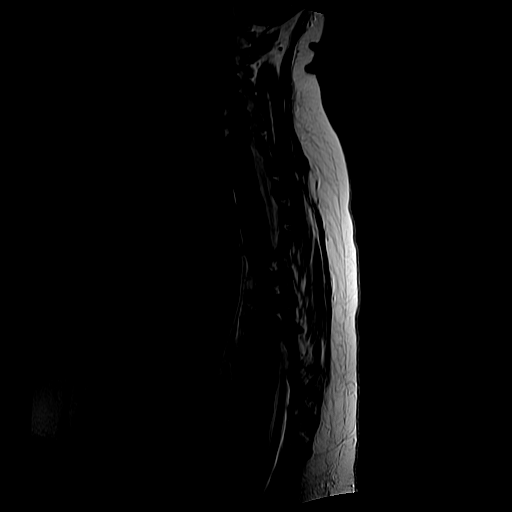
[im 11/13]
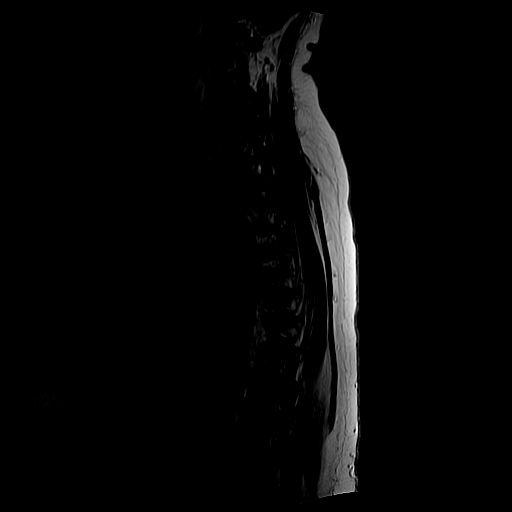
[im 13/13]
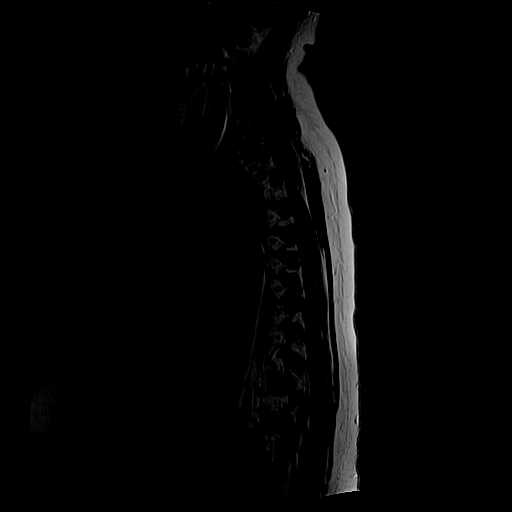

[Series 6: T1 · sagittal · 4.0mm · 0.78mm/px · 6 of 13 slices shown (1 of 2)]
[im 1/13]
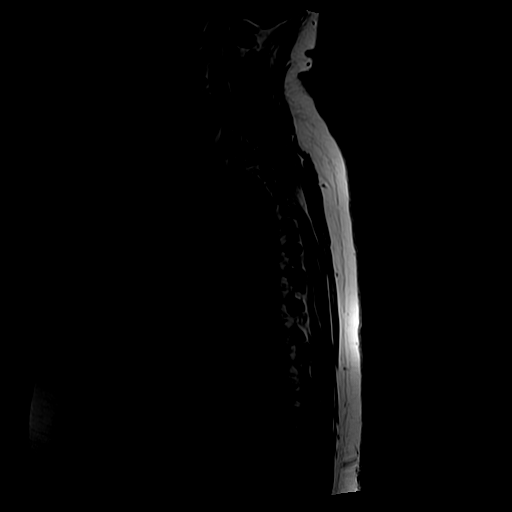
[im 2/13]
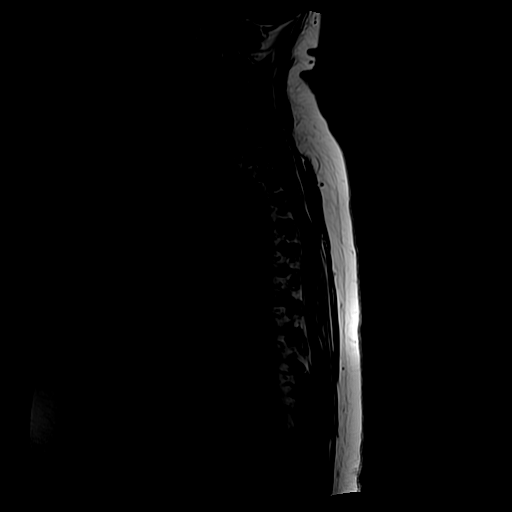
[im 5/13]
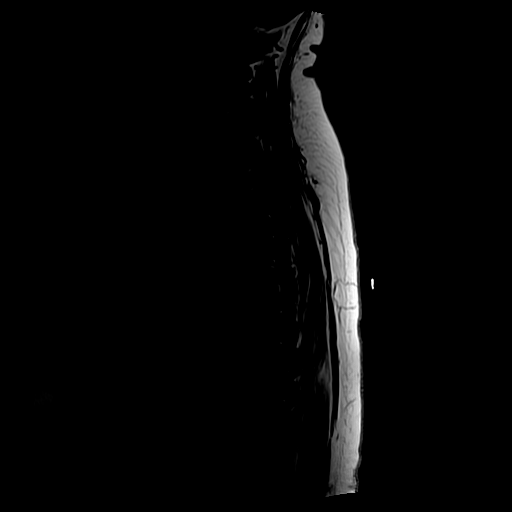
[im 6/13]
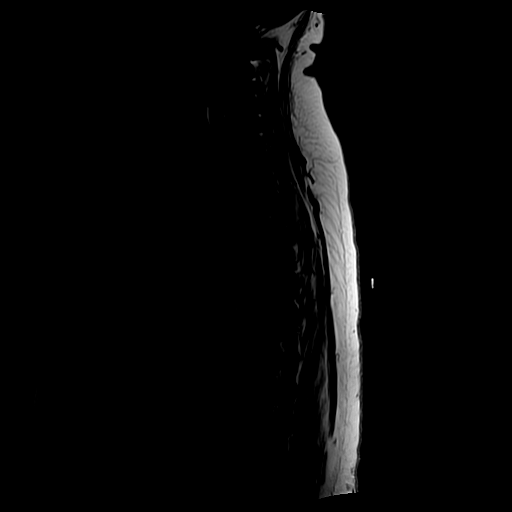
[im 7/13]
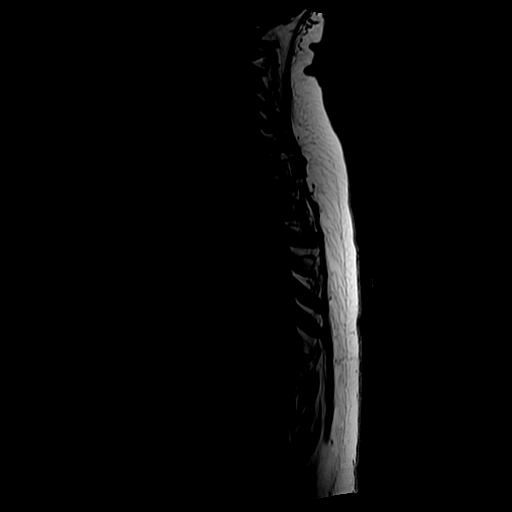
[im 11/13]
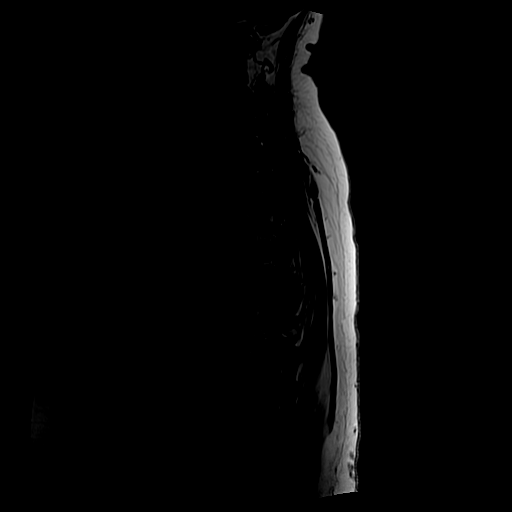

[Series 8: T2 · axial · 4.0mm · 0.62mm/px · z∈[-185,-11]mm · 9 of 12 slices shown (2 of 2)]
[im 1/12]
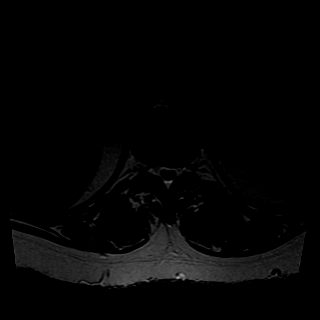
[im 2/12]
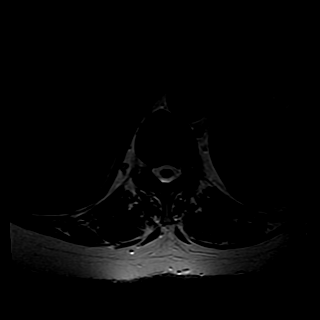
[im 3/12]
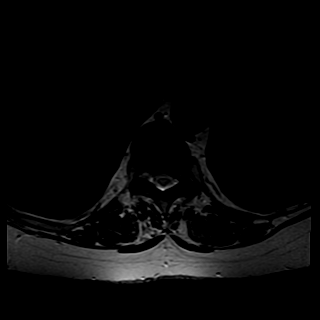
[im 5/12]
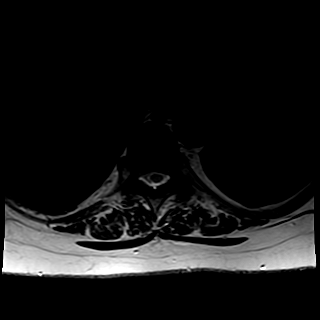
[im 6/12]
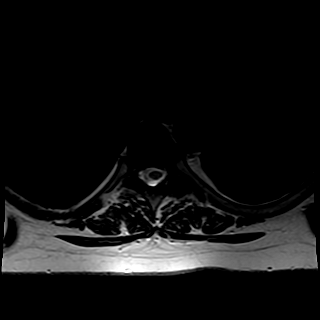
[im 7/12]
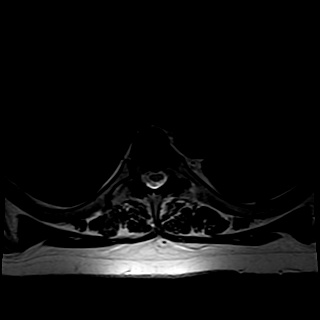
[im 9/12]
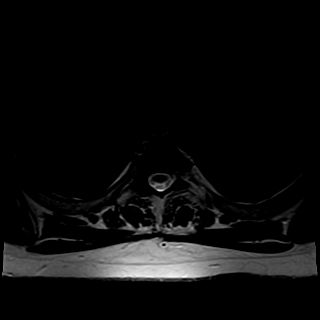
[im 10/12]
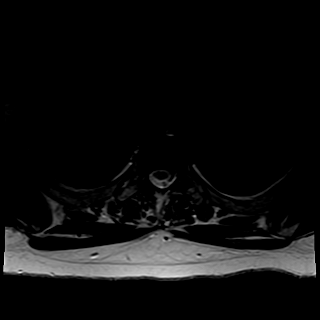
[im 12/12]
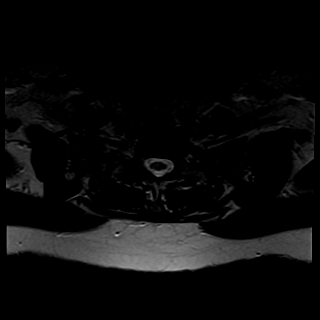

[Series 9: T1 · axial · 4.0mm · 0.62mm/px · z∈[-167,-31]mm · 3 of 12 slices shown (2 of 2)]
[im 2/12]
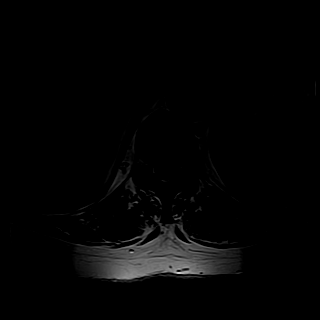
[im 6/12]
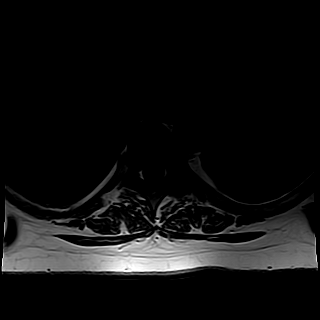
[im 10/12]
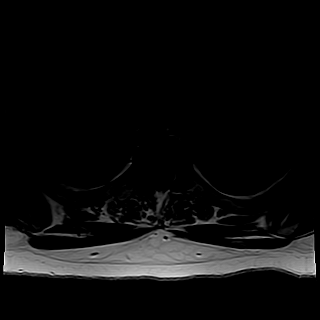

[26 of 48 positions shown; findings below may reference images not displayed]

FINDINGS: No acute bony lesions of thoracic vertebrae are seen.  

Moderately significant degenerative disc changes are seen in the mid thoracic spine with significant facet arthropathy with dorsal ligament hypertrophy at T6-T7, T7-T8 levels.  Mild compromise of thecal sac and neural foramina on both sides at these levels.  

Severe degenerative disc changes at C5-C6 and C6-C7 lower cervical levels.  

Thoracic spinal cord shows no focal lesions.  Paravertebral soft tissues are unremarkable.
IMPRESSION: 1. No acute bone changes of thoracic vertebrae.

2. Moderate degenerative disc changes and facet arthropathy as described above in the mid thoracic spine especially T6-T7, T7-T8 levels.  

3. Severe degenerative disc changes of cervical spine at C5-C6 and C6-C7 levels.  

4. Thoracic spinal cord shows no focal abnormalities.

## 2020-06-25 ENCOUNTER — Encounter (HOSPITAL_COMMUNITY): Admission: RE | Payer: Self-pay | Source: Ambulatory Visit

## 2020-06-25 ENCOUNTER — Ambulatory Visit (HOSPITAL_COMMUNITY): Admission: RE | Admit: 2020-06-25 | Payer: 59 | Source: Ambulatory Visit | Admitting: Podiatrist

## 2020-06-25 SURGERY — ARTHROPLASTY HAMMERTOE CORRECTION
Anesthesia: General | Site: Foot | Laterality: Left

## 2020-07-05 ENCOUNTER — Encounter (INDEPENDENT_AMBULATORY_CARE_PROVIDER_SITE_OTHER): Payer: Self-pay | Admitting: Podiatrist

## 2020-07-05 ENCOUNTER — Encounter (HOSPITAL_COMMUNITY): Payer: Self-pay

## 2020-07-06 ENCOUNTER — Encounter (INDEPENDENT_AMBULATORY_CARE_PROVIDER_SITE_OTHER): Payer: Self-pay | Admitting: Podiatrist

## 2020-07-10 ENCOUNTER — Other Ambulatory Visit (HOSPITAL_COMMUNITY): Payer: Self-pay

## 2020-07-10 LAB — EXTERNAL COVID-19 MOLECULAR RESULT: External 2019-n-CoV/SARS-CoV-2: POSITIVE — AB

## 2020-07-16 ENCOUNTER — Encounter (INDEPENDENT_AMBULATORY_CARE_PROVIDER_SITE_OTHER): Payer: 59 | Admitting: Orthopaedic Surgery of the Spine

## 2020-07-20 ENCOUNTER — Encounter (INDEPENDENT_AMBULATORY_CARE_PROVIDER_SITE_OTHER): Payer: Self-pay | Admitting: Podiatrist

## 2020-07-23 ENCOUNTER — Other Ambulatory Visit: Payer: Self-pay

## 2020-07-27 ENCOUNTER — Encounter (INDEPENDENT_AMBULATORY_CARE_PROVIDER_SITE_OTHER): Payer: Self-pay | Admitting: Podiatrist

## 2020-08-03 ENCOUNTER — Encounter (INDEPENDENT_AMBULATORY_CARE_PROVIDER_SITE_OTHER): Payer: Self-pay | Admitting: Podiatrist

## 2020-08-17 ENCOUNTER — Encounter (INDEPENDENT_AMBULATORY_CARE_PROVIDER_SITE_OTHER): Payer: Self-pay | Admitting: Podiatrist

## 2020-08-29 ENCOUNTER — Encounter (INDEPENDENT_AMBULATORY_CARE_PROVIDER_SITE_OTHER): Payer: Self-pay | Admitting: Podiatrist

## 2020-09-07 ENCOUNTER — Encounter (INDEPENDENT_AMBULATORY_CARE_PROVIDER_SITE_OTHER): Payer: Self-pay | Admitting: Orthopaedic Surgery of the Spine

## 2020-09-17 ENCOUNTER — Encounter (INDEPENDENT_AMBULATORY_CARE_PROVIDER_SITE_OTHER): Payer: 59 | Admitting: Orthopaedic Surgery of the Spine

## 2020-09-26 ENCOUNTER — Other Ambulatory Visit: Payer: Self-pay

## 2020-09-26 ENCOUNTER — Ambulatory Visit: Payer: 59 | Attending: Podiatrist | Admitting: Podiatrist

## 2020-09-26 ENCOUNTER — Encounter (INDEPENDENT_AMBULATORY_CARE_PROVIDER_SITE_OTHER): Payer: Self-pay | Admitting: Podiatrist

## 2020-09-26 DIAGNOSIS — M205X2 Other deformities of toe(s) (acquired), left foot: Secondary | ICD-10-CM | POA: Insufficient documentation

## 2020-09-26 DIAGNOSIS — G8929 Other chronic pain: Secondary | ICD-10-CM | POA: Insufficient documentation

## 2020-09-26 DIAGNOSIS — M79672 Pain in left foot: Secondary | ICD-10-CM | POA: Insufficient documentation

## 2020-09-26 DIAGNOSIS — M79675 Pain in left toe(s): Secondary | ICD-10-CM | POA: Insufficient documentation

## 2020-09-26 NOTE — Progress Notes (Signed)
PODIATRY CLINIC  DEPARTMENT OF ORTHOPAEDICS  [facility]    Physician Office Center  [address] [facility]  [place], [place] [address]    PATIENT NAME:  Courtney [name]  DATE OF BIRTH:  26-Apr-1966  MRN:  [id number]  DATE OF SERVICE:  09/26/2020     HISTORY OF PRESENT ILLNESS:   55 y.o. Non-Hispanic female presents for evaluation of chief complaint of her left toes crossing. Last visit was on 05/02/2020 at which time a surgical case was submitted for a L foot 2nd digit arthroplasty and capsulorrhaphy with temporary K wire fixation. The pt was initially to undergo the surgery in December 2021 but states she began developing issues with her R hip since her last clinic evaluation, and she then elected to postpone the foot surgery until she fixes her hip issues. The pt states she has been taping her L great toe for symptomatic management since her last clinic visit. The pt is scheduled to undergo a R hip replacement on 11/14/2020 with Dr. Lequita Halt in [place], [place]. She states her hip surgeon informed her that she can undergo the L foot surgery 6-8 weeks after her hip surgery. The pt is scheduled to establish care with a vascular surgeon in [place], [place] on 10/08/2020 due to a known splenic aneurysm. The patient underwent lesser digit hammer toe repair to three of her right toes in the 1990's. The patient has a maternal family history of rheumatoid arthritis, but denies a personal history of the diagnosis. She additionally reports a family history of diabetes in both of her parents, but denies a personal history of diabetes. The pt has a past medical history of neuropathy and gout. Denies a history of chemotherapy treatment.  She has a past surgical history significant for a left hip replacement with a subsequent post-op infection resulting in additional surgery, performed by Dr. Lequita Halt. 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   She is a former smoker. The pt reports drug allergies/intolerances to morphine and hydrocodone. She presents to clinic today with her spouse.     REVIEW OF SYSTEMS:   Denies fevers, chills, nausea, vomiting, and shortness of breath.  All other systems reviewed and are negative, except as reported in HPI.      PHYSICAL EXAM:   General:  Appears stated age.  Well-appearing.    Psychiatric:  Pleasant.  Cooperative.  Alert and oriented x 3.    Respiratory:  Lung expansion symmetric.  No active work of breathing.    Heme/Lymphatic:  No lymphadenopathy.    ENMT:  Sclera clear.  Trachea midline.  Mucous membranes moist.    Vascular:  +2/4 dorsalis pedis and posterior tibial pulses, bilaterally.  Capillary fill time was less than 3 seconds, bilaterally.  Skin temperature was warm at the level of the toes, bilaterally.  There was no edema noted.   Intact hair growth noted to bilateral tibia.  Skin was of good turgor and texture.    Neurologic:  Gross sensation intact to touch, bilaterally.  No paresthesias noted, bilateral feet. No numbness noted.  Dermatologic:  No open wounds or lesions were noted.  No ecchymosis or erythema noted, bilateral feet.  No areas of hyperpigmentation.  Nails are normotrophic, bilaterally.  No maceration noted to interspaces.    Musculoskeletal: Hammertoe deformities to left 3 lesser toes; partially reducible. Left 2nd digit noted overlapping 3rd digit and in abducted position. +5/5  dorsiflexion, plantarflexion, inversion, eversion, intrinsic muscle strength, bilaterally.  Full range of motion, all quadrants, bilaterally.  No pain with range of motion.  No crepitus noted.      RADIOGRAPHS:    1. Left foot with weight-bearing x-ray, obtained on 05/02/2020, personally reviewed and interpreted by myself:   FINDINGS:  Left second through fourth hammer toe deformities are seen, with lateral subluxation at the second through fourth MTP joints. No acute fracture is identified. Joint spaces are preserved.  Dorsal osteophytes are present in the midfoot. No significant soft tissue swelling is appreciated.    IMPRESSION:  1.Left second through fourth hammertoe deformities with lateral subluxation at the MTP joints.  2.Mild midfoot degenerative osteophytosis.    ASSESSMENT:    1. Left foot 2nd digit hammertoe abducted and overlapping 3rd digit with pain; failed conservative treatment.     PLAN:    - Discussed clinical exam and radiologic findings and answered all questions.    - Discussed surgical correction of the pt's L foot 2nd digit deformity including post-operative management.   - Surgical case was submitted for a left foot 2nd digit arthroplasty, capsulorrhaphy with temporary K wire fixation.   - RTC for pre-op evaluation.      This note may have been partially generated using MModal Fluency Direct system and there may be some incorrect words, spellings, and punctuation that were not noted in checking the note before saving.    I am scribing for, and in the presence of, Victorino Sparrow, DPM for services provided on 09/26/2020.   Callie Fielding, SCRIBE     I personally performed the services described in this documentation, as scribed  in my presence, and it is both accurate  and complete.    Victorino Sparrow, DPM    Samara Deist Herschel [place]  Podiatry, Assistant Professor  Department of Orthopaedics  [facility]  Pager [phone]

## 2020-09-27 ENCOUNTER — Encounter (INDEPENDENT_AMBULATORY_CARE_PROVIDER_SITE_OTHER): Payer: Self-pay | Admitting: Podiatrist

## 2020-11-07 ENCOUNTER — Encounter (INDEPENDENT_AMBULATORY_CARE_PROVIDER_SITE_OTHER): Payer: Self-pay | Admitting: Podiatrist

## 2020-12-04 ENCOUNTER — Encounter (INDEPENDENT_AMBULATORY_CARE_PROVIDER_SITE_OTHER): Payer: Self-pay | Admitting: Neurology

## 2020-12-14 ENCOUNTER — Encounter (INDEPENDENT_AMBULATORY_CARE_PROVIDER_SITE_OTHER): Payer: 59 | Admitting: Podiatrist

## 2020-12-14 ENCOUNTER — Encounter (INDEPENDENT_AMBULATORY_CARE_PROVIDER_SITE_OTHER): Payer: Self-pay

## 2020-12-14 ENCOUNTER — Encounter (HOSPITAL_COMMUNITY): Payer: Self-pay

## 2021-01-03 ENCOUNTER — Encounter (INDEPENDENT_AMBULATORY_CARE_PROVIDER_SITE_OTHER): Payer: Self-pay | Admitting: Podiatrist

## 2021-01-08 ENCOUNTER — Ambulatory Visit (HOSPITAL_BASED_OUTPATIENT_CLINIC_OR_DEPARTMENT_OTHER): Payer: Self-pay | Admitting: Orthopaedic Surgery

## 2021-01-11 ENCOUNTER — Encounter (INDEPENDENT_AMBULATORY_CARE_PROVIDER_SITE_OTHER): Payer: Self-pay | Admitting: Podiatrist

## 2021-01-18 ENCOUNTER — Encounter (INDEPENDENT_AMBULATORY_CARE_PROVIDER_SITE_OTHER): Payer: Self-pay | Admitting: Podiatrist

## 2021-02-21 ENCOUNTER — Encounter (INDEPENDENT_AMBULATORY_CARE_PROVIDER_SITE_OTHER): Payer: Self-pay

## 2021-03-23 ENCOUNTER — Other Ambulatory Visit: Payer: Self-pay

## 2021-04-04 DIAGNOSIS — R1310 Dysphagia, unspecified: Secondary | ICD-10-CM | POA: Insufficient documentation

## 2021-04-04 DIAGNOSIS — R1013 Epigastric pain: Secondary | ICD-10-CM | POA: Insufficient documentation

## 2021-05-01 ENCOUNTER — Ambulatory Visit: Payer: 59 | Attending: Podiatrist | Admitting: Podiatrist

## 2021-05-01 ENCOUNTER — Inpatient Hospital Stay (HOSPITAL_BASED_OUTPATIENT_CLINIC_OR_DEPARTMENT_OTHER)
Admission: RE | Admit: 2021-05-01 | Discharge: 2021-05-01 | Disposition: A | Discharge status: Home / Self Care | Payer: 59 | Source: Ambulatory Visit

## 2021-05-01 ENCOUNTER — Encounter (INDEPENDENT_AMBULATORY_CARE_PROVIDER_SITE_OTHER): Payer: Self-pay | Admitting: Podiatrist

## 2021-05-01 ENCOUNTER — Other Ambulatory Visit: Payer: Self-pay

## 2021-05-01 DIAGNOSIS — Z8739 Personal history of other diseases of the musculoskeletal system and connective tissue: Secondary | ICD-10-CM | POA: Insufficient documentation

## 2021-05-01 DIAGNOSIS — M2040 Other hammer toe(s) (acquired), unspecified foot: Secondary | ICD-10-CM

## 2021-05-01 DIAGNOSIS — L84 Corns and callosities: Secondary | ICD-10-CM

## 2021-05-01 DIAGNOSIS — M79671 Pain in right foot: Secondary | ICD-10-CM | POA: Insufficient documentation

## 2021-05-01 DIAGNOSIS — M2042 Other hammer toe(s) (acquired), left foot: Secondary | ICD-10-CM

## 2021-05-01 DIAGNOSIS — M2041 Other hammer toe(s) (acquired), right foot: Secondary | ICD-10-CM

## 2021-05-01 DIAGNOSIS — M79672 Pain in left foot: Secondary | ICD-10-CM

## 2021-05-01 DIAGNOSIS — M205X2 Other deformities of toe(s) (acquired), left foot: Secondary | ICD-10-CM | POA: Insufficient documentation

## 2021-05-01 NOTE — Progress Notes (Signed)
[place]  [place], [place] [address]    PATIENT NAME:  Courtney [name]  DATE OF BIRTH:  1966/04/26  MRN:  [id number]  DATE OF SERVICE:  05/01/2021     HISTORY OF PRESENT ILLNESS:   55 y.o. Non-Hispanic female presents for re-evaluation of bilateral foot pain with right currently more painful than left.  Last visit 09/26/2020.  Patient points to her plantar right 1st met head area as site of greatest pain as well as her right hallux interphalangeal joint.  Had previously submitted surgical case for L foot 2nd digit arthroplasty and capsulorrhaphy with temporary K wire fixation however, developed GI infection and then needed  Right hip replacement which was completed 12/19/2020.   Patient rates her right foot pain 6/10 but is much worse with direct pressure.  Also with PMH left hip replacement 2016,  splenic aneurysm, fibromyalgia, hx of gout, pre diabetes, hx of lumbar fusion.  Past surgical hx also includes lesser digit hammertoe repair to toes 2 -4 in the 1990's. 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  She is a former smoker.  She presents to clinic today with her spouse.     REVIEW OF SYSTEMS:   Denies fevers, chills, nausea, vomiting, and shortness of breath.  All other systems reviewed and are negative, except as reported in HPI.      PHYSICAL EXAM:   General:  Appears stated age.  Well-appearing.    Psychiatric:  Pleasant.  Cooperative.  Alert and oriented x 3.    Respiratory:  Lung expansion symmetric.  No active work of breathing.    Heme/Lymphatic:  No lymphadenopathy.    ENMT:  Sclera clear.  Trachea midline.  Mucous membranes moist.    Vascular:  +2/4 dorsalis pedis and posterior tibial pulses, bilaterally.  Capillary fill time was less than 3 seconds, bilaterally.  Skin temperature was warm at the level of the  toes, bilaterally.  There was no edema noted.   Intact hair growth noted to bilateral tibia.  Skin was of good turgor and texture.    Neurologic:  Gross sensation intact to touch, bilaterally.  No paresthesias noted, bilateral feet. No numbness noted.  Dermatologic: right foot sub 1st met head with hypertrophic callus  Approximately 1 cm circular s/p sharp debridement some subcutaneous soft tissue discoloration noted without obvious skin breakdown, positive tender with palpation.  No open wounds or other lesions were noted.  No ecchymosis or erythema noted, bilateral feet.  No areas of hyperpigmentation.  Nails are normotrophic, bilaterally.  No maceration noted to interspaces.    Musculoskeletal: Hammertoe deformities to left 3 lesser toes; partially reducible. Left 2nd digit noted overlapping 3rd digit and in abducted position.   Right foot hallux hammertoe with slight abduction and partially reducible hammertoe deformities 2-5, 5th digit also noted to be slightly adducted.+5/5 dorsiflexion, plantarflexion, inversion, eversion, intrinsic muscle strength, bilaterally.  Full range of motion, all quadrants, bilaterally.  No pain with range of motion.  No crepitus noted.      Imaging:   05/01/2021 XR feet weight-bearing bilateral.  Personally reviewed evaluated by myself:     Left foot 2nd-4th hammertoe deformities with lateral subluxation at 2nd metatarsophalangeal joint as well as 2nd, 3rd toe crossover    Bilateral hallux valgus with bunion  deformityAs well as midfoot arthrosis     Rght foot 1 through 4th hammertoe deformities          ASSESSMENT:      1. Right foot sub 1st met callus with pain likely 2/2 to hallux hammertoe and hypertrophic sesamoids  2.  Hx of right  2nd -4th digit hammertoe deformity with recurrence  3.  Right 5th digit adducted hammertoe deformity  4. Left foot 2nd digit hammertoe abducted and overlapping 3rd digit with pain; failed conservative treatment.     PLAN:    - Discussed clinical exam  and today's radiologic findings and answered all questions.    - Discussed surgical correction of the pt's L foot 2nd digit deformity including post-operative management.   -  Given that patient has failed lengthy conservative treatment, surgical case  Will be submitted for a left foot 2nd digit arthroplasty, capsulorrhaphy with temporary K wire fixation.   -  No guarantees were given or implied regarding surgical outcome  - RTC for pre-op evaluation.      This note may have been partially generated using MModal Fluency Direct system and there may be some incorrect words, spellings, and punctuation that were not noted in checking the note before saving.    On the day of the encounter, a total of  20 minutes was spent on this patient encounter including review of historical information, examination, documentation and post-visit activities.     Curt Bears Aleene Davidson  Podiatry, Assistant Professor  Department of [facility]  Pager [phone]

## 2021-05-06 ENCOUNTER — Encounter (INDEPENDENT_AMBULATORY_CARE_PROVIDER_SITE_OTHER): Payer: Self-pay | Admitting: Podiatrist

## 2021-05-07 DIAGNOSIS — M205X1 Other deformities of toe(s) (acquired), right foot: Secondary | ICD-10-CM

## 2021-05-07 DIAGNOSIS — M205X2 Other deformities of toe(s) (acquired), left foot: Secondary | ICD-10-CM

## 2021-05-07 DIAGNOSIS — M19072 Primary osteoarthritis, left ankle and foot: Secondary | ICD-10-CM

## 2021-05-07 DIAGNOSIS — M2011 Hallux valgus (acquired), right foot: Secondary | ICD-10-CM

## 2021-06-07 IMAGING — MR MRI KNEE RT W/O CONTRAST
4 of 5 series · 21 of 40 positions shown · IV contrast (gadolinium)
Comparison: Radiographs from outside facility dated 04/01/2018.

﻿EXAM:  23217   MRI KNEE RT W/O CONTRAST
INDICATION: Chronic pain.
TECHNIQUE: Multiplanar multisequential MRI of the right knee joint was performed without gadolinium contrast.

[Series 6: PD fat-sat · sagittal · right · 3.0mm · 0.29mm/px · 8 of 30 slices shown (1 of 2)]
[im 1/30]
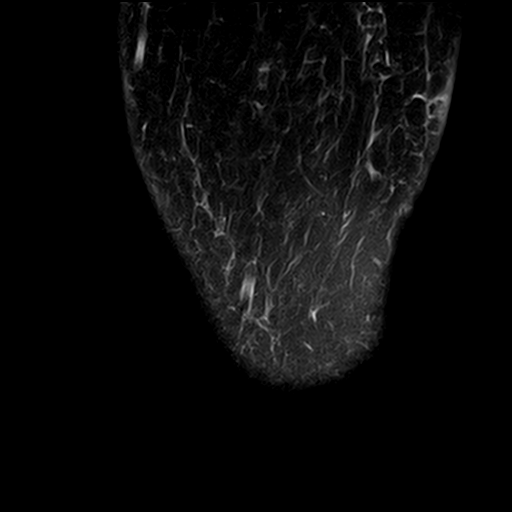
[im 5/30]
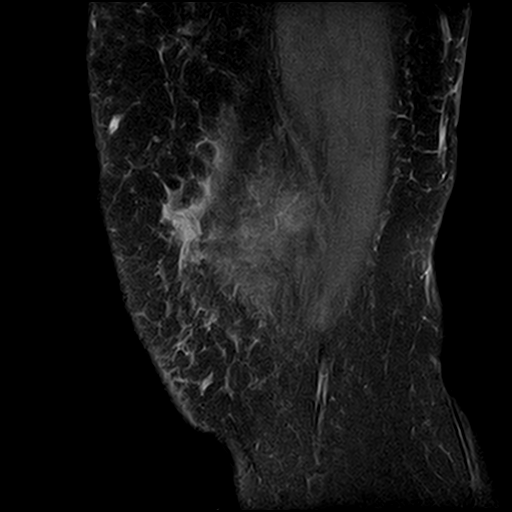
[im 9/30]
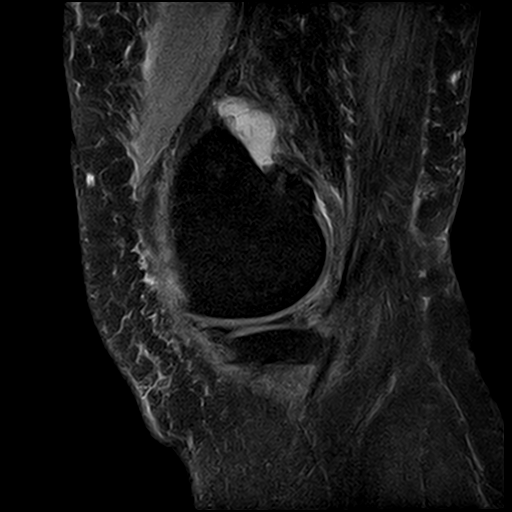
[im 13/30]
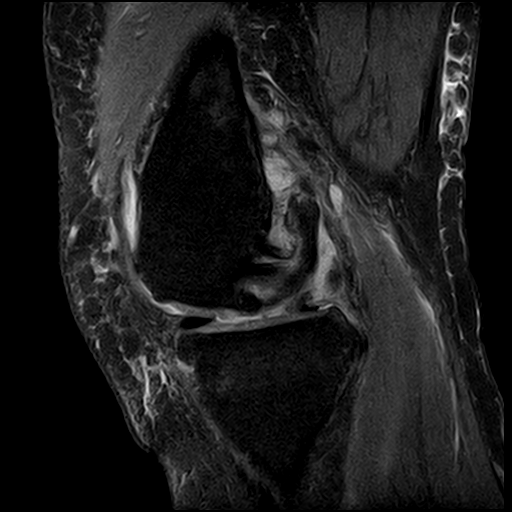
[im 17/30]
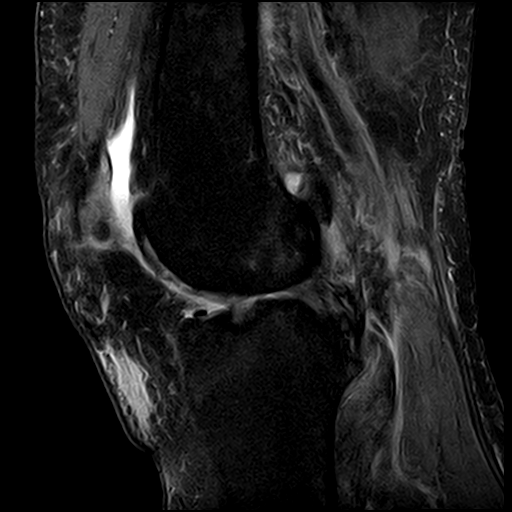
[im 21/30]
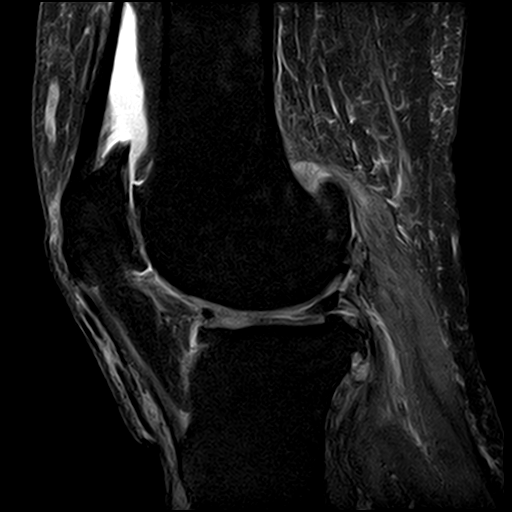
[im 25/30]
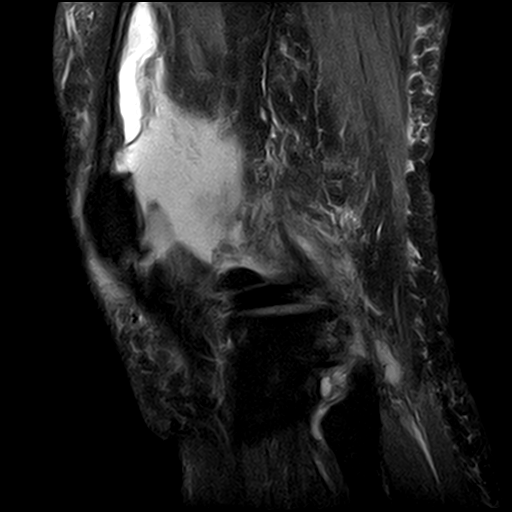
[im 30/30]
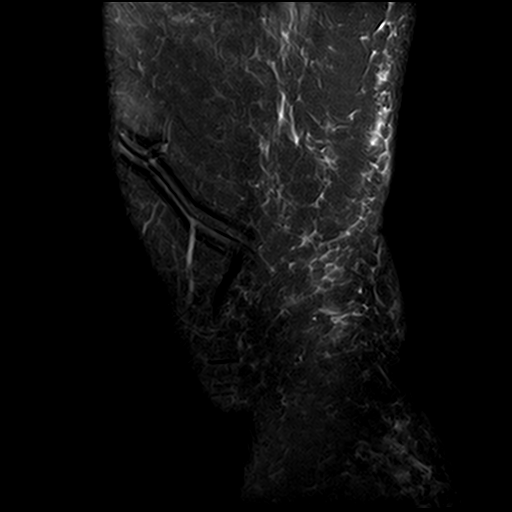

[Series 7: T1 · sagittal · right · 3.0mm · 0.29mm/px · 3 of 30 slices shown]
[im 5/30]
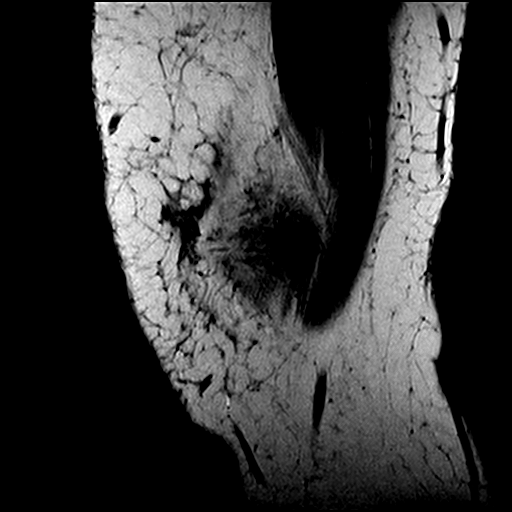
[im 17/30]
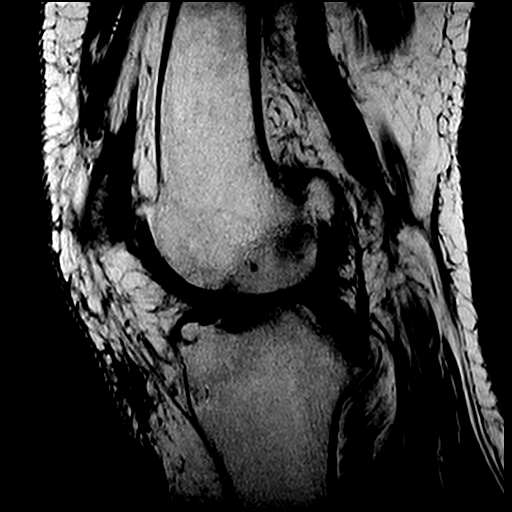
[im 25/30]
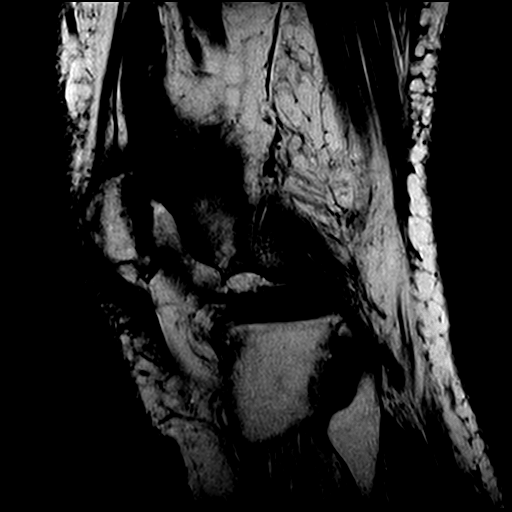

[Series 9: PD fat-sat · coronal · right · 3.0mm · 0.33mm/px · 7 of 27 slices shown (2 of 2)]
[im 1/27]
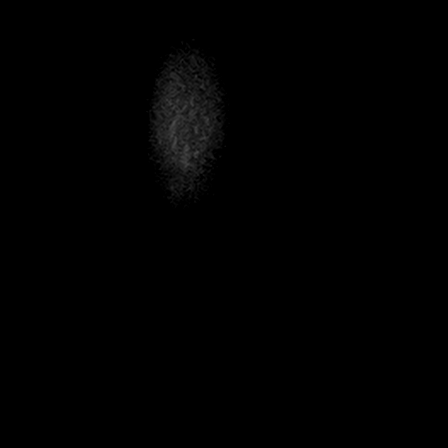
[im 4/27]
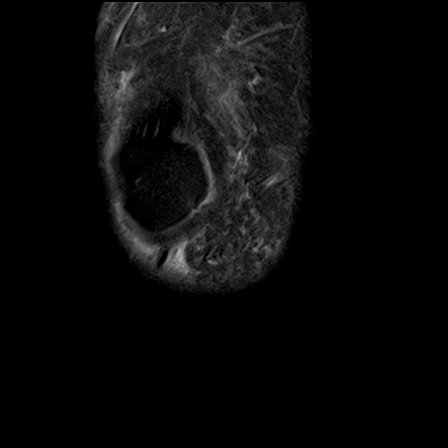
[im 8/27]
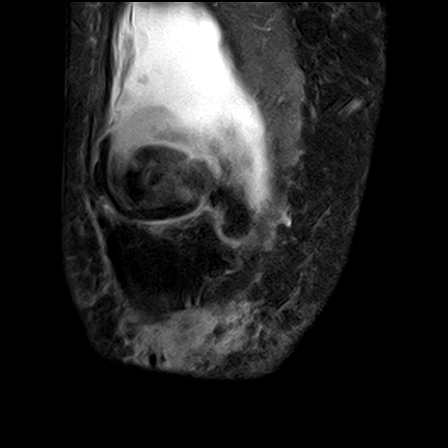
[im 12/27]
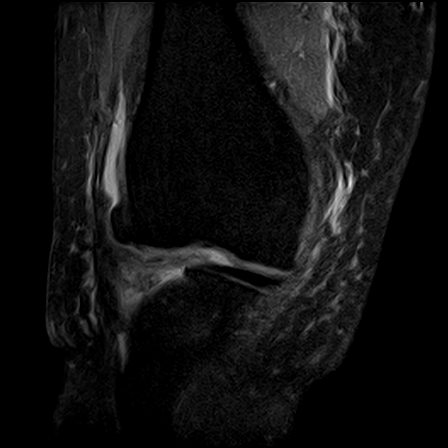
[im 15/27]
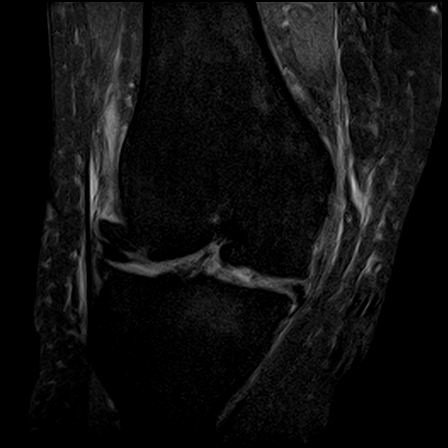
[im 19/27]
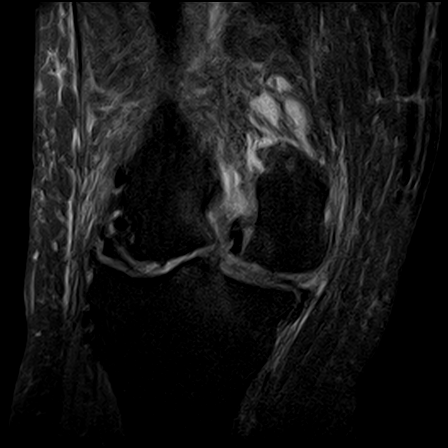
[im 23/27]
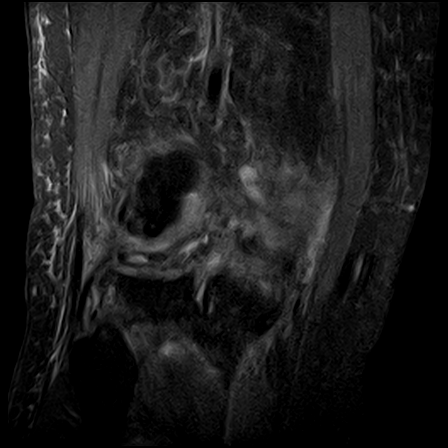

[Series 10: PD · axial · right · 4.0mm · 0.37mm/px · z∈[-74,+16]mm · 3 of 30 slices shown]
[im 5/30]
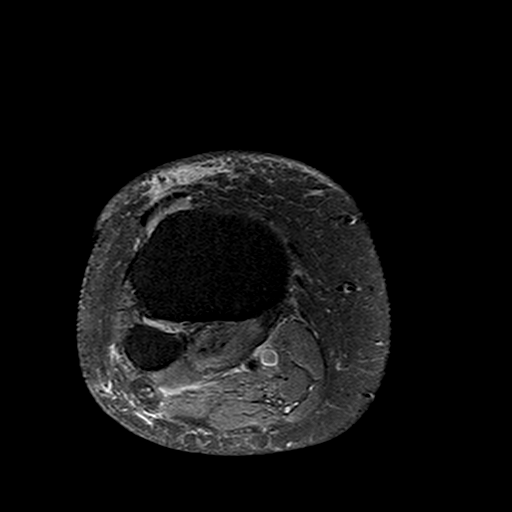
[im 17/30]
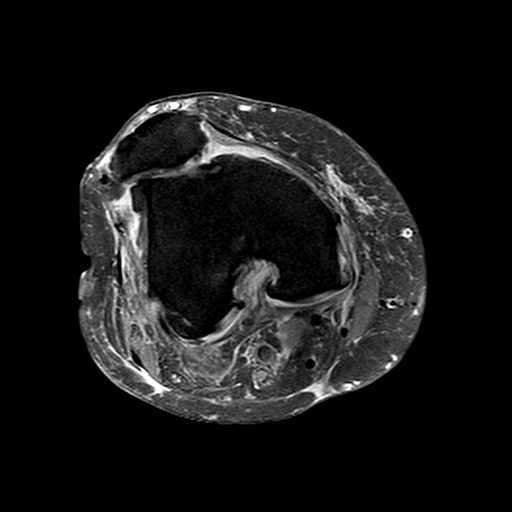
[im 25/30]
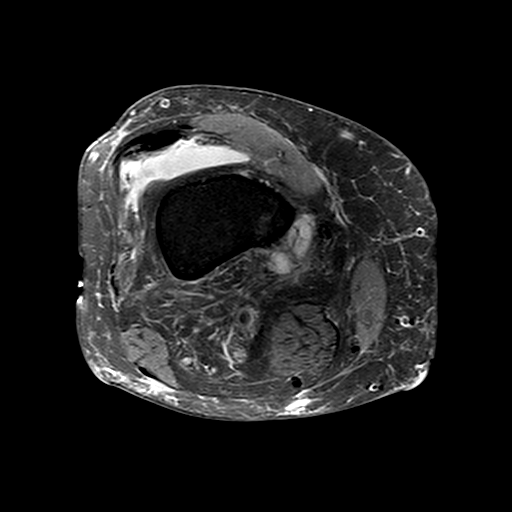

[21 of 40 positions shown; findings below may reference images not displayed]

FINDINGS: Examination is somewhat degraded due to excessive patient motion. There is a complex tear involving the body and posterior horn of the medial meniscus. There is also suggestion of a complex tear involving the body and posterior horn of the lateral meniscus. Cruciate and collateral ligaments appear intact. There is grade 3 chondromalacia of the tibiofemoral articulations and grade 4 chondromalacia patella. Extensor mechanism is intact. Capsular attachments appear unremarkable. Bone marrow signal intensity is normal. There is a moderate suprapatellar effusion. There is no Baker's cyst.
IMPRESSION: 1. Somewhat degraded exam due to excessive patient motion. Tears involving the lateral and medial meniscus as detailed above. 

2. Grade 3 chondromalacia of the tibiofemoral articulations and grade 4 chondromalacia patella.

## 2021-06-11 ENCOUNTER — Encounter (INDEPENDENT_AMBULATORY_CARE_PROVIDER_SITE_OTHER): Payer: Self-pay | Admitting: Podiatrist

## 2021-06-17 ENCOUNTER — Encounter (INDEPENDENT_AMBULATORY_CARE_PROVIDER_SITE_OTHER): Payer: Self-pay | Admitting: Podiatrist

## 2021-06-25 ENCOUNTER — Encounter (INDEPENDENT_AMBULATORY_CARE_PROVIDER_SITE_OTHER): Payer: Self-pay | Admitting: Podiatrist

## 2021-07-03 ENCOUNTER — Telehealth (INDEPENDENT_AMBULATORY_CARE_PROVIDER_SITE_OTHER): Payer: Self-pay | Admitting: Physician Assistant

## 2021-07-03 ENCOUNTER — Other Ambulatory Visit (INDEPENDENT_AMBULATORY_CARE_PROVIDER_SITE_OTHER): Payer: Self-pay | Admitting: Physician Assistant

## 2021-07-03 DIAGNOSIS — M4802 Spinal stenosis, cervical region: Secondary | ICD-10-CM

## 2021-07-03 NOTE — Telephone Encounter (Signed)
Called patient in regards to MRI prior to seeing Dr. Shea Evans. The patient does report her hand dexterity and balance issues are getting worse. Since this is the case will updated her MRI before we see her back.     Unice Vantassel Courtwright, PA-C  07/03/2021, 08:57

## 2021-07-26 ENCOUNTER — Encounter (INDEPENDENT_AMBULATORY_CARE_PROVIDER_SITE_OTHER): Payer: Self-pay | Admitting: Podiatrist

## 2021-07-26 ENCOUNTER — Other Ambulatory Visit: Payer: Self-pay

## 2021-07-26 ENCOUNTER — Ambulatory Visit: Payer: 59 | Attending: Podiatrist | Admitting: Podiatrist

## 2021-07-26 DIAGNOSIS — M205X2 Other deformities of toe(s) (acquired), left foot: Secondary | ICD-10-CM | POA: Insufficient documentation

## 2021-07-26 DIAGNOSIS — E119 Type 2 diabetes mellitus without complications: Secondary | ICD-10-CM | POA: Insufficient documentation

## 2021-07-26 DIAGNOSIS — Z8739 Personal history of other diseases of the musculoskeletal system and connective tissue: Secondary | ICD-10-CM | POA: Insufficient documentation

## 2021-07-26 DIAGNOSIS — L97519 Non-pressure chronic ulcer of other part of right foot with unspecified severity: Secondary | ICD-10-CM | POA: Insufficient documentation

## 2021-07-26 DIAGNOSIS — E11621 Type 2 diabetes mellitus with foot ulcer: Secondary | ICD-10-CM | POA: Insufficient documentation

## 2021-07-26 DIAGNOSIS — L84 Corns and callosities: Secondary | ICD-10-CM | POA: Insufficient documentation

## 2021-07-26 DIAGNOSIS — M79671 Pain in right foot: Secondary | ICD-10-CM | POA: Insufficient documentation

## 2021-07-26 DIAGNOSIS — M79672 Pain in left foot: Secondary | ICD-10-CM | POA: Insufficient documentation

## 2021-07-26 DIAGNOSIS — M2040 Other hammer toe(s) (acquired), unspecified foot: Secondary | ICD-10-CM | POA: Insufficient documentation

## 2021-07-26 NOTE — Nursing Note (Signed)
[facility]  PHYSICIAN OFFICE [place], [place] OFF[facility] C[facility]  [address]  [place] [address]  [phone]  [phone]    Patient Name: Courtney [name]  MRN: [id number]  Date: 07/26/2021    Orthopaedic Faculty: Victorino Sparrow, DPM    Modified off loading pad was applied to the right  LE. Instructions were given on the application and care of the .  Instructions were given on proper skin care, signs and symptoms of skin problems and/or infection.  The patient was instructed to call the Orthopaedic Department with any questions or problems.  Emergency numbers were also given.  The patient verbalized understanding of the above.    Person applying the device: Lonna Duval, Ambulatory Care Assistant 07/26/2021, 15:26

## 2021-07-26 NOTE — Progress Notes (Signed)
[place]  [place], [place] [address]    PATIENT NAME:  Courtney [name]  DATE OF BIRTH:  [birth year]  MRN:  [id number]  DATE OF SERVICE:  07/26/2021     HISTORY OF PRESENT ILLNESS:   56 y.o. Non-Hispanic female presents for evaluation of right foot sub 1st met head ulcer.  Last visit 05/01/2021.  Since last visit, patient states she was recently diagnosed with diabetes type 2 2.5 weeks ago and has started [place].  Patient recently developed worsening ulceration to right foot sub 1st met head ulcer with pain.  Has completed 2 rounds of oral antibiotic and feels that the area has greatly improved.  States also had wound culture which was positive for staph.  Recently submitted surgical case request for right foot hallux IPJ fusion, digital arthroplasty toes 2-5, partial sesamoidectomy (exostectomy).  Had previous surgical case submitted for for L foot 2nd digit arthroplasty and capsulorrhaphy with temporary K wire fixation.  Patient now rates her right foot much more painful than left.  Pain is currently 0/10 but is much worse with direct pressure. 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  Past surgical hx also includes lesser digit hammertoe repair to toes 2 -4 in the 1990's. 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  She is a former smoker.  She presents to clinic today with her spouse.     REVIEW OF SYSTEMS:   Denies fevers, chills, nausea, vomiting, and shortness of breath.  All other systems reviewed and are negative, except as reported in HPI.      PHYSICAL EXAM:   General:  Appears stated age.  Well-appearing.    Psychiatric:  Pleasant.  Cooperative.  Alert and oriented x 3.    Respiratory:  Lung expansion symmetric.  No active  work of breathing.    Heme/Lymphatic:  No lymphadenopathy.    ENMT:  Sclera clear.  Trachea midline.  Mucous membranes moist.    Vascular:  +2/4 dorsalis pedis and posterior tibial pulses, bilaterally.  Capillary fill time was less than 3 seconds, bilaterally.  Skin temperature was warm at the level of the toes, bilaterally.  There was no edema noted.   Intact hair growth noted to bilateral tibia.  Skin was of good turgor and texture.    Neurologic:  Gross sensation intact to touch, bilaterally.  No paresthesias noted, bilateral feet. No numbness noted.  Dermatologic: Right foot sub 1st met head with hypertrophic callus with central discoloration measuring approximately 1.5 mm circular s/p sharp debridement some subcutaneous soft tissue discoloration noted without obvious skin breakdown, positive tender with palpation.  No open wounds or other lesions were noted.  No ecchymosis or erythema noted, bilateral feet.  No areas of hyperpigmentation.  Nails are normotrophic, bilaterally.  No maceration noted to interspaces.    Musculoskeletal: Right foot hallux hammertoe with slight abduction and partially reducible hammertoe deformities 2-5, 5th digit also noted to be slightly adducted.  Left foot hammertoe deformities to left 3 lesser toes; partially reducible. Left 2nd digit noted overlapping 3rd digit and in abducted position.   +5/5 dorsiflexion, plantarflexion, inversion, eversion, intrinsic muscle strength, bilaterally.  Full range of motion, all quadrants, bilaterally.  No pain with range of motion.  No crepitus noted.          ASSESSMENT:      1. Right foot sub 1st met pre ulcerative callus with pain likely 2/2 to hallux hammertoe and hypertrophic sesamoids.  Ulceration and infection improved since patient presented to ED  2.  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  Right 5th digit adducted hammertoe deformity  4. Left foot 2nd digit hammertoe abducted and overlapping 3rd digit with  pain; failed conservative treatment.   5. DM 2 (HgbA1c 5.7 12/29/2018)    PLAN:    - Discussed clinical exam and answered all questions.    - Discussed risks, benefits and alternatives to sharp callus debridement.  Verbal consent for debridement given.  - First metatarsal head offloading band dispensed.  Advised to continue aggressive offloading around area of pre ulcerative callus  - Discussed a surgical plan for right foot:  Right foot hallux IPJ fusion, digits 2-5 arthroplasty with temporary K-wire fixation 2-4, partial sesamoidectomy (exostectomy)  -  No guarantees were given or implied regarding surgical outcome  - RTC for pre-op evaluation.      Procedure:   Hhypertrophic callus with measurements noted above sharply debrided with 10 blade.  Level of debridement was superficial.  Patient tolerated well.    This note may have been partially generated using MModal Fluency Direct system and there may be some incorrect words, spellings, and punctuation that were not noted in checking the note before saving.    On the day of the encounter, a total of  20 minutes was spent on this patient encounter including review of historical information, examination, documentation and post-visit activities.     Curt Bears Aleene Davidson  Podiatry, Assistant Professor  Department of [facility]  Pager [phone]

## 2021-08-05 ENCOUNTER — Ambulatory Visit (INDEPENDENT_AMBULATORY_CARE_PROVIDER_SITE_OTHER): Payer: Self-pay | Admitting: Orthopaedic Surgery of the Spine

## 2021-08-06 ENCOUNTER — Encounter (INDEPENDENT_AMBULATORY_CARE_PROVIDER_SITE_OTHER): Payer: Self-pay

## 2021-08-09 ENCOUNTER — Telehealth (INDEPENDENT_AMBULATORY_CARE_PROVIDER_SITE_OTHER): Payer: Self-pay | Admitting: Physician Assistant

## 2021-08-09 DIAGNOSIS — F419 Anxiety disorder, unspecified: Secondary | ICD-10-CM

## 2021-08-09 MED ORDER — DIAZEPAM 5 MG TABLET
5.00 mg | ORAL_TABLET | Freq: Two times a day (BID) | ORAL | 0 refills | Status: AC
Start: 2021-08-09 — End: 2021-08-10

## 2021-08-09 NOTE — Telephone Encounter (Signed)
Called the patient in regards to MRI. She reports that she had a panic attack before her most recent MRI. She is going to reschedule an MRI and is asking for some valium. Will fill this for her.     Zong Mcquarrie Courtwright, PA-C  08/09/2021, 15:31

## 2021-08-13 ENCOUNTER — Ambulatory Visit (HOSPITAL_BASED_OUTPATIENT_CLINIC_OR_DEPARTMENT_OTHER): Payer: Self-pay | Admitting: Orthopaedic Surgery

## 2021-08-15 ENCOUNTER — Ambulatory Visit (HOSPITAL_BASED_OUTPATIENT_CLINIC_OR_DEPARTMENT_OTHER): Payer: Self-pay | Admitting: Orthopaedic Surgery

## 2021-08-26 ENCOUNTER — Ambulatory Visit (INDEPENDENT_AMBULATORY_CARE_PROVIDER_SITE_OTHER): Payer: Self-pay | Admitting: Orthopaedic Surgery of the Spine

## 2021-08-29 ENCOUNTER — Encounter (HOSPITAL_BASED_OUTPATIENT_CLINIC_OR_DEPARTMENT_OTHER): Payer: Self-pay | Admitting: Orthopaedic Surgery

## 2021-08-29 ENCOUNTER — Ambulatory Visit: Payer: 59 | Attending: Podiatrist | Admitting: Podiatrist

## 2021-08-29 ENCOUNTER — Encounter (INDEPENDENT_AMBULATORY_CARE_PROVIDER_SITE_OTHER): Payer: Self-pay | Admitting: Podiatrist

## 2021-08-29 ENCOUNTER — Ambulatory Visit (HOSPITAL_BASED_OUTPATIENT_CLINIC_OR_DEPARTMENT_OTHER): Payer: 59 | Admitting: Orthopaedic Surgery

## 2021-08-29 ENCOUNTER — Ambulatory Visit (HOSPITAL_COMMUNITY)
Admission: RE | Admit: 2021-08-29 | Discharge: 2021-08-29 | Disposition: A | Discharge status: Home / Self Care | Payer: 59 | Source: Ambulatory Visit | Attending: Podiatrist | Admitting: Podiatrist

## 2021-08-29 ENCOUNTER — Inpatient Hospital Stay (HOSPITAL_BASED_OUTPATIENT_CLINIC_OR_DEPARTMENT_OTHER)
Admission: RE | Admit: 2021-08-29 | Discharge: 2021-08-29 | Disposition: A | Discharge status: Home / Self Care | Payer: 59 | Source: Ambulatory Visit | Admitting: Radiology

## 2021-08-29 ENCOUNTER — Other Ambulatory Visit: Payer: Self-pay

## 2021-08-29 ENCOUNTER — Encounter (HOSPITAL_COMMUNITY): Payer: Self-pay | Admitting: Podiatrist

## 2021-08-29 ENCOUNTER — Inpatient Hospital Stay (HOSPITAL_COMMUNITY)
Admission: RE | Admit: 2021-08-29 | Discharge: 2021-08-29 | Disposition: A | Discharge status: Home / Self Care | Payer: 59 | Source: Ambulatory Visit

## 2021-08-29 ENCOUNTER — Encounter (HOSPITAL_COMMUNITY): Payer: Self-pay

## 2021-08-29 VITALS — BP 129/58 | HR 76 | Temp 97.0°F | Ht 64.0 in | Wt 197.5 lb

## 2021-08-29 VITALS — BP 129/58 | HR 76 | Temp 97.0°F | Ht 64.02 in | Wt 197.5 lb

## 2021-08-29 DIAGNOSIS — M1711 Unilateral primary osteoarthritis, right knee: Secondary | ICD-10-CM

## 2021-08-29 DIAGNOSIS — M79674 Pain in right toe(s): Secondary | ICD-10-CM | POA: Insufficient documentation

## 2021-08-29 DIAGNOSIS — L84 Corns and callosities: Secondary | ICD-10-CM | POA: Insufficient documentation

## 2021-08-29 DIAGNOSIS — M25561 Pain in right knee: Secondary | ICD-10-CM

## 2021-08-29 DIAGNOSIS — M2040 Other hammer toe(s) (acquired), unspecified foot: Secondary | ICD-10-CM | POA: Insufficient documentation

## 2021-08-29 DIAGNOSIS — E119 Type 2 diabetes mellitus without complications: Secondary | ICD-10-CM | POA: Insufficient documentation

## 2021-08-29 DIAGNOSIS — M79672 Pain in left foot: Secondary | ICD-10-CM | POA: Insufficient documentation

## 2021-08-29 DIAGNOSIS — Z8739 Personal history of other diseases of the musculoskeletal system and connective tissue: Secondary | ICD-10-CM | POA: Insufficient documentation

## 2021-08-29 DIAGNOSIS — Z01818 Encounter for other preprocedural examination: Secondary | ICD-10-CM

## 2021-08-29 DIAGNOSIS — M205X2 Other deformities of toe(s) (acquired), left foot: Secondary | ICD-10-CM | POA: Insufficient documentation

## 2021-08-29 DIAGNOSIS — M79671 Pain in right foot: Secondary | ICD-10-CM | POA: Insufficient documentation

## 2021-08-29 HISTORY — DX: Liver disease, unspecified: K76.9

## 2021-08-29 HISTORY — DX: Polyneuropathy, unspecified: G62.9

## 2021-08-29 HISTORY — DX: Nausea with vomiting, unspecified: Z98.890

## 2021-08-29 HISTORY — DX: Constipation, unspecified: K59.00

## 2021-08-29 HISTORY — DX: Dorsopathy, unspecified: M53.9

## 2021-08-29 HISTORY — DX: Other specified postprocedural states: Z98.890

## 2021-08-29 HISTORY — DX: Type 2 diabetes mellitus without complications: E11.9

## 2021-08-29 HISTORY — DX: Other specified postprocedural states: R11.2

## 2021-08-29 HISTORY — DX: Nausea with vomiting, unspecified: R11.2

## 2021-08-29 LAB — POC BLOOD GLUCOSE (RESULTS): GLUCOSE, POC: 103 mg/dl (ref 70–105)

## 2021-08-29 NOTE — Anesthesia Preprocedure Evaluation (Addendum)
ANESTHESIA PRE-OP EVALUATION  Courtney [name]  Planned Procedure: ARTHROPLASTY HAMMERTOE CORRECTION (Right: Foot)  ARTHRODESIS FUSION METATARSOPHALANGEAL JOINT (Right: Foot)  PARTIAL SESAMOIDECTOMY FOOT (Right)  Review of Systems    PONV                 Pulmonary   asthma, shortness of breath, sleep apnea (untreated ) and past history of smoking ,   Cardiovascular    Hypertension, murmur, Chest Pain , PVD, ACE / ARB inhibitor use and hyperlipidemia ,No peripheral edema,  Exercise Tolerance: <4 METS        GI/Hepatic/Renal    GERD and liver disease (Fatty Liver)        Endo/Other    hypothyroidism and anemia,   type 2 diabetes (Hgba1c:6.5 06/2021)    Neuro/Psych/MS    headaches, back abnormality, fibromyalgia, depression    peripheral neuropathy,  Cancer                Physical Assessment      Airway       Mallampati: III    TM distance: >3 FB    Neck ROM: full  Mouth Opening: good.      No endotracheal tube present  No Tracheostomy present    Dental       Dentition intact             Pulmonary    Breath sounds clear to auscultation  (-) no rhonchi, no decreased breath sounds, no wheezes, no rales and no stridor     Cardiovascular    Rhythm: regular  Rate: Normal  (-) no friction rub, carotid bruit is not present, no peripheral edema and no murmur     Other findings            Plan  ASA 3     Planned anesthesia type: MAC                   PONV/POV Plan:  I plan to administer pharmcologic prophalaxis antiemetics  Intravenous induction     Anesthesia issues/risks discussed are: Dental Injuries, Nerve Injuries, PONV, Cardiac Events/MI, Blood Loss, Aspiration, Post-op Intubation/Ventilation, Post-op Pain Management, Stroke, Sore Throat, Intraoperative Awareness/ Recall, Eye /Visual Loss and Difficult Airway.  Anesthetic plan and risks discussed with patient  Signed consent obtained        Use of blood products discussed with patient who consented to blood products.     Patient's NPO status is appropriate for  Anesthesia.           Plan discussed with attending.    (MAC with GETA backup)            ECG 08/14/2021 (scanned in media) - sinus rhythm, HR: 68    EKG 09/25/2021 (scanned into media) - sinus rhythm, left atrial enlargement, abnormal R-wave progression, late transition      Labs - 08/16/2021 (scanned in media)-CBC and CMP- elevated AST and ALT otherwise within normal limits   AST-62(H), ALT 64 (H),T. Bili- 0.3    Hgba1c (in media)- 6.5 07/03/2021    Labs 11/15/21 (scanned into media on 11/19/21): creatinine 0.6, Na 139, K 3.8, Mag 2.0            Pt reports unchanged chest pain for several years. States she has had 4 surgeries in the last 7 years with unchanged symptoms. She reports chest pain is as recent as last week. Mid sternal. Radiation to the neck and left arm is infrequent. Can not  attribute pain to anything in particular. Last from 2-15 minutes. She denies associated diaphoresis, shortness of breath, syncope. She report Cardiology work up 2015 or 2016 by Dr. Mancel Bale and stress test was negative. She states she did see Cardiologist in Nov who called her PCP to diagnose her with "something" and she states she denies having that diagnosis and PCP made her aware that surgery would not be approved with that diagnosis. She can not recall the name of the Cardiologist. She does not desire to go back to the Cardiologist who evaluated her. Release of information obtained for previous cardiac notes and testing. She did have a work up with her PCP and progress note and testing requested. Message was sent to Dr. Federico Flake for anesthesia recommendations and recommends review of cardiac records before final decision is made. Dr. Marguarite Arbour made aware via in-basket.       Cardiology Note scanned in media - 09/2014 and 03/2021     - Cardiac evaluation for shortness of breath and chest pain 09/2014-Negative echo and stress test 09/13/2014    - Echo 12/02/2018: EF 55-60%,Nomral LV size and function, no wall motion abnormalities. Abnormal  left ventricular diastolic dysfunction. RV size and systolic function are within normal limits.  No valvular stenosis or regurgitation    - Cardiac evaluation 03/2021 stating CAD and atrial fibrillation and pt denies hx of this. Note includes echo results normal left ventricle ejection fraction 65%. RV normal and there was mild MR and AR     H&P (PCP) scanned in media: Low risk for perioperative cardiopulmonary complications. Labs pending. Treace LE and blood on UA, culture pending, asymptomatic. EKG normal. Plan to proceed w/ surgery pending normal labs/urine culture     Addendum 09/04/2021: Pt has poor functional status, chest pain, and shortness of breath last MPS in 2016. Given her risk factors and symptoms. This provider has recommended MPS prior to proceeding to rule out cardiac ischemia. Surgeon has been notified via in-basket. Pt made aware of recommendations and states she will set this up with her PCP.     Addendum 09/25/2021: Per Valentina Lucks patient was contacted via telephone and she states she has a Cardiology appointment today and she has decided to return to the Cardiologist that evaluated her 03/2021. Will request notes and recommendations after the appointment is completed.     Addendum 10/07/2021:Cardiology note from 09/25/2021 stating pt is low risk for foot surgery from cardiac standpoint and no indication for pre-surgery STT for the plan toe/ surgery.               Instructed patient to hold:  - vitamins, supplements, and NSAIDs for 7 days prior to surgery.  - potassium, magnesium, losartan, lasix, iron the morning of surgery.    Instructed to take the following medications the morning of surgery: levothyroxine, Claritin, Protonix.         Patient provided anesthesia consent in PAT. Instructed to review consent prior to OR date. Educated that consent will be signed morning of surgery with anesthesiologist.

## 2021-08-29 NOTE — H&P (Signed)
[facility]  Pre-Operative H&P    Courtney [name], 56 y.o. female  Date of Birth:  [birth year]  Date of service: 08/29/2021    Information Obtained from: patient  Chief Complaint: R foot hallux, 2-5 digit hammertoe deformity with hypertrophic sesamoids.    PCP: Courtney Levels, MD       HPI: (must include no less than 4 of the following main descriptors) Location (of pain): Quality (character of pain) Severity (minimal, mild, severe, scale or 1-10) Duration (how long has pain/sx present) Timing (when does pain/sx occur)  Context (activity at/before onset) Modifying Factors (what makes pain/sx  Better/worse) Associate Sign/Sx (what accompanies main pain/sx)     Courtney [name] is a 56 y.o., White female who presents with right foot sub 1st met head ulcer 2/2 to hallux hammertoe deformity and hypertrophic sesamoids.  Last visit 07/26/2021. Patient states she was recently diagnosed with diabetes type 2 2.5 weeks ago and has started [place].  Patient recently developed worsening ulceration to right foot sub 1st met head ulcer with pain.  Has completed 2 rounds of oral antibiotic and feels that the area has greatly improved.  States also had wound culture which was positive for staph.  Recently submitted surgical case request for right foot hallux IPJ fusion, digital arthroplasty toes 2-5, partial sesamoidectomy (exostectomy).  Had previous surgical case submitted for for L foot 2nd digit arthroplasty and capsulorrhaphy with temporary K wire fixation.  Patient now rates her right foot much more painful than left.  Pain is currently 0/10 but is much worse with direct pressure. 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  Past surgical hx also includes lesser digit hammertoe repair to toes 2 -4 in the 1990's. 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  She is a former smoker.  She presents to clinic today with her spouse.       Pre-operative Risk Assessment   Pre-operative assessment not obtained R/T .  (Dr Owens Shark, 08/15/21)    ROS:  MUST comment on all "Abnormal" findings   ROS Other than ROS in the HPI, all other systems were negative.    PAST MEDICAL/ FAMILY/ SOCIAL HISTORY:       Past Medical History:   Diagnosis Date   . Anemia 12/29/2018    iron Deficiency    . Arthritis    . Asthma 12/29/2018    childhood, no inhalers since   . Depression 12/29/2018    Denies SI/HI    . Eczema 12/29/2018    hands x 1 year- eczema- seen derm   . Esophageal reflux 12/29/2018    controlled   . Fibromyalgia    . Gout    . Headache    . Heart murmur 12/29/2018    Reports she was told this in her 82s   . Hx of transfusion 12/29/2018    denies rx   . Hyperlipidemia    . Hypertension    . Hypothyroid    . Nausea with vomiting    . Peripheral edema 12/29/2018    chronic left lower extremity swelling    . Peripheral vascular disease (CMS [place])     left lower extremity    . Pre-diabetes    . Problems with swallowing    . Shortness of breath 12/29/2018    chronic  unchanged shortness of breath   . Sleep apnea 12/29/2018    doesn't wear CPAP    . Thyroid disease    . Wears glasses          Allergies   Allergen Reactions   . Macrodantin [Nitrofurantoin] Shortness of Breath   . Macrobid [Nitrofurantoin Monohyd/M-Cryst]    . Morphine Swelling   . Zofran [Ondansetron] Nausea/ Vomiting      Cannot display prior to admission medications because the patient has not been admitted in this contact.         No current facility-administered medications for this visit.    Past Surgical History:   Procedure Laterality Date   . ANKLE SURGERY Right    . BLADDER SURGERY     . CESAREAN SECTION      x 3   . COLONOSCOPY     . HX HIP REPLACEMENT Left    . HX UPPER ENDOSCOPY     . HYSTEROTOMY     . LAMINECTOMY      fusion, x2   . LEG SURGERY Left     x 3 , skin graft - from dog bite          Family Medical History:    None         Social History     Tobacco Use   . Smoking status: Light Smoker     Packs/day: 1.00     Years: 25.00     Pack years: 25.00     Types: Cigarettes   . Smokeless tobacco: Never   . Tobacco comments:     States a few cigarettes over the last few weeks    Vaping Use   . Vaping Use: Never used   Substance Use Topics   . Alcohol use: Not Currently     Comment: twice/month   . Drug use: Not Currently         PHYSICAL EXAMINATION: MUST comment on all "Abnormal" findings    Exam  BP (!) 129/58   Pulse 76   Temp 36.1 C (97 F)   Ht 1.626 m (_0 )   Wt 89.6 kg (197 lb 8.5 oz)   BMI 33.91 kg/m       General: appears in good health  Eyes: Conjunctiva clear.  HENT:ENMT without erythema or injection, mucous membranes moist.  Neck: no thyromegaly or lymphadenopathy  Lungs: Clear to auscultation bilaterally.   Cardiovascular: regular rate and rhythm  Abdomen: Soft, non-tender  Extremities: Right foot hallux hammertoe with slight abduction and partially reducible hammertoe deformities 2-5, 5th digit also noted to be slightly adducted.  Left foot hammertoe deformities to left 3 lesser toes; partially reducible. Left 2nd digit noted overlapping 3rd digit and in abducted position. No cyanosis or edema  Skin: Right foot sub 1st met head with hypertrophic callus with central discoloration measuring approximately 1.5 mm circular s/p sharp debridement some subcutaneous soft tissue discoloration noted without obvious skin breakdown, positive tender with palpation. Skin warm and dry  Neurologic: Grossly normal  Lymphatics: No lymphadenopathy  Psychiatric: Normal      IMPRESSION:    1. Right foot sub 1st met pre ulcerative callus with pain likely 2/2 to hallux hammertoe and hypertrophic sesamoids.  Ulceration and infection improved since patient presented to ED  2.  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  Right 5th digit adducted hammertoe deformity  4. Left  foot 2nd digit hammertoe abducted and  overlapping 3rd digit with pain; failed conservative treatment.   5. DM 2 (HgbA1c 5.7 12/29/2018)    Recommendations       Plan for Right foot hallux IPJ fusion, digits 2-5 arthroplasty with temporary K-wire fixation 2-4, partial sesamoidectomy (exostectomy).    - MAC anesthesia with preoperative popliteal-adductor anesthetic block.  - CAM boot  - Bone stimulator to be dispensed day of surgery  - Percocet 5/367m PO q 6hrs x 5 days    Opioid Prescription - First Prescription  Diagnosis requiring prescription: post operative pain control for above planned procedure    Medication Dosage/Frequency being prescribed: Percocet 5/325 mg PO Q 6 hours x 5 days    I have reviewed prior medication history in the medical record for this patient.  I have also reviewed information contained in the state controlled prescription drug monitoring database.    I have discussed any history of non-pharmacological treatment with the patient.  The patient reports no prior treatment history.      The patient denies history of substance abuse treatment.      Physical exam findings and/or clinical history warranting use of opioid treatment include: post operative pain control for above planned procedure    My goals for treatment include: Preoperative popliteal/adductor anesthetic block and lowest effective dose 5/325 mg    I have discussed the risk of opioid addiction with the patient.  I have also discussed the risk of using sedatives and alcohol while taking opioids.            DVT/PE Prophylaxis: Not indicated, low risk for VTE    Patient has decision making capacity:  yes  Advance Directive:  No Advance Directive  DNR Status prior to admission:  Full Code  DNR Status this admission:  Full Code    Disposition Planning: Home discharge       I am scribing for, and in the presence of, KBerniece Pap DPM, for services provided on 08/29/2021.  JKyra Leyland SCRIBE      I personally performed the services  described in this documentation, as scribed  in my presence, and it is both accurate  and complete.    KBerniece Pap DPM    KBerniece Pap DPM  Asst Professor - Dept of Orthopaedics  Podiatry  Pager [phone]

## 2021-08-29 NOTE — H&P (Signed)
[facility]    Chief Complaint:   Chief Complaint   Patient presents with   . Knee Pain        HPI:  This is a 56 y.o. year old patient who presents with a Several month history of right knee pain.  Pain is located in the posterior knee.    The pain limits the ability to walk, exercise and work.   She has tried icing.  She rates the pain a 6-7/10 at its very worst.  Pain is worse with standing and walking and relieved with ice and sitting.  Associated symptoms include clicking, popping, swelling and stiffness.    PMH:  Past Medical History:   Diagnosis Date   . Anemia 12/29/2018    iron Deficiency    . Arthritis    . Asthma 12/29/2018    childhood, no inhalers since   . Back problem    . Constipation    . Depression 12/29/2018    Denies SI/HI    . Disorder of liver     Fatty Liver   . Eczema 12/29/2018    hands x 1 year- eczema- seen derm   . Esophageal reflux 12/29/2018    controlled   . Fibromyalgia    . Headache    . Heart murmur 12/29/2018    Reports she was told this in her 48s   . Hx of transfusion 12/29/2018    denies rx   . Hyperlipidemia    . Hypertension    . Hypothyroid    . Nausea with vomiting    . Peripheral edema 12/29/2018    chronic left lower extremity swelling    . Peripheral neuropathy    . Peripheral vascular disease (CMS [place])     left lower extremity    . PONV (postoperative nausea and vomiting)    . Pre-diabetes    . Problems with swallowing    . Shortness of breath 12/29/2018    DOE- several years   . Sleep apnea 12/29/2018    doesn't wear CPAP    . Thyroid disease    . Type 2 diabetes mellitus (CMS HCC)     Diagnosed 06/2021, Hgba1c- ?   Marland Kitchen Wears glasses          PSH:  Past Surgical History:   Procedure Laterality Date   . ANKLE SURGERY Right    . BLADDER SURGERY     . CESAREAN SECTION      x 3   . COLONOSCOPY     . HX CARPAL TUNNEL RELEASE Bilateral 2020   . HX HIP REPLACEMENT Bilateral     Right infected and revised   . HX UPPER ENDOSCOPY     . HYSTEROTOMY     .  LAMINECTOMY      fusion, x2   . LEG SURGERY Left     x 3 , skin graft - from dog bite         Medications:    Current Outpatient Medications:   .  ascorbic acid, vitamin C, (VITAMIN C) 500 mg Oral Tablet, Take 1 Tablet (500 mg total) by mouth Once a day, Disp: , Rfl:   .  aspirin/acetaminophen/caffeine (EXCEDRIN MIGRAINE ORAL), Take by mouth, Disp: , Rfl:   .  biotin 5 mg/mL Oral Suspension, Take by mouth Once a day, Disp: , Rfl:   .  calcium citrate-vitamin D3 (CITRACAL) 200 mg calcium -250 unit Oral Tablet, Take by mouth Once  a day, Disp: , Rfl:   .  Cholecalciferol, Vitamin D3, 25 mcg (1,000 unit) Oral Capsule, Take by mouth, Disp: , Rfl:   .  cyanocobalamin (VITAMIN B 12) 1,000 mcg Oral Tablet, Take by mouth Once a day , Disp: , Rfl:   .  ferrous sulfate (FERATAB) 324 mg (65 mg iron) Oral Tablet, Delayed Release (E.C.), Take 1 Tablet (324 mg total) by mouth Every other day, Disp: , Rfl:   .  fluocinolone (SYNALAR) 0.025 % Cream, by Apply Topically route Twice daily, Disp: , Rfl:   .  furosemide (LASIX) 20 mg Oral Tablet, furosemide 20 mg tablet  TAKE 1 TABLET BY MOUTH EVERY DAY, Disp: , Rfl:   .  Levothyroxine 75 mcg Oral Capsule, Every one hour, Disp: , Rfl:   .  loratadine (CLARITIN) 10 mg Oral Tablet, Take 1 Tablet (10 mg total) by mouth Every evening, Disp: , Rfl:   .  losartan (COZAAR) 50 mg Oral Tablet, Take 2 Tablets (100 mg total) by mouth Once a day, Disp: , Rfl:   .  magnesium oxide (MAG-OX) 400 mg (241.3 mg magnesium) Oral Tablet, Take 1 Tablet (400 mg total) by mouth Once a day, Disp: , Rfl:   .  MOUNJARO 2.5 mg/0.5 mL Subcutaneous Pen Injector, Fridays (Night), Disp: , Rfl:   .  pantoprazole (PROTONIX) 40 mg Oral Tablet, Delayed Release (E.C.), Take 1 Tablet (40 mg total) by mouth Once per day as needed, Disp: , Rfl:   .  pimecrolimus (ELIDEL) 1 % Cream, APPLY TO AFFECTED AREA TWICE A DAY, Disp: , Rfl:   .  POTASSIUM CHLORIDE ORAL, Take 20 mEq by mouth Every night, Disp: , Rfl:   .  triamcinolone  acetonide (ARISTOCORT A) 0.1 % Cream, triamcinolone acetonide 0.1 % topical cream, Disp: , Rfl:     Allergy:    Allergies   Allergen Reactions   . Macrodantin [Nitrofurantoin] Shortness of Breath   . Macrobid [Nitrofurantoin Monohyd/M-Cryst]    . Morphine Swelling   . 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  Breathing is Normal/Unlabored  Gait:  Stable without assistive device and antalgic  Deformity:  mild valgus  ROM of the right knee:  10 to 110  Tenderness:  anterior and lateral   Neuro:  normal   Vascular:  present 2+  Reflexes:  not tested  Skin:  color normal, vascularity normal and no lesions noted  Significant forefoot deformity    X-rays:  Today's images of the right knee reveal severe bone on bone tricompartmental DJD worse in the lateral and patello-femoral compartment     Impression:      ICD-10-CM    1. Primary osteoarthritis of right knee  M17.11       2. 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 Lilia Pro  She then had her other hip replaced and had an uneventful recovery  She may be higher risk for post op infection given this history and that of diabetes mellitus   She will message me in a few days to let me know her response to the intra-articular injection  I am happy to get the scheduling process going when she is ready  She will follow up prn  She was encouraged to call with questions or concerns.      Jacqualine Code, MD  08/29/2021, 14:03          Cc    Eloise Levels, MD  [address]  [place],  [place] [address]    Eloise Levels, [place]  [place] [address]

## 2021-08-30 ENCOUNTER — Ambulatory Visit (HOSPITAL_COMMUNITY): Payer: Self-pay

## 2021-08-30 ENCOUNTER — Ambulatory Visit (INDEPENDENT_AMBULATORY_CARE_PROVIDER_SITE_OTHER): Payer: Self-pay | Admitting: Podiatrist

## 2021-09-02 ENCOUNTER — Encounter (HOSPITAL_BASED_OUTPATIENT_CLINIC_OR_DEPARTMENT_OTHER): Payer: Self-pay | Admitting: Orthopaedic Surgery

## 2021-09-03 DIAGNOSIS — M1711 Unilateral primary osteoarthritis, right knee: Secondary | ICD-10-CM

## 2021-09-13 ENCOUNTER — Ambulatory Visit (INDEPENDENT_AMBULATORY_CARE_PROVIDER_SITE_OTHER): Payer: Self-pay | Admitting: Podiatrist

## 2021-09-13 NOTE — Procedures (Signed)
The pt was offered a corticosteroid injection and provided verbal consent.  The right knee was injected with 4cc's of 1% lidocaine and 80 mg depomedrol using sterile technique. The patient was advised that the injection site is frequently tender the evening of the injection and that on some occasions, the joint pain is worse after the injection for the first 24 to 48 hours.  The patient was advised to alternate between ice and heat, and to use OTC pain relievers for the pain until it subsides.  The patient was also advised to contact the office if the pain becomes severe and cannot be controlled with the above remedies.  The patient was warned about facial flushing and warmth after the injection and to use Benadryl if this occurs.  Finally the patient was advised that it may take up to 72 hours for the injection to reach peak effectiveness.  The patient was advised to contact this office for follow up if the injection provided no significant pain relief.  Kathee Delton MD  Assistant Professor Orthopaedic Surgery

## 2021-09-20 ENCOUNTER — Ambulatory Visit (INDEPENDENT_AMBULATORY_CARE_PROVIDER_SITE_OTHER): Payer: Self-pay | Admitting: Podiatrist

## 2021-10-04 ENCOUNTER — Ambulatory Visit (INDEPENDENT_AMBULATORY_CARE_PROVIDER_SITE_OTHER): Payer: Self-pay | Admitting: Podiatrist

## 2021-10-07 ENCOUNTER — Encounter (INDEPENDENT_AMBULATORY_CARE_PROVIDER_SITE_OTHER): Payer: Self-pay | Admitting: Podiatrist

## 2021-10-14 ENCOUNTER — Encounter (INDEPENDENT_AMBULATORY_CARE_PROVIDER_SITE_OTHER): Payer: Self-pay | Admitting: Orthopaedic Surgery of the Spine

## 2021-10-14 ENCOUNTER — Other Ambulatory Visit: Payer: Self-pay

## 2021-10-14 ENCOUNTER — Ambulatory Visit: Payer: 59 | Attending: Orthopaedic Surgery of the Spine | Admitting: Orthopaedic Surgery of the Spine

## 2021-10-14 DIAGNOSIS — M4802 Spinal stenosis, cervical region: Secondary | ICD-10-CM

## 2021-10-14 NOTE — Progress Notes (Unsigned)
PATIENT NAME: Courtney [name], Courtney [facility] NUMBER:  [id number]  DATE OF SERVICE: 10/07/2021  DATE OF BIRTH:  09/19/1965    PROGRESS NOTE    DATE OF SURGERY:  May 30, 2017, L3 through S1 posterior spinal instrumentation fusion-L3 L4-5/decompression.    CHIEF COMPLAINT:  Neck pain.    SUBJECTIVE:  This is a 56 year old established patient of Dr. Warren Lacy who is here today for neck pain in the setting of known cervical stenosis.  The patient was last seen by [place] on September 19, 2019, at which time we had a low concern for myelopathy, but we wanted to check her again in 6 months.  Unfortunately, she did miss several of her followups and is here today mostly because she is complaining of neck pain.  She does state that she has a little bit of difficulty buttoning buttons as well as dropping things, but this is more related to pain in her right hand as well as some peripheral neuropathy.  The patient was diagnosed in January with diabetes, although she feels that it is well controlled with her last A1c reportedly around 7.  As far as her headaches go, she says that she is having headaches 3-4 times a week and it is bad enough where it only wakes her up at night.  The episodes last 15 to 30 minutes.  There are no prodromal symptoms.  These have been going on for about a year.  She said recently she had a right knee steroid injection which actually relieved her headaches for about a month, but they have since recurred and are back to the same frequency as prior to the knee injection.  She denies any gait or balance issues other than some residual limping secondary to the right knee pain.  She denies any significant progression in her gait or balance.    OBJECTIVE:  The patient has 5 over 5 motor strength C5-T1, L2 through S1.  The patient has 1+ reflexes in the Achilles, patella, brachioradialis, biceps, and triceps.  No evidence of Hoffmann's bilaterally.  No evidence of clonus.  She has sensation intact to light touch L2  through S1.  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    IMAGING:  MRI of the cervical spine reviewed by Dr. Warren Lacy.  Per Dr. Madie Reno interpretation, there is some mild-to-moderate stenosis at C6-C7 that appears relatively unchanged when compared to previous MRI done in 2019.  No evidence of significant degenerative changes.    ASSESSMENT:  A 56 year old with recurrent headaches and evidence of C6-C7 stenosis.    PLAN:  At this time, we have a low suspicion that the patient has any significant cord compression that may be causing her headaches.  On exam today, there are no objective findings of myelopathy, however, given that there is some stenosis at C6-C7, we will have to keep a close eye on this.  We would like to follow up with her again in 6 months' time for a myelopathy check.  As far as her headaches go, we cannot be sure what exactly is causing these, but we will refer her to neurology.  We think that the etiology just occurring at night is less likely related to any kind of degenerative changes or cervical stenosis as the cause of this.  She is understanding of this.  We will see her again in 6 months.  Verlon Au, MD    Everardo Beals, MD  Professor and [facility] Department of Orthopaedics                DD:  10/14/2021 12:28:50  DT:  10/14/2021 14:23:55 LL  D#:  [id number]

## 2021-11-12 ENCOUNTER — Encounter (INDEPENDENT_AMBULATORY_CARE_PROVIDER_SITE_OTHER): Payer: Self-pay | Admitting: Podiatrist

## 2021-11-29 ENCOUNTER — Ambulatory Visit (HOSPITAL_COMMUNITY)
Admission: RE | Admit: 2021-11-29 | Discharge: 2021-11-29 | Disposition: A | Discharge status: Home / Self Care | Payer: 59 | Source: Ambulatory Visit | Attending: Podiatrist | Admitting: Podiatrist

## 2021-11-29 ENCOUNTER — Other Ambulatory Visit: Payer: Self-pay

## 2021-11-29 ENCOUNTER — Inpatient Hospital Stay
Admission: RE | Admit: 2021-11-29 | Discharge: 2021-11-29 | Disposition: A | Discharge status: Home / Self Care | Payer: 59 | Source: Ambulatory Visit

## 2021-11-29 ENCOUNTER — Encounter (HOSPITAL_COMMUNITY): Payer: Self-pay

## 2021-11-29 ENCOUNTER — Ambulatory Visit: Payer: 59 | Attending: Podiatrist | Admitting: Podiatrist

## 2021-11-29 ENCOUNTER — Encounter (INDEPENDENT_AMBULATORY_CARE_PROVIDER_SITE_OTHER): Payer: Self-pay | Admitting: Podiatrist

## 2021-11-29 VITALS — BP 129/81 | HR 72 | Temp 97.8°F | Ht 64.72 in | Wt 185.2 lb

## 2021-11-29 VITALS — BP 125/62 | HR 71 | Temp 98.7°F | Resp 20 | Ht 64.45 in | Wt 185.6 lb

## 2021-11-29 DIAGNOSIS — Z01818 Encounter for other preprocedural examination: Secondary | ICD-10-CM | POA: Insufficient documentation

## 2021-11-29 DIAGNOSIS — M79675 Pain in left toe(s): Secondary | ICD-10-CM | POA: Insufficient documentation

## 2021-11-29 DIAGNOSIS — E119 Type 2 diabetes mellitus without complications: Secondary | ICD-10-CM | POA: Insufficient documentation

## 2021-11-29 DIAGNOSIS — M205X2 Other deformities of toe(s) (acquired), left foot: Secondary | ICD-10-CM | POA: Insufficient documentation

## 2021-11-29 DIAGNOSIS — M79672 Pain in left foot: Secondary | ICD-10-CM | POA: Insufficient documentation

## 2021-11-29 DIAGNOSIS — Z8739 Personal history of other diseases of the musculoskeletal system and connective tissue: Secondary | ICD-10-CM | POA: Insufficient documentation

## 2021-11-29 DIAGNOSIS — L84 Corns and callosities: Secondary | ICD-10-CM | POA: Insufficient documentation

## 2021-11-29 DIAGNOSIS — M79671 Pain in right foot: Secondary | ICD-10-CM | POA: Insufficient documentation

## 2021-11-29 DIAGNOSIS — M2040 Other hammer toe(s) (acquired), unspecified foot: Secondary | ICD-10-CM | POA: Insufficient documentation

## 2021-11-29 DIAGNOSIS — G8929 Other chronic pain: Secondary | ICD-10-CM | POA: Insufficient documentation

## 2021-11-29 LAB — POC BLOOD GLUCOSE (RESULTS): GLUCOSE, POC: 90 mg/dl (ref 70–105)

## 2021-11-29 NOTE — H&P (Signed)
[facility]  Pre-Operative H&P    Courtney [name], Courtney [name], 56 y.o. female  Date of Birth:  1966/03/31  Date of service: 11/29/2021    Information Obtained from: patient  Chief Complaint: R foot hallux, 2-5 digit hammertoe deformity with hypertrophic sesamoids.    PCP: Eloise Levels, MD       HPI: (must include no less than 4 of the following main descriptors) Location (of pain): Quality (character of pain) Severity (minimal, mild, severe, scale or 1-10) Duration (how long has pain/sx present) Timing (when does pain/sx occur)  Context (activity at/before onset) Modifying Factors (what makes pain/sx  Better/worse) Associate Sign/Sx (what accompanies main pain/sx)     Courtney [name] is a 56 y.o., White female who presents with right foot sub 1st met head ulcer 2/2 to hallux hammertoe deformity and hypertrophic sesamoids.  Last visit 07/26/2021. Patient states she was recently diagnosed with diabetes type 2 and has started [facility].  Patient recently developed worsening ulceration to right foot sub 1st met head ulcer with pain.  Has completed 2 rounds of oral antibiotic and feels that the area has greatly improved.  States also had wound culture which was positive for staph.  Recently submitted surgical case request for right foot hallux IPJ fusion, digital arthroplasty toes 2-5, partial sesamoidectomy (exostectomy).  Had previous surgical case submitted for for L foot 2nd digit arthroplasty and capsulorrhaphy with temporary K wire fixation.  Patient now rates her right foot much more painful than left.  Pain is currently 0/10 but is much worse with direct pressure. 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  Past surgical hx also includes lesser digit hammertoe repair to toes 2 -4 in the 1990's. 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  She is a former smoker.  She presents to clinic today with her spouse.       Pre-operative Risk Assessment   Pre-operative assessment not obtained R/T .  (Dr Owens Shark, 11/14/2021)    ROS:  MUST comment on all "Abnormal" findings   ROS Other than ROS in the HPI, all other systems were negative.    PAST MEDICAL/ FAMILY/ SOCIAL HISTORY:       Past Medical History:   Diagnosis Date   . Anemia 12/29/2018    iron Deficiency    . Arthritis    . Asthma 12/29/2018    childhood, no inhalers since   . Back problem    . Constipation    . Depression 12/29/2018    Denies SI/HI    . Disorder of liver     Fatty Liver   . Eczema 12/29/2018    hands x 1 year- eczema- seen derm   . Esophageal reflux 12/29/2018    controlled   . Fibromyalgia    . Headache    . Heart murmur 12/29/2018    Reports she was told this in her 102s   . Hx of transfusion 12/29/2018    denies rx   . Hyperlipidemia    . Hypertension    . Hypothyroid    . Nausea with vomiting    . Peripheral edema 12/29/2018    chronic left lower extremity swelling    . Peripheral neuropathy    . Peripheral vascular disease (CMS [place])     left lower extremity    .  PONV (postoperative nausea and vomiting)    . Pre-diabetes    . Problems with swallowing    . Shortness of breath 12/29/2018    DOE- several years   . Sleep apnea 12/29/2018    doesn't wear CPAP    . Thyroid disease    . Type 2 diabetes mellitus (CMS [facility])     Diagnosed 06/2021, Hgba1c- ?   Marland Kitchen Wears glasses          Allergies   Allergen Reactions   . Macrodantin [Nitrofurantoin] Shortness of Breath   . Macrobid [Nitrofurantoin Monohyd/M-Cryst]    . Morphine Swelling   . Zofran [Ondansetron] Nausea/ Vomiting      Cannot display prior to admission medications because the patient has not been admitted in this contact.         No current facility-administered medications for this visit.    Past Surgical History:   Procedure Laterality Date   . ANKLE SURGERY Right    . BLADDER SURGERY     . CESAREAN SECTION      x 3   .  COLONOSCOPY     . HX CARPAL TUNNEL RELEASE Bilateral 2020   . HX HIP REPLACEMENT Bilateral     Right infected and revised   . HX UPPER ENDOSCOPY     . HYSTEROTOMY     . LAMINECTOMY      fusion, x2   . LEG SURGERY Left     x 3 , skin graft - from dog bite         Family Medical History:    None         Social History     Tobacco Use   . Smoking status: Former     Packs/day: 1.00     Years: 25.00     Pack years: 25.00     Types: Cigarettes     Quit date: 08/06/2020     Years since quitting: 1.3   . Smokeless tobacco: Never   . Tobacco comments:     States a few cigarettes over the last few weeks    Vaping Use   . Vaping Use: Never used   Substance Use Topics   . Alcohol use: Not Currently     Comment: twice/month   . Drug use: Not Currently         PHYSICAL EXAMINATION: MUST comment on all "Abnormal" findings    Exam  BP 129/81   Pulse 72   Temp 36.6 C (97.8 F)   Ht 1.644 m (5' 4.72")   Wt 84 kg (185 lb 3 oz)   BMI 31.08 kg/m       General: appears in good health  Eyes: Conjunctiva clear.  HENT:ENMT without erythema or injection, mucous membranes moist.  Neck: no thyromegaly or lymphadenopathy  Lungs: Clear to auscultation bilaterally.   Cardiovascular: regular rate and rhythm  Abdomen: Soft, non-tender  Extremities: Right foot hallux hammertoe with slight abduction and partially reducible hammertoe deformities 2-5, 5th digit also noted to be slightly adducted.  Left foot hammertoe deformities to left 3 lesser toes; partially reducible. Left 2nd digit noted overlapping 3rd digit and in abducted position. No cyanosis or edema  Skin: Right foot sub 1st met head with hypertrophic callus with central discoloration measuring approximately 1.5 mm circular s/p sharp debridement some subcutaneous soft tissue discoloration noted without obvious skin breakdown, positive tender with palpation. Skin warm and dry  Neurologic: Grossly normal  Lymphatics: No lymphadenopathy  Psychiatric: Normal      IMPRESSION:    1. Right  foot sub 1st met pre ulcerative callus with pain likely 2/2 to hallux hammertoe and hypertrophic sesamoids.  Ulceration and infection improved since patient presented to ED  2.  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  Right 5th digit adducted hammertoe deformity  4. Left foot 2nd digit hammertoe abducted and overlapping 3rd digit with pain; failed conservative treatment.   5. DM 2 (HgbA1c 5.7 12/29/2018)    Recommendations       Plan for Right foot hallux IPJ fusion, digits 2-5 arthroplasty with temporary K-wire fixation 2-4, partial sesamoidectomy (exostectomy).    - MAC anesthesia with preoperative popliteal-adductor anesthetic block.  - CAM boot  - Bone stimulator to be dispensed day of surgery  - Percocet 5/362m PO q 6hrs x 5 days    Opioid Prescription - First Prescription  Diagnosis requiring prescription: post operative pain control for above planned procedure    Medication Dosage/Frequency being prescribed: Percocet 5/325 mg PO Q 6 hours x 5 days    I have reviewed prior medication history in the medical record for this patient.  I have also reviewed information contained in the state controlled prescription drug monitoring database.    I have discussed any history of non-pharmacological treatment with the patient.  The patient reports no prior treatment history.      The patient denies history of substance abuse treatment.      Physical exam findings and/or clinical history warranting use of opioid treatment include: post operative pain control for above planned procedure    My goals for treatment include: Preoperative popliteal/adductor anesthetic block and lowest effective dose 5/325 mg    I have discussed the risk of opioid addiction with the patient.  I have also discussed the risk of using sedatives and alcohol while taking opioids.      DVT/PE Prophylaxis: Not indicated, low risk for VTE    Patient has decision making capacity:  yes  Advance Directive:  No Advance  Directive  DNR Status prior to admission:  Full Code  DNR Status this admission:  Full Code    Disposition Planning: Home discharge       I am scribing for, and in the presence of, KBerniece Pap DPM, for services provided on 11/29/2021.  JKyra Leyland SCRIBE    I personally performed the services described in this documentation, as scribed  in my presence, and it is both accurate  and complete.    KBerniece Pap DPM      KBerniece Pap DPM  Asst Professor - Dept of Orthopaedics  Podiatry  Pager [phone]

## 2021-12-06 ENCOUNTER — Other Ambulatory Visit: Payer: Self-pay

## 2021-12-06 ENCOUNTER — Ambulatory Visit (HOSPITAL_BASED_OUTPATIENT_CLINIC_OR_DEPARTMENT_OTHER): Payer: 59 | Admitting: Student in an Organized Health Care Education/Training Program

## 2021-12-06 ENCOUNTER — Ambulatory Visit (HOSPITAL_COMMUNITY): Payer: 59 | Admitting: Student in an Organized Health Care Education/Training Program

## 2021-12-06 ENCOUNTER — Ambulatory Visit (HOSPITAL_COMMUNITY): Payer: 59 | Admitting: NURSE PRACTITIONER

## 2021-12-06 ENCOUNTER — Other Ambulatory Visit (HOSPITAL_COMMUNITY): Payer: 59

## 2021-12-06 ENCOUNTER — Encounter (HOSPITAL_COMMUNITY): Payer: Self-pay | Admitting: Podiatrist

## 2021-12-06 ENCOUNTER — Ambulatory Visit
Admission: RE | Admit: 2021-12-06 | Discharge: 2021-12-06 | Disposition: A | Discharge status: Home / Self Care | Payer: 59 | Source: Ambulatory Visit | Attending: Podiatrist | Admitting: Podiatrist

## 2021-12-06 ENCOUNTER — Ambulatory Visit (HOSPITAL_COMMUNITY): Payer: 59 | Admitting: Podiatrist

## 2021-12-06 ENCOUNTER — Encounter (HOSPITAL_COMMUNITY)
Admission: RE | Disposition: A | Discharge status: Home / Self Care | Payer: Self-pay | Source: Ambulatory Visit | Attending: Podiatrist

## 2021-12-06 ENCOUNTER — Inpatient Hospital Stay (HOSPITAL_COMMUNITY): Payer: 59

## 2021-12-06 DIAGNOSIS — M2041 Other hammer toe(s) (acquired), right foot: Secondary | ICD-10-CM | POA: Insufficient documentation

## 2021-12-06 DIAGNOSIS — M79671 Pain in right foot: Secondary | ICD-10-CM

## 2021-12-06 DIAGNOSIS — M205X1 Other deformities of toe(s) (acquired), right foot: Secondary | ICD-10-CM | POA: Insufficient documentation

## 2021-12-06 DIAGNOSIS — M898X7 Other specified disorders of bone, ankle and foot: Secondary | ICD-10-CM | POA: Insufficient documentation

## 2021-12-06 LAB — POC BLOOD GLUCOSE (RESULTS)
GLUCOSE, POC: 82 mg/dl (ref 70–105)
GLUCOSE, POC: 80 mg/dl (ref 70–105)

## 2021-12-06 SURGERY — ARTHROPLASTY HAMMERTOE CORRECTION
Anesthesia: Monitor Anesthesia Care | Site: Foot | Laterality: Right | Wound class: Clean Wound: Uninfected operative wounds in which no inflammation occurred

## 2021-12-06 MED ORDER — LACTATED RINGERS INTRAVENOUS SOLUTION
INTRAVENOUS | Status: DC
Start: 2021-12-06 — End: 2021-12-06

## 2021-12-06 MED ORDER — ROPIVACAINE (PF) 5 MG/ML (0.5 %) INJECTION SOLUTION
30.0000 mL | Freq: Once | INTRAMUSCULAR | Status: DC | PRN
Start: 2021-12-06 — End: 2021-12-06

## 2021-12-06 MED ORDER — PROPOFOL 10 MG/ML INTRAVENOUS EMULSION
INTRAVENOUS | Status: DC | PRN
Start: 2021-12-06 — End: 2021-12-06
  Administered 2021-12-06: 50 ug/kg/min via INTRAVENOUS
  Administered 2021-12-06: 75 ug/kg/min via INTRAVENOUS
  Administered 2021-12-06: 150 ug/kg/min via INTRAVENOUS
  Administered 2021-12-06: 0 ug/kg/min via INTRAVENOUS
  Administered 2021-12-06: 100 ug/kg/min via INTRAVENOUS
  Administered 2021-12-06: 70 ug/kg/min via INTRAVENOUS
  Administered 2021-12-06: 60 ug/kg/min via INTRAVENOUS
  Administered 2021-12-06: 100 ug/kg/min via INTRAVENOUS
  Administered 2021-12-06: 60 ug/kg/min via INTRAVENOUS

## 2021-12-06 MED ORDER — DEXTROSE 5% IN WATER (D5W) FLUSH BAG - 250 ML
INTRAVENOUS | Status: DC | PRN
Start: 2021-12-06 — End: 2021-12-06

## 2021-12-06 MED ORDER — FENTANYL (PF) 50 MCG/ML INJECTION SOLUTION
100.0000 ug | Freq: Once | INTRAMUSCULAR | Status: DC | PRN
Start: 2021-12-06 — End: 2021-12-06
  Administered 2021-12-06: 100 ug via INTRAVENOUS
  Filled 2021-12-06: qty 2

## 2021-12-06 MED ORDER — SODIUM CHLORIDE 0.9 % (FLUSH) INJECTION SYRINGE
2.00 mL | INJECTION | INTRAMUSCULAR | Status: DC | PRN
Start: 2021-12-06 — End: 2021-12-06

## 2021-12-06 MED ORDER — PHENYLEPHRINE 1 MG/10 ML (100 MCG/ML) IN 0.9 % SOD.CHLORIDE IV SYRINGE
INJECTION | Freq: Once | INTRAVENOUS | Status: DC | PRN
Start: 2021-12-06 — End: 2021-12-06
  Administered 2021-12-06 (×2): 100 ug via INTRAVENOUS

## 2021-12-06 MED ORDER — PROPOFOL 10 MG/ML INTRAVENOUS EMULSION
INTRAVENOUS | Status: AC
Start: 2021-12-06 — End: 2021-12-06
  Filled 2021-12-06: qty 50

## 2021-12-06 MED ORDER — APREPITANT 40 MG CAPSULE
40.0000 mg | ORAL_CAPSULE | Freq: Once | ORAL | Status: AC
Start: 2021-12-06 — End: 2021-12-06
  Administered 2021-12-06: 40 mg via ORAL
  Filled 2021-12-06: qty 1

## 2021-12-06 MED ORDER — SODIUM CHLORIDE 0.9 % (FLUSH) INJECTION SYRINGE
2.0000 mL | INJECTION | INTRAMUSCULAR | Status: DC | PRN
Start: 2021-12-06 — End: 2021-12-06

## 2021-12-06 MED ORDER — SODIUM CHLORIDE 0.9 % (FLUSH) INJECTION SYRINGE
20.0000 mL | INJECTION | Freq: Once | INTRAMUSCULAR | Status: DC | PRN
Start: 2021-12-06 — End: 2021-12-06

## 2021-12-06 MED ORDER — NALOXONE 4 MG/ACTUATION NASAL SPRAY
1.0000 | NASAL | 0 refills | Status: DC | PRN
Start: 2021-12-06 — End: 2022-10-08
  Filled 2021-12-06: qty 2, 1d supply, fill #0

## 2021-12-06 MED ORDER — BUPIVACAINE (PF) 0.5 % (5 MG/ML) INJECTION SOLUTION
30.0000 mL | Freq: Once | INTRAMUSCULAR | Status: DC | PRN
Start: 2021-12-06 — End: 2021-12-06
  Administered 2021-12-06: 30 mL via INTRAMUSCULAR

## 2021-12-06 MED ORDER — SODIUM CHLORIDE 0.9% FLUSH BAG - 250 ML
INTRAVENOUS | Status: DC | PRN
Start: 2021-12-06 — End: 2021-12-06

## 2021-12-06 MED ORDER — SODIUM CHLORIDE 0.9 % (FLUSH) INJECTION SYRINGE
2.00 mL | INJECTION | Freq: Three times a day (TID) | INTRAMUSCULAR | Status: DC
Start: 2021-12-06 — End: 2021-12-06

## 2021-12-06 MED ORDER — SODIUM CHLORIDE 0.9 % (FLUSH) INJECTION SYRINGE
2.0000 mL | INJECTION | Freq: Three times a day (TID) | INTRAMUSCULAR | Status: DC
Start: 2021-12-06 — End: 2021-12-06

## 2021-12-06 MED ORDER — DEXTROSE 5 % IN WATER (D5W) INTRAVENOUS SOLUTION
2.0000 g | Freq: Once | INTRAVENOUS | Status: AC
Start: 2021-12-06 — End: 2021-12-06
  Administered 2021-12-06 (×2): 2 g via INTRAVENOUS
  Filled 2021-12-06: qty 20

## 2021-12-06 MED ORDER — LACTATED RINGERS INTRAVENOUS SOLUTION
INTRAVENOUS | Status: DC
Start: 2021-12-06 — End: 2021-12-06
  Administered 2021-12-06: 0 mL via INTRAVENOUS

## 2021-12-06 MED ORDER — PROPOFOL 10 MG/ML IV BOLUS
INJECTION | Freq: Once | INTRAVENOUS | Status: DC | PRN
Start: 2021-12-06 — End: 2021-12-06
  Administered 2021-12-06 (×2): 20 mg via INTRAVENOUS

## 2021-12-06 MED ORDER — ACETAMINOPHEN 325 MG TABLET
650.0000 mg | ORAL_TABLET | Freq: Once | ORAL | Status: AC
Start: 2021-12-06 — End: 2021-12-06
  Administered 2021-12-06: 650 mg via ORAL
  Filled 2021-12-06: qty 2

## 2021-12-06 MED ORDER — FENTANYL (PF) 50 MCG/ML INJECTION SOLUTION
25.0000 ug | INTRAMUSCULAR | Status: DC | PRN
Start: 2021-12-06 — End: 2021-12-06

## 2021-12-06 MED ORDER — MIDAZOLAM 1 MG/ML INJECTION SOLUTION
2.0000 mg | Freq: Once | INTRAMUSCULAR | Status: DC | PRN
Start: 2021-12-06 — End: 2021-12-06
  Administered 2021-12-06: 2 mg via INTRAVENOUS
  Filled 2021-12-06: qty 2

## 2021-12-06 MED ORDER — OXYCODONE-ACETAMINOPHEN 5 MG-325 MG TABLET
1.0000 | ORAL_TABLET | Freq: Four times a day (QID) | ORAL | 0 refills | Status: AC | PRN
Start: 2021-12-06 — End: 2021-12-11
  Filled 2021-12-06: qty 20, 5d supply, fill #0

## 2021-12-06 MED ORDER — SCOPOLAMINE 1 MG OVER 3 DAYS TRANSDERMAL PATCH
1.0000 | MEDICATED_PATCH | Freq: Once | TRANSDERMAL | Status: DC
Start: 2021-12-06 — End: 2021-12-06
  Administered 2021-12-06: 1 via TRANSDERMAL
  Filled 2021-12-06: qty 1

## 2021-12-06 MED ORDER — FENTANYL (PF) 50 MCG/ML INJECTION SOLUTION
12.5000 ug | INTRAMUSCULAR | Status: DC | PRN
Start: 2021-12-06 — End: 2021-12-06

## 2021-12-06 MED ORDER — LACTATED RINGERS INTRAVENOUS SOLUTION
INTRAVENOUS | Status: DC | PRN
Start: 2021-12-06 — End: 2021-12-06

## 2021-12-06 SURGICAL SUPPLY — 48 items
BANDAGE 5.5YDX4IN NONST ELAS KNIT 2 SLFCLS COTTON COMPRESS (WOUND CARE SUPPLY) ×2 IMPLANT
BANDAGE ESMARK 12FTX4IN STRL SYN COMPRESS LF (WOUND CARE SUPPLY) ×2 IMPLANT
BANDAGE ESMARK 12FTX4IN STRL S_YN COMPRESS LF (WOUND CARE/ENTEROSTOMAL SUPPLY) ×1
BIT DRILL 3MM CHARLOTTE CANN 4.3MM SCREW NONST (SURGICAL CUTTING SUPPLIES) ×2 IMPLANT
BIT DRILL CHARLOTTE MU COMPRESS CANN HEAD 4.3 MM SCREW (SURGICAL CUTTING SUPPLIES) ×2 IMPLANT
BLADE RASP 11X5MM SM TPS SS TE_AR XCUT STRL (CUTTING ELEMENTS) ×1
BLADE SAW .7X.22IN OSC SGTL RE_CIP THK.02IN MICRO TPS (CUTTING ELEMENTS) ×1
BLADE SAW 18.5X5.5X.51MM AGRS SS MED NRW COR TPS STRL (SURGICAL CUTTING SUPPLIES) ×2 IMPLANT
BLADE SAW 31X9MM OSCILLATE SGT_L SS LONG MED PREC STRL (CUTTING ELEMENTS) ×1
BLADE SAW 9MM OSCILLATE SGTL SS THK.51MM 31MM MED LONG PREC STRL LF  DISP (SURGICAL CUTTING SUPPLIES) ×2 IMPLANT
BLANKET MISTRAL-AIR ADULT UPR BODY 79X29.9IN FRC AIR HI VOL BLWR INTUITIVE CONTROL PNL LRG LED (MED SURG SUPPLIES) ×2 IMPLANT
BLANKET MISTRAL-AIR UPR BODY 7_8.7X29.9IN FRC AIR WARM (MED SURG SUPPLIES) ×1
CONV USE 328206 - HOSE EXT 36IN 2 POS LOCK CONN STRL LF  DISP (MED SURG SUPPLIES) ×2 IMPLANT
CONV USE 405185 - CUFF TOURNIQUET 18X4IN ATS CYL 2 PORT 1 BLADDER SLEEVE STRL LF  DISP (MED SURG SUPPLIES) ×2 IMPLANT
CONV USE ITEM 338663 - PACK SURG CUSTOM EXTREMITY NONST DISP LF (CUSTOM TRAYS & PACK) ×2 IMPLANT
COVER REINF OVRHD HI BARRIER STRTH LF  DISP REG 90X77IN TBL STRL SMS (DRAPE/PACKS/SHEETS/OR TOWEL) ×2 IMPLANT
COVER REINF OVRHD HI BARRIER S_TRTH LF DISP REG 90X77IN TBL (DRAPE/PACKS/SHEETS/OR TOWEL) ×2
CUFF TOURNIQUET 18X4IN ATS CYL 2 PORT 1 BLADDER SLEEVE STRL LF  DISP (MED SURG SUPPLIES) ×2
CUFF TOURNIQUET RD 18X4IN ATS_CYL 1 PORT 1 BLADDER LL CONN (MED SURG SUPPLIES) ×1
DEVICE DRUG DEL 20MM STRL LF (MED SURG SUPPLIES) ×3 IMPLANT
DRAPE FLRSCN CARM STRAP 85X54IN MINI KOVER LF  STRL EQP POLY (DRAPE/PACKS/SHEETS/OR TOWEL) ×2 IMPLANT
DRAPE FLRSCN CARM STRAP 85X54I_N MINI KOVER LF STRL EQP POLY (DRAPE/PACKS/SHEETS/OR TOWEL) ×1
DRESS WOUND 4X4IN JUMPSTART ANTIMIC ORDER IN MULTIPLES OF 10 EACH (WOUND CARE SUPPLY) ×2 IMPLANT
DRESSING JUMPSTART 4X4 (WOUND CARE/ENTEROSTOMAL SUPPLY) ×1
DRILL 4.3MM CANN HEAD_44112011 (CUTTING ELEMENTS) ×1
DRILL BIT 3.0MM CANN_44112003 (CUTTING ELEMENTS) ×1
GARMENT COMPRESS MED CALF CENTAURA NYL VASOGRAD LTWT BRTHBL SEQ FIL BLU 18- IN (MED SURG SUPPLIES) ×2 IMPLANT
GARMENT COMPRESS MED CALF CENT_AURA NYL VASOGRAD LTWT BRTHBL (MED SURG SUPPLIES) ×1
GRAFT BONE AUG 1.5CC ALLOGRAFT INJ (IMPLANTS TRAUMA) ×2 IMPLANT
HOSE EXT 36IN 2 POS LOCK CONN STRL LF  DISP (MED SURG SUPPLIES) ×2
HOSE EXT 36IN 2 POS LOCK CONN_STRL LF DISP (MED SURG SUPPLIES) ×2
MBO USE ITEM 133984 - BANDAGE 5.5YDX4IN NONST ELAS KNIT 2 SLFCLS COTTON COMPRESS (WOUND CARE/ENTEROSTOMAL SUPPLY) ×3
NEEDLE FR EYE INTES .748IN_1/2 CIRCLE 2129-5 (SUTURE/WOUND CLOSURE) IMPLANT
PACK CUSTOM EXTREMITY (CUSTOM TRAYS & PACK) ×1
PACK SURG CSTM EXTREMITY NONST DISP LF (CUSTOM TRAYS & PACK) ×2
PADDING CAST 4YDX4IN SFRL COTTON ABS UNDCST STRL LF (ORTHOPEDICS (NOT IMPLANTS)) ×4 IMPLANT
PADDING CAST 4YDX4IN SFRL COT_ABS UNDCST STRL LF (ORTHOPEDICS (NOT IMPLANTS)) ×4
RASP SURG 11X5MM SM TPS SS TEAR XCUT DISP STRL LF (SURGICAL CUTTING SUPPLIES) ×2 IMPLANT
SCREW BONE CHARLOTTE 4.3MM 40MM MU COMPRESS CANN HDLS SS FOOT ANKL ST (IMPLANTS TRAUMA) ×2 IMPLANT
SPONGE GAUZE 4X4IN MDCHC COTTON 12 PLY TY 7 LF  STRL DISP (WOUND CARE SUPPLY) ×2 IMPLANT
SPONGE GAUZE 4X4IN MDCHC COTTO_N 12 PLY TY 7 LF STRL DISP (WOUND CARE/ENTEROSTOMAL SUPPLY) ×1
STIM BONE EXTERN BONE GRW NINVS (SURGICAL CUTTING SUPPLIES) ×2 IMPLANT
STIM BONE EXTERN BONE GRW NINV_S (CUTTING ELEMENTS) ×1
STKNT ORTHO 72X6IN COTTON ALBHL 1 PLY PCUT LF  TUB STRL NATURAL (ORTHOPEDICS (NOT IMPLANTS)) ×2 IMPLANT
STOCKINETTE ORTHO 6X6IN PRECUT_COTTON STRL 7666 (ORTHOPEDICS (NOT IMPLANTS)) ×2
TIP SUCT ARGYLE CURITY FRZR 12FR BRSS PLASTIC NI REM OBTURATOR NCDTV HNDL PLBL STRL LF  DISP (ENDOSCOPIC SUPPLIES) ×2 IMPLANT
TUBE FRAZIER INST SUCT 12FR_BG/20 166026 (INSTRUMENTS ENDOMECHANICAL) ×1
WIRE FIX .045IN 5IN CWR TROCAR TIP LF (IMPLANTS TRAUMA) ×8 IMPLANT

## 2021-12-06 NOTE — Discharge Instructions (Signed)
SURGICAL DISCHARGE INSTRUCTIONS     Dr. Phillips Climes, Samara Deist, DPM  performed your ARTHROPLASTY HAMMERTOE CORRECTION, ARTHRODESIS FUSION METATARSOPHALANGEAL JOINT, PARTIAL SESAMOIDECTOMY FOOT today at the [facility]    [facility]:  Monday through Friday from 6 a.m. - 7 p.m.: [phone]  Between 7 p.m. - 6 a.m., weekends and holidays:  Call [facility] at [phone] or [phone].    PLEASE SEE WRITTEN HANDOUTS AS DISCUSSED BY YOUR NURSE:      SIGNS AND SYMPTOMS OF A WOUND / INCISION INFECTION   Be sure to watch for the following:  Increase in redness or red streaks near or around the wound or incision.  Increase in pain that is intense or severe and cannot be relieved by the pain medication that your doctor has given you.  Increase in swelling that cannot be relieved by elevation of a body part, or by applying ice, if permitted.  Increase in drainage, or if yellow / green in color and smells bad. This could be on a dressing or a cast.  Increase in fever for longer than 24 hours, or an increase that is higher than 101 degrees Fahrenheit (normal body temperature is 98 degrees Fahrenheit). The incision may feel warm to the touch.    **CALL YOUR DOCTOR IF ONE OR MORE OF THESE SIGNS / SYMPTOMS SHOULD OCCUR.    ANESTHESIA INFORMATION   ANESTHESIA -- ADULT PATIENTS:  You have received intravenous sedation / general anesthesia, and you may feel drowsy and light-headed for several hours. You may even experience some forgetfulness of the procedure. DO NOT DRIVE A MOTOR VEHICLE or perform any activity requiring complete alertness or coordination until you feel fully awake in about 24-48 hours. Do not drink alcoholic beverages for at least 24 hours. Do not stay alone, you must have a responsible adult available to be with you. You may also experience a dry mouth or nausea for 24 hours. This is a normal side effect and will disappear as the effects of the medication wear off.    REMEMBER   If you  experience any difficulty breathing, chest pain, bleeding that you feel is excessive, persistent nausea or vomiting or for any other concerns:  Call your physician Dr. Phillips Climes at [phone] or [phone]. You may also ask to have the [facility] doctor on call paged. They are available to you 24 hours a day.    SPECIAL INSTRUCTIONS / COMMENTS       FOLLOW-UP APPOINTMENTS   Please call patient services at [phone] or [phone] to schedule a date / time of return. They are open Monday - Friday from 7:30 am - 5:00 pm.

## 2021-12-06 NOTE — OR Surgeon (Signed)
PATIENT NAME: Courtney [name] NUMBER: [id number]  DATE OF SERVICE: 12/06/2021  DATE OF BIRTH: [birth year]      Pre-Operative Diagnosis:   1. Right foot prominent sesamoid bones  2. Right foot hallux hammertoe  3. Right foot hammertoe deformity digits 2, 3, 4  4. 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 Right foot prominent sesamoid bones  2. Right foot hallux hammertoe  3. Right foot hammertoe deformity digits 2, 3, 4  4. 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  Right foot partial resection of sesamoid bones (exostectomy)  2. Right foot hallux interphalangeal joint fusion with screw fixation and augmentation with Bone Augment  3. Right foot arthroplasty with temporary K-wire fixation digits 2, 3, 4  4. Right foot derotational arthroplasty digit 5    Findings/Complexity (inherent to the procedure performed):  Right foot prominent sesamoid bones with tibial sesamoid larger and both sesamoids adhered to plantar 1st metatarsal capsule, nonreducible  hallux hammertoe deformity, minimally reducible  hammertoe deformities digits 2, 3, 4, adducted and internally rotated hammertoe deformity digit 5    Attending Surgeon: Berniece Pap, DPM  Assistant(s):  No    Anesthesia Type: Monitored Anesthesia Care (MAC)  Estimated Blood Loss:  less than 50 ml  Hemostasis:  Sterile ankle tourniquet  Blood Given:  None  Fluids Given:  See anesthesia notes    Complications (not routinely expected or not inherent to difficulty/nature of procedure):  None    Characteristic Event (routinely expected or inherent to the difficulty/nature of the procedure): Right foot prominent sesamoid bones with tibial sesamoid larger and both sesamoids adhered to plantar 1st metatarsal capsule, nonreducible  hallux hammertoe deformity, minimally reducible  hammertoe deformities digits 2, 3, 4, adducted and internally rotated  hammertoe deformity digit 5    Did the use of current and/or prior Anticoagulants impact the outcome of the case? N\A    Wound Class: Clean Wound: Uninfected operative wounds in which no inflammation occurred    Tubes: None  Drains: None  Specimens/ Cultures:  None  Implants:  [facility] short thread compression screw 4.3 x 40 mm, Bone Augment, 0.045 mm K-wire x3           Disposition: PACU - hemodynamically stable.  Condition: stable    Procedure in detail:    The patient received a preoperative popliteal/adductor anesthetic block by the preoperative anesthesia team.  The patient was  then brought into the operating room and placed on the operating table in a supine position.  A time-out was performed to verify the patient's name, procedure, and side of procedure using the patient's consent form. Following 2 grams of IV Ancef, and  IV sedation, the right lower extremity was then prepped and draped in the usual sterile manner.  An Esmarch bandage was applied for exsanguination purposes, and the ankle tourniquet was then inflated to 250 mmHg.    Attention was first directed to the plantar aspect of the 1st metatarsal head overlying the prominent sesamoids.  A curvilinear incision was placed from the 1st metatarsal sulcus and extending over the medial aspect of the 1st metatarsal head.  A 15 blade was then used to make an incision down to the joint capsule and sesamoids.  All soft tissue attachments were released using blunt and sharp dissection.  Any bleeding vessels were cauterized using Bovie.  The flexor  tendon was then retracted away from the sesamoid bone and the fibular and tibial sesamoid was then partially resected and planed down using a sagittal saw.  The edges were then smoothed with a power rasp.  Mini C-arm was used to confirm adequate reduction in the bony deformity.  As well, the foot was placed in weight-bearing position and the area was palpated and noted to have no palpable bony  prominence residually.  Area was then flushed with sterile saline.  The deep soft tissue layers were then reapproximated using 4-0 Vicryl.  The skin edges were then reapproximated using 3-0 Prolene using simple and horizontal suture technique.    Attention was then directed to the dorsal aspect of the hallux overlying the interphalangeal joint.  Transverse incision was placed overlying the joint.  The incision was deepened down to the level of the extensor tendon which was transected and marked with a mosquito.  The capsule was then opened.  All soft tissue attachments surrounding the head of the proximal phalanx and base of the distal phalanx were then freed.  A sagittal saw was then used to remove the head of the proximal phalanx and base of the distal phalanx, removing all cartilage.  The joint was then placed in a reduced position mini C-arm was used to confirm adequate reduction in the deformity.  Dear was then flushed with sterile saline.  The cut bone ends were then further fenestrated with a K-wire.  A portion of bone augment was then injected into the intended joint line.  A 0.045 K-wire was then advanced through the distal phalanx and retrograded through the proximal phalanx.  Mini C-arm was used to confirm adequate position in the K-wire.  The K-wire was then measured.  A drill was then placed over the K-wire and drilled past the intended joint fusion line.  A countersink was then used.  A 4.3 x 40 mm cannulated short thread [facility] screw was then advanced over the K-wire.  Additional bone augment was then injected prior to full compression.  Adequate position and advancement of the screw was confirmed using mini C-arm.  The soft tissue layers were then reapproximated using 4-0 Vicryl.  The skin edges were then reapproximated using 4-0 Prolene using simple and horizontal suture technique.      Attention was then directed to the 2nd digit where a linear longitudinal incision was made  dorsally over the apex of the deformity at the level of the proximal interphalangeal joint and extending to the metatarsophalangeal joint.  The incision was deepened down to the capsular structures via sharp and blunt dissection.  The collateral ligaments were released and the extensor tendon reflected proximally to the level of the metatarsophalangeal joint. A capsulotomy was then performed thus exposing the head of the proximal phalanx and base of the intermediate phalanx. Using an oscillating bone saw, the head of the proximal phalanx was resected.  Further reduction was determined to be needed therefore, the flexor tendons were then released at the level of the proximal interphalangeal joint.  Adequate reduction in the deformity was then determined and adequate resection of bone was confirmed using mini C-arm.  Area was then flushed with sterile saline.  A 0.045 K-wire was then advanced through the 2nd digit prior to soft tissue closing, crossing the metatarsal phalangeal joint and proximal interphalangeal joint. The extensor tendon was re-approximated with 4-0 Vicryl, the capsule and soft tissue structures were then closed with 4-0 Vicryl and the skin was closed with  4-0 Prolene using horizontal and simple suture technique.      Attention was then directed to the 3rd digit where a linear longitudinal incision was made dorsally over the apex of the deformity at the level of the proximal interphalangeal joint and extending to the metatarsophalangeal joint.  The incision was deepened down to the capsular structures via sharp and blunt dissection.  The collateral ligaments were released and the extensor tendon reflected proximally to the level of the metatarsophalangeal joint. A capsulotomy was then performed thus exposing the head of the proximal phalanx and base of the intermediate phalanx. Using an oscillating bone saw, the head of the proximal phalanx was resected.  Further reduction was determined to be needed  therefore, the flexor tendons were then released at the level of the proximal interphalangeal joint.  Adequate reduction in the deformity was then determined and adequate resection of bone was confirmed using mini C-arm.  Area was then flushed with sterile saline.  A 0.045 K-wire was then advanced through the 3rd digit prior to soft tissue closing, crossing the metatarsal phalangeal joint and proximal interphalangeal joint. The extensor tendon was re-approximated with 4-0 Vicryl, the capsule and soft tissue structures were then closed with 4-0 Vicryl and the skin was closed with 4-0 Prolene using horizontal and simple suture technique.      Attention was then directed to the 4th digit where a linear longitudinal incision was made dorsally over the apex of the deformity at the level of the proximal interphalangeal joint and extending to the metatarsophalangeal joint.  The incision was deepened down to the capsular structures via sharp and blunt dissection.  The collateral ligaments were released and the extensor tendon reflected proximally to the level of the metatarsophalangeal joint. A capsulotomy was then performed thus exposing the head of the proximal phalanx and base of the intermediate phalanx. Using an oscillating bone saw, the head of the proximal phalanx was resected.  Further reduction was determined to be needed therefore, the flexor tendons were then released at the level of the proximal interphalangeal joint.  Adequate reduction in the deformity was then determined and adequate resection of bone was confirmed using mini C-arm.  Area was then flushed with sterile saline.  A 0.045 K-wire was then advanced through the 4th digit prior to soft tissue closing, crossing the metatarsal phalangeal joint and proximal interphalangeal joint. The extensor tendon was re-approximated with 4-0 Vicryl, the capsule and soft tissue structures were then closed with 4-0 Vicryl and the skin was closed with 4-0 Prolene using  horizontal and simple suture technique.      Attention was then directed to the 5th digit where a lenticular incision oriented distal medial to proximal lateral was placed dorsally over the apex of the deformity at the level of the proximal interphalangeal joint.  Incision was deepened down to the capsular structures via sharp and blunt dissection.  The collateral ligaments were released and the extensor tendon reflected proximally.  Capsulotomy was then performed thus exposing the head of the proximal phalanx.  Using an oscillating bone saw, the head of the proximal phalanx was then resected with a slight oblique orientation.  Further reduction was determined to be needed therefore the flexor tendons were then released at the level of the proximal interphalangeal joint.  Adequate reduction in the deformity was then determined that adequate resection of bone confirmed using mini C-arm.  The area was then flushed with sterile saline.  The extensor tendon was then reapproximated using 4-0  Vicryl.  Capsule and soft tissue structures were then closed with 4-0 Vicryl and the skin reapproximated with 4-0 Prolene using simple and horizontal suture technique.      All surgical incisions were then dressed with saline dampened jump-start dressing, saline dampened 4 x 4 gauze, dry 4 x 4 gauze, soft roll, and ACE wrap.  The distal pin sites were then dressed with Xeroform.  A CAM boot was then placed on the right lower extremity    The patient tolerated procedure and anesthesia well, and left the OR for the recovery room with vital signs stable and vascular status intact as indicated by immediate hyperemia to digits 1 through 5 of the right foot upon release of the ankle tourniquet.    The patient will be discharged home when medically stable in fully recovered from anesthesia.    This note may have been partially generated using MModal Fluency Direct system and there may be some incorrect words, spellings, and punctuation that  were not noted in checking the note before saving.        Berniece Pap, DPM  Asst Professor - Dept of Orthopaedics  Podiatry  Pager [phone]

## 2021-12-06 NOTE — Anesthesia Procedure Notes (Addendum)
Courtney [name]    Block: Peripheral Block    Performed By:   Macario Carls Provider:  Neoma Laming, MD  Performing Provider:  Tresa Moore, DO  I was present and supervised/observed the entire procedure.  Neoma Laming, MD 12/06/2021, 09:04   Sedation    Sedation Start Time  12/06/2021 8:12 AM   Sedation Stop Time 12/06/2021 8:21 AM  Blocks  Right sciatic Popliteal Fossa  Type of Block: single shot  Ultrasound used for needle placement, imaging, supervision, and interpretation  Image:  images available in PACS  Diagnosis: foot pain   Indication: Requested by surgeon and Acute post operative pain   Pt location: at Bedside    Site verified, H&P updated and consent obtained, Patient monitors applied, Timeout performed, Emergency drugs and equipment available, Patient positioned and anesthesia consent given  Technique(See MAR for doses)       Preprocedure hand washing was performed sterile field maintained      Sterile Skin Prep : Aseptic technique, Sterile gloves, Supplies assembled on sterile field, Mask, cap, Sterile technique, Hand hygiene performed, Sterile field established and Sterilely prepped and draped    Skin prepped with: Chlorhexidine gluconate and isopropyl alcohol    Skin Local      Needle  Needle type: Stimuplex     Needle Gauge: 21G   Needle length: 4 in.  Needle localization: anatomical landmarks and ultrasound guidance  Number of attempts: 1      Catheter          Site      Medications    Assessment  Injection assessment: negative aspiration for heme and local visualized surrounding nerve on ultrasound  Paresthesia pain: none        Events     Patient tolerance of procedure: tolerated well, no immediate complications        NOTES: Continuous ultrasound was used to guide the needle tip in close proximity of the neural target.  There was minimal disruption of musculoskeletal and vascular structures with exceptions as noted. An in-plane long-axis image was saved to the patient's medical record.   Appropriate hydrodissection was achieved and no intraneural injection was appreciated/occurred.    CPT Code 96789    Tresa Moore, DO 12/06/2021   [facility] Pain Service  [facility] Anesthesiology Department  [phone]

## 2021-12-06 NOTE — Consults (Signed)
[facility]  Acute Perioperative Pain Service Consult         Date of Service:  12/06/2021  Courtney, [name], 56 y.o. female  Encounter Start Date:  12/06/2021  Inpatient Admission Date:    Date of Birth:  [birth year]    Plan:   Courtney [name] is a 56 y.o. female who is scheduled for   with anticipated post-operative pain.  - Offered patient single shot adductor canal and popliteal fossa peripheral nerve blocks. Patient agreeable to procedure.   - Scheduled and PRN medications per primary service.   - For any questions please page or call the Acute Perioperative Pain Service at [phone].   - If not already done, please place consult order to IP Acute Perioperative Pain --RAP Thank you for your consultation.     Suggested Coding/Billing: Level 3    Type of Pain Consultation: Perioperative Pain  Consult Requested By: Dr. Phillips Climes  Reason for Consult: Acute Postoperative Pain (ICD-10-G89.18)  Laterality right, Type of Surgery, as above     Chief Complaint/Pain location: right foot pain     History of Present Illness: Courtney [name] is a 56 y.o., White female  with chief complaint as above. Anesthesiology Perioperative Pain service consulted for evaluation of right foot pain. The pain is anticipated to be described as sharp post surgical pain. Pain will be located at the surgical site. Onset is anticipated to start post-operatively. Aggravating factors: movement.  Alleviating factors: pain medications.       Past Medical History:  Past Medical History:   Diagnosis Date    Anemia 12/29/2018    iron Deficiency     Arthritis     Asthma 12/29/2018    childhood, no inhalers since    Back problem     Constipation     Depression 12/29/2018    Denies SI/HI     Disorder of liver     Fatty Liver    Eczema 12/29/2018    hands x 1 year- eczema- seen derm    Esophageal reflux 12/29/2018    controlled    Fibromyalgia     Headache     Heart murmur 12/29/2018    Reports she was told this in her 31s    Hx of  transfusion 12/29/2018    denies rx    Hyperlipidemia     Hypertension     Hypothyroid     Nausea with vomiting     Peripheral edema 12/29/2018    chronic left lower extremity swelling     Peripheral neuropathy     Peripheral vascular disease (CMS HCC)     left lower extremity     PONV (postoperative nausea and vomiting)     Pre-diabetes     Problems with swallowing     Shortness of breath 12/29/2018    DOE- several years    Sleep apnea 12/29/2018    doesn't wear CPAP     Thyroid disease     Type 2 diabetes mellitus (CMS HCC)     Diagnosed 06/2021, Hgba1c- ?    Wears glasses          OSA diagnosed? yes  History of Anticoagulant Use? No    Social History:   Social History     Tobacco Use    Smoking status: Former     Packs/day: 1.00     Years: 25.00     Pack years: 25.00     Types: Cigarettes  Quit date: 08/06/2020     Years since quitting: 1.3    Smokeless tobacco: Never    Tobacco comments:     States a few cigarettes over the last few [place]    Vaping Use    Vaping Use: Never used   Substance Use Topics    Alcohol use: Not Currently     Comment: twice/month    Drug use: Not Currently       ROS: Constitutional: negative for recent illnesses   Ears, nose, mouth, throat, and face: negative neck stiffness  Respiratory: negative for shortness of breath  Cardiovascular: negative for chest pain and dyspnea  Gastrointestinal: negative for nausea, vomiting  Integument/breast: negative for rash and skin lesion  Hematologic/lymphatic: negative for easy bruising and bleeding  Musculoskeletal: negative for neck pain  Neurological: negative for numbness and tingling  Allergy:   Allergies   Allergen Reactions    Macrodantin [Nitrofurantoin] Shortness of Breath    Macrobid [Nitrofurantoin Monohyd/M-Cryst]     Morphine Swelling    Zofran [Ondansetron] Nausea/ Vomiting         Exam:   Temperature: 36.5 C (97.7 F)  Heart Rate: 74  BP (Non-Invasive): 128/74  Respiratory Rate: 19  SpO2: 99 %  General: appears in good health  HENT:  Head atraumatic. normocephalic, ENT without erythema or injection, mucous membranes moist.  Neck: supple, symmetrical, trachea midline.  Lungs: Non-labored breathing.  Cardiovascular: regular rate and rhythm. Appears well perfused.   Extremities: extremities normal, atraumatic, no cyanosis or edema  Skin: No rashes or lesions  Neurologic: Alert and oriented x3. No gross motor or sensory deficits in bilateral extremities.  Psychiatric: Normal affect, behavior, speech      Labs:  I have reviewed all lab results.            I discussed treatment plan with patient including risks and benefits of procedures and patient consents.       Tresa Moore, DO   12/06/2021 08:25  Department of Anesthesiology  [facility]esthesia Pager [phone]        I saw and examined the patient.  I reviewed the resident's note.  I agree with the findings and plan of care as documented in the resident's note.  Any exceptions/additions are edited/noted.    Neoma Laming, MD

## 2021-12-06 NOTE — Anesthesia Procedure Notes (Addendum)
Jacqlyn Larsen Lemarr    Block: Peripheral Block    Performed By:   Emmit Pomfret Provider:  Johnna Acosta, MD  Performing Provider:  Lavena Stanford, DO  I was present and supervised/observed the entire procedure.  Johnna Acosta, MD 12/06/2021, 09:04   Sedation    Sedation Start Time  12/06/2021 8:12 AM   Sedation Stop Time 12/06/2021 8:21 AM  Blocks  Right Adductor femoral  Type of Block: single shot  Ultrasound used for needle placement, imaging, supervision, and interpretation  Image:  images available in PACS  Diagnosis: foot pain   Indication: Requested by surgeon and Acute post operative pain   Pt location: at Bedside    Site verified, H&P updated and consent obtained, Patient monitors applied, Timeout performed, Emergency drugs and equipment available, Patient positioned and anesthesia consent given  Technique(See MAR for doses)       Preprocedure hand washing was performed sterile field maintained    Nerve stimulator used :with twitch faded at the current intensity of 0.46mA  Sterile Skin Prep : Supplies assembled on sterile field, Sterile gloves, Sterile technique, Hand hygiene performed, Sterile field established and Mask    Skin prepped with: Chlorhexidine gluconate and isopropyl alcohol    Skin Local      Needle  Needle type: Stimuplex     Needle Gauge: 21G   Needle length: 4 in.  Needle localization: anatomical landmarks and ultrasound guidance  Number of attempts: 1      Catheter          Site      Medications    Assessment  Injection assessment: incremental injection, local visualized surrounding nerve on ultrasound and negative aspiration for heme  Paresthesia pain: none    Heart Rate changed: No    Events     Patient tolerance of procedure: tolerated well, no immediate complications        NOTES: Continuous ultrasound was used to guide the needle tip in close proximity of the neural target.  There was minimal disruption of musculoskeletal and vascular structures with exceptions as noted.  Appropriate  hydrodissection was achieved and no intraneural injection was appreciated/occurred.  CPT Dulce, DO 12/06/2021  [facility] Anesthesiology Service  Pager [phone]  [phone]

## 2021-12-06 NOTE — H&P (Signed)
[name]  H & P UPDATE                                                      Courtney [name], Courtney [name], 56 y.o. female  Date of Admission:  12/06/2021  Date of Birth:  1965-12-07    Date:  12/06/2021    STOP: IF H&P IS GREATER THAN 30 DAYS FROM SURGICAL DAY COMPLETE NEW H&P IS REQUIRED.    Outpatient Pre-Surgical H & P updated the day of the procedure.  1.  H&P the patient has been examined, and no change has occured in the patients condition since the H&P was completed.  (Dr Manson Passey, 11/14/21)      Change in medications: No      Last Menstrual Period: N/A      Comments:     2.  Patient continues to be appropiate candidate for planned surgical procedure. YES    Victorino Sparrow, DPM  Asst Professor - Dept of Orthopaedics  Podiatry  Pager [phone]

## 2021-12-06 NOTE — Anesthesia Transfer of Care (Signed)
ANESTHESIA TRANSFER OF CARE   Courtney [name] is a 56 y.o. ,female, Weight: 84.2 kg (185 lb 10 oz)   had Procedure(s) with comments:  ARTHROPLASTY HAMMERTOE CORRECTION - 2-4 w/ temp k-wire  ARTHRODESIS FUSION METATARSOPHALANGEAL JOINT - hallux IPJ 2-5  PARTIAL SESAMOIDECTOMY FOOT  performed  12/06/21   Primary Service: Victorino Sparrow, DPM    Past Medical History:   Diagnosis Date   . Anemia 12/29/2018    iron Deficiency    . Arthritis    . Asthma 12/29/2018    childhood, no inhalers since   . Back problem    . Constipation    . Depression 12/29/2018    Denies SI/HI    . Disorder of liver     Fatty Liver   . Eczema 12/29/2018    hands x 1 year- eczema- seen derm   . Esophageal reflux 12/29/2018    controlled   . Fibromyalgia    . Headache    . Heart murmur 12/29/2018    Reports she was told this in her 49s   . Hx of transfusion 12/29/2018    denies rx   . Hyperlipidemia    . Hypertension    . Hypothyroid    . Nausea with vomiting    . Peripheral edema 12/29/2018    chronic left lower extremity swelling    . Peripheral neuropathy    . Peripheral vascular disease (CMS HCC)     left lower extremity    . PONV (postoperative nausea and vomiting)    . Pre-diabetes    . Problems with swallowing    . Shortness of breath 12/29/2018    DOE- several years   . Sleep apnea 12/29/2018    doesn't wear CPAP    . Thyroid disease    . Type 2 diabetes mellitus (CMS HCC)     Diagnosed 06/2021, Hgba1c- ?   Marland Kitchen Wears glasses       Allergy History as of 12/06/21     MORPHINE       Noted Status Severity Type Reaction    06/08/17 1506 Rhodia Albright, RN 08/30/14 Active Low  Swelling    08/30/14 1056 Faith Rogue 08/30/14 Active             NITROFURANTOIN       Noted Status Severity Type Reaction    06/08/17 1506 Rhodia Albright, RN 08/30/14 Active High  Shortness of Breath    08/30/14 1056 Faith Rogue 08/30/14 Active             OTHER       Noted Status Severity Type Reaction    12/29/18 1649 Alderson, [place], RN 06/08/17  Deleted       Comments: Patient states an anti nausea medication caused her to "keep throwing up" after receiving it. She doesn't remember the name of this medications but states "Zofran sounds familiar"      06/08/17 1515 Rhodia Albright, RN 06/08/17 Active       Comments: Patient states an anti nausea medication caused her to "keep throwing up" after receiving it. She doesn't remember the name of this medications but states "Zofran sounds familiar"            ONDANSETRON       Noted Status Severity Type Reaction    12/29/18 1648 Alderson, [place], RN 12/29/18 Active Low  Nausea/ Vomiting          NITROFURANTOIN MONOHYD/M-CRYST       Noted Status  Severity Type Reaction    05/01/21 0950 Felipe Drone 05/01/21 Active                 I completed my transfer of care / handoff to the receiving personnel during which we discussed:  Access, Airway, All key/critical aspects of case discussed, Analgesia, Antibiotics, Expectation of post procedure, Fluids/Product, Gave opportunity for questions and acknowledgement of understanding, Labs and PMHx      Post Location: Phase II                        Additional Info:Transferred to phase two PACU. Operative information relayed to PACU RN. AQA. VSS.                                    Last OR Temp: Temperature: 36.5 C (97.7 F)  ABG:  POTASSIUM   Date Value Ref Range Status   12/29/2018 3.6 3.5 - 5.1 mmol/L Final     CALCIUM   Date Value Ref Range Status   12/29/2018 9.2 8.5 - 10.2 mg/dL Final     Calculated P Axis   Date Value Ref Range Status   12/29/2018 53 degrees Final     Calculated R Axis   Date Value Ref Range Status   12/29/2018 39 degrees Final     Calculated T Axis   Date Value Ref Range Status   12/29/2018 53 degrees Final     Airway:* No LDAs found *  Blood pressure 123/81, pulse 74, temperature 36.5 C (97.7 F), resp. rate 12, height 1.626 m (5\' 4" ), weight 84.2 kg (185 lb 10 oz), SpO2 98 %.

## 2021-12-06 NOTE — Anesthesia Postprocedure Evaluation (Signed)
Anesthesia Post Op Evaluation    Patient: Courtney [name]  Procedure(s) with comments:  ARTHROPLASTY HAMMERTOE CORRECTION - 2-4 w/ temp k-wire  ARTHRODESIS FUSION METATARSOPHALANGEAL JOINT - hallux IPJ 2-5  PARTIAL SESAMOIDECTOMY FOOT    Last Vitals:Temperature: 36.2 C (97.2 F) (12/06/21 1315)  Heart Rate: 73 (12/06/21 1345)  BP (Non-Invasive): (!) 140/82 (12/06/21 1345)  Respiratory Rate: (!) 25 (12/06/21 1345)  SpO2: 97 % (12/06/21 1345)    No notable events documented.    Patient is sufficiently recovered from the effects of anesthesia to participate in the evaluation and has returned to their pre-procedure level.  Patient location during evaluation: PACU       Patient participation: complete - patient participated  Level of consciousness: awake and alert and responsive to verbal stimuli    Pain management: adequate  Airway patency: patent    Anesthetic complications: no  Cardiovascular status: acceptable  Respiratory status: acceptable  Hydration status: acceptable  Patient post-procedure temperature: Pt Normothermic   PONV Status: Absent

## 2021-12-06 NOTE — Progress Notes (Signed)
[facility]  Discharge Day Note    Courtney [name]  Date of service: 12/06/2021  Date of Admission:  12/06/2021  Hospital Day:  LOS: 0 days       Examination at discharge:  Vital Signs:  Temperature: 36.5 C (97.7 F) (12/06/21 0707)  Heart Rate: 74 (12/06/21 0832)  BP (Non-Invasive): 123/81 (12/06/21 4174)  Respiratory Rate: 12 (12/06/21 0832)  SpO2: 98 % (12/06/21 0832)  Constitutional: appears in good health  Eyes: Conjunctiva clear.  ENT: ENMT without erythema or injection, mucous membranes moist.  Neck: supple, symmetrical, trachea midline  Respiratory: Thoracic excursion  Normal  Gastrointestinal: non-distended  Genitourinary: Deferred  Musculoskeletal: Head atraumatic and normocephalic.  Right foot dressed and with immediate capillary refill to all toes  Integumentary:  Skin warm and dry  Neurologic: Grossly normal  Lymphatic/Immunologic/Hematologic: No lymphadenopathy  Psychiatric: Normal        Assessment/ Plan:   There are no active hospital problems to display for this patient.    Patient is s/p Right foot partial sesamoid resection (exostectomy), hallux IPJ fusion, digits 2-5 arthroplasty with temporary K-wire fixation 2-4    Disposition : Home discharge    - Cam boot placed  - Post op instructions under media  - Post op appts scheduled        Victorino Sparrow, DPM    Victorino Sparrow, DPM  Asst Professor - Dept of Orthopaedics  Podiatry  Pager [phone]

## 2021-12-13 ENCOUNTER — Ambulatory Visit: Payer: 59 | Attending: Podiatrist | Admitting: Podiatrist

## 2021-12-13 ENCOUNTER — Other Ambulatory Visit: Payer: Self-pay

## 2021-12-13 ENCOUNTER — Encounter (INDEPENDENT_AMBULATORY_CARE_PROVIDER_SITE_OTHER): Payer: Self-pay | Admitting: Podiatrist

## 2021-12-13 DIAGNOSIS — E119 Type 2 diabetes mellitus without complications: Secondary | ICD-10-CM | POA: Insufficient documentation

## 2021-12-13 DIAGNOSIS — Z8739 Personal history of other diseases of the musculoskeletal system and connective tissue: Secondary | ICD-10-CM | POA: Insufficient documentation

## 2021-12-13 DIAGNOSIS — Z9889 Other specified postprocedural states: Secondary | ICD-10-CM | POA: Insufficient documentation

## 2021-12-13 DIAGNOSIS — M205X2 Other deformities of toe(s) (acquired), left foot: Secondary | ICD-10-CM | POA: Insufficient documentation

## 2021-12-13 DIAGNOSIS — M79671 Pain in right foot: Secondary | ICD-10-CM | POA: Insufficient documentation

## 2021-12-13 DIAGNOSIS — M2040 Other hammer toe(s) (acquired), unspecified foot: Secondary | ICD-10-CM | POA: Insufficient documentation

## 2021-12-13 DIAGNOSIS — L84 Corns and callosities: Secondary | ICD-10-CM | POA: Insufficient documentation

## 2021-12-13 NOTE — Progress Notes (Signed)
PODIATRY CLINIC  DEPARTMENT OF ORTHOPAEDICS  [facility]    Physician Office Center  [address]  [place], [place] [address]    PATIENT NAME:  Courtney [name]  DATE OF BIRTH:  01-12-1966  MRN:  [id number]  DATE OF SERVICE:  12/13/2021     HISTORY OF PRESENT ILLNESS:   56 y.o. Non-Hispanic female presents for postoperative follow-up s/p right foot exostectomy of prominent sesamoid bones, hallux IPJ fusion with screw fixation, arthroplasty with temporary K-wire fixation digits 2, 3, 4 and 5th digit derotational arthroplasty; DOS 12/06/2021.  Patient states overall she is doing very well on states that her pain has been minimal currently rated 3/10.  Has continued use of CAM boot with minimal weight-bearing.  Also continues use of bone stimulator.  Past surgical hx includes lesser digit hammertoe repair to toes 2 -4 in the 1990's. 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  She is a former smoker.  She presents to clinic today with her spouse.     REVIEW OF SYSTEMS:   Denies fevers, chills, nausea, vomiting, and shortness of breath.  All other systems reviewed and are negative, except as reported in HPI.      PHYSICAL EXAM:   General:  Appears stated age.  Well-appearing.    Psychiatric:  Pleasant.  Cooperative.  Alert and oriented x 3.    Respiratory:  Lung expansion symmetric.  No active work of breathing.    Heme/Lymphatic:  No lymphadenopathy.    ENMT:  Sclera clear.  Trachea midline.  Mucous membranes moist.    Vascular:  +2/4 dorsalis pedis and posterior tibial pulses, bilaterally.  Capillary fill time was less than 3 seconds, bilaterally.  Skin temperature was warm at the level of the toes, bilaterally.  There was no edema noted.   Intact hair growth noted to bilateral tibia.  Skin was of good turgor and texture.    Neurologic:  Gross sensation intact to touch, bilaterally.  No paresthesias noted, bilateral  feet. No numbness noted.  Dermatologic: Right foot multiple surgical incisions with sutures intact, no gapping, no active discharge.  Temporary K-wire to lesser digits also noted to appears stable.  Musculoskeletal:  Left foot hammertoe deformities to left 3 lesser toes; partially reducible. Left 2nd digit noted overlapping 3rd digit and in abducted position.   +5/5 dorsiflexion, plantarflexion, inversion, eversion, intrinsic muscle strength, bilaterally.  Full range of motion, all quadrants, bilaterally.  No pain with range of motion.  No crepitus noted.          ASSESSMENT:      1. s/p Right foot exostectomy of prominent sesamoid bones, hallux IPJ fusion with screw fixation, arthroplasty with temporary K-wire fixation digits 2, 3, 4 and 5th digit derotational arthroplasty; DOS 12/06/2021.  Surgical site appears stable with no acute signs of infection  2.  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 Left foot 2nd digit hammertoe abducted and overlapping 3rd digit with pain; failed conservative treatment.   4. DM 2 (HgbA1c 5.7 12/29/2018)      PLAN:    - Discussed intraoperative findings and answered all questions.    - Surgical dressings changed   - Advised to continue to keep surgical site clean, dry and covered, continue RICE prn  - Okay to weightbear with heel touch only for transfers  - Off work note extended to June  26  - Will likely plan for suture removal at next visit  - RTC 1 week      This note may have been partially generated using MModal Fluency Direct system and there may be some incorrect words, spellings, and punctuation that were not noted in checking the note before saving.        Samara Deist Herschel Senegal  Podiatry, Assistant Professor  Department of Orthopaedics  [facility]  Pager [phone]

## 2021-12-17 ENCOUNTER — Encounter (INDEPENDENT_AMBULATORY_CARE_PROVIDER_SITE_OTHER): Payer: Self-pay | Admitting: Podiatrist

## 2021-12-20 ENCOUNTER — Encounter (INDEPENDENT_AMBULATORY_CARE_PROVIDER_SITE_OTHER): Payer: Self-pay | Admitting: Podiatrist

## 2021-12-20 ENCOUNTER — Ambulatory Visit: Payer: 59 | Attending: Podiatrist | Admitting: Podiatrist

## 2021-12-20 ENCOUNTER — Other Ambulatory Visit: Payer: Self-pay

## 2021-12-20 DIAGNOSIS — M205X2 Other deformities of toe(s) (acquired), left foot: Secondary | ICD-10-CM | POA: Insufficient documentation

## 2021-12-20 DIAGNOSIS — Z4789 Encounter for other orthopedic aftercare: Secondary | ICD-10-CM

## 2021-12-20 DIAGNOSIS — M2040 Other hammer toe(s) (acquired), unspecified foot: Secondary | ICD-10-CM | POA: Insufficient documentation

## 2021-12-20 DIAGNOSIS — M79675 Pain in left toe(s): Secondary | ICD-10-CM | POA: Insufficient documentation

## 2021-12-20 DIAGNOSIS — Z8739 Personal history of other diseases of the musculoskeletal system and connective tissue: Secondary | ICD-10-CM | POA: Insufficient documentation

## 2021-12-20 DIAGNOSIS — M79672 Pain in left foot: Secondary | ICD-10-CM | POA: Insufficient documentation

## 2021-12-20 DIAGNOSIS — Z9889 Other specified postprocedural states: Secondary | ICD-10-CM | POA: Insufficient documentation

## 2021-12-20 DIAGNOSIS — M79671 Pain in right foot: Secondary | ICD-10-CM | POA: Insufficient documentation

## 2021-12-20 DIAGNOSIS — E119 Type 2 diabetes mellitus without complications: Secondary | ICD-10-CM | POA: Insufficient documentation

## 2021-12-20 DIAGNOSIS — G8929 Other chronic pain: Secondary | ICD-10-CM | POA: Insufficient documentation

## 2021-12-20 DIAGNOSIS — L84 Corns and callosities: Secondary | ICD-10-CM | POA: Insufficient documentation

## 2021-12-20 NOTE — Nursing Note (Signed)
[facility] HEALTH  CARE  PHYSICIAN OFFICE [place], [place] OFFICE CENTER  [address]  [place] [address]  [phone]  [phone]    Patient Name: Courtney [name]  MRN: [id number]  Date: 12/20/2021    Orthopaedic Faculty: Victorino Sparrow, DPM    Xeroform and a kelly wrap was applied to the right LE. Instructions were given on the application and care of the dressing.  Instructions were given on proper skin care, signs and symptoms of skin problems and/or infection.  The patient was instructed to call the Orthopaedic Department with any questions or problems.  Emergency numbers were also given.  The patient verbalized understanding of the above.    Person applying the device: Jorene Guest, Ambulatory Care Assistant 12/20/2021, 14:14    Jorene Guest, Ambulatory Care Assistant

## 2021-12-20 NOTE — Progress Notes (Signed)
PODIATRY CLINIC  DEPARTMENT OF ORTHOPAEDICS  [facility]    Physician Office Center  [address]  [place], [place] [address]    PATIENT NAME:  Courtney [name]  DATE OF BIRTH:  1965-09-10  MRN:  [id number]  DATE OF SERVICE:  12/20/2021     HISTORY OF PRESENT ILLNESS:   56 y.o. Non-Hispanic female presents for postoperative follow-up s/p right foot exostectomy of prominent sesamoid bones, hallux IPJ fusion with screw fixation, arthroplasty with temporary K-wire fixation digits 2, 3, 4 and 5th digit derotational arthroplasty; DOS 12/06/2021.  Patient states overall she is doing very well on states that her pain has been minimal currently rated 3/10.  Has continued use of CAM boot with minimal weight-bearing.  Also continues use of bone stimulator.  Past surgical hx includes lesser digit hammertoe repair to toes 2 -4 in the 1990's. 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  She is a former smoker.  She presents to clinic today with her spouse.     REVIEW OF SYSTEMS:   Denies fevers, chills, nausea, vomiting, and shortness of breath.  All other systems reviewed and are negative, except as reported in HPI.      PHYSICAL EXAM:   General:  Appears stated age.  Well-appearing.    Psychiatric:  Pleasant.  Cooperative.  Alert and oriented x 3.    Respiratory:  Lung expansion symmetric.  No active work of breathing.    Heme/Lymphatic:  No lymphadenopathy.    ENMT:  Sclera clear.  Trachea midline.  Mucous membranes moist.    Vascular:  +2/4 dorsalis pedis and posterior tibial pulses, bilaterally.  Capillary fill time was less than 3 seconds, bilaterally.  Skin temperature was warm at the level of the toes, bilaterally.  There was no edema noted.   Intact hair growth noted to bilateral tibia.  Skin was of good turgor and texture.    Neurologic:  Gross sensation intact to touch, bilaterally.  No paresthesias noted, bilateral  feet. No numbness noted.  Dermatologic: Right foot multiple surgical incisions with s/p sutures removed, no gapping, no active discharge.  Temporary K-wire to lesser digits s/p removed with no acute signs of infection.   Musculoskeletal:  Left foot hammertoe deformities to left 3 lesser toes; partially reducible. Left 2nd digit noted overlapping 3rd digit and in abducted position.  +5/5 dorsiflexion, plantarflexion, inversion, eversion, intrinsic muscle strength, bilaterally.  Full range of motion, all quadrants, bilaterally.  No pain with range of motion.  No crepitus noted.                    ASSESSMENT:      1. Right foot exostectomy of prominent sesamoid bones, hallux IPJ fusion with screw fixation, arthroplasty with temporary K-wire fixation digits 2, 3, 4 and 5th digit derotational arthroplasty; DOS 12/06/2021.  Surgical site appears stable, healing well and with no acute signs of infection  2.  Hx of Right  2nd -4th digit hammertoe surgery with recurrence  3. Left foot 2nd digit hammertoe abducted and overlapping 3rd digit with pain; failed conservative treatment.   4. DM 2 (HgbA1c 5.7 12/29/2018)      PLAN:    - Discussed intraoperative findings and answered all questions.    - Sutures and K-wire removed and surgical site cleaned in clinic today.  - Discussed that pt is OK to shower with foot uncovered  in 24 hours.  - Continue use of bone stimulator and CAM boot.  - Continue to RICE prn.  - Continue Darco toe splints   - Off work note extended until next visit.  - Will plan x-ray at next visit  - RTC 01/09/2022.      This note may have been partially generated using MModal Fluency Direct system and there may be some incorrect words, spellings, and punctuation that were not noted in checking the note before saving.    I am scribing for, and in the presence of, Victorino Sparrow, DPM, for services provided on 12/20/2021.  Rosaland Lao, SCRIBE    I personally performed the services described in this documentation, as  scribed  in my presence, and it is both accurate  and complete.    Victorino Sparrow, DPM      Samara Deist Herschel [place]  Podiatry, Assistant Professor  Department of Orthopaedics  [facility]  Pager [phone]

## 2022-01-02 ENCOUNTER — Ambulatory Visit (INDEPENDENT_AMBULATORY_CARE_PROVIDER_SITE_OTHER): Payer: Self-pay | Admitting: Podiatrist

## 2022-01-03 ENCOUNTER — Ambulatory Visit (INDEPENDENT_AMBULATORY_CARE_PROVIDER_SITE_OTHER): Payer: Self-pay | Admitting: Podiatrist

## 2022-01-09 ENCOUNTER — Encounter (INDEPENDENT_AMBULATORY_CARE_PROVIDER_SITE_OTHER): Payer: Self-pay | Admitting: Podiatrist

## 2022-01-09 ENCOUNTER — Other Ambulatory Visit: Payer: Self-pay

## 2022-01-09 ENCOUNTER — Inpatient Hospital Stay (HOSPITAL_BASED_OUTPATIENT_CLINIC_OR_DEPARTMENT_OTHER)
Admission: RE | Admit: 2022-01-09 | Discharge: 2022-01-09 | Disposition: A | Discharge status: Home / Self Care | Payer: 59 | Source: Ambulatory Visit

## 2022-01-09 ENCOUNTER — Ambulatory Visit: Payer: 59 | Attending: Podiatrist | Admitting: Podiatrist

## 2022-01-09 ENCOUNTER — Other Ambulatory Visit (INDEPENDENT_AMBULATORY_CARE_PROVIDER_SITE_OTHER): Payer: Self-pay | Admitting: Podiatrist

## 2022-01-09 DIAGNOSIS — Z9889 Other specified postprocedural states: Secondary | ICD-10-CM

## 2022-01-09 DIAGNOSIS — L84 Corns and callosities: Secondary | ICD-10-CM | POA: Insufficient documentation

## 2022-01-09 DIAGNOSIS — M79671 Pain in right foot: Secondary | ICD-10-CM | POA: Insufficient documentation

## 2022-01-09 DIAGNOSIS — Z8739 Personal history of other diseases of the musculoskeletal system and connective tissue: Secondary | ICD-10-CM | POA: Insufficient documentation

## 2022-01-09 DIAGNOSIS — Z4789 Encounter for other orthopedic aftercare: Secondary | ICD-10-CM

## 2022-01-09 DIAGNOSIS — M2040 Other hammer toe(s) (acquired), unspecified foot: Secondary | ICD-10-CM | POA: Insufficient documentation

## 2022-01-09 DIAGNOSIS — E119 Type 2 diabetes mellitus without complications: Secondary | ICD-10-CM | POA: Insufficient documentation

## 2022-01-09 DIAGNOSIS — M205X2 Other deformities of toe(s) (acquired), left foot: Secondary | ICD-10-CM | POA: Insufficient documentation

## 2022-01-09 NOTE — Progress Notes (Signed)
PODIATRY CLINIC  DEPARTMENT OF ORTHOPAEDICS  [facility]    Physician Office Center  [address]  [place], [place] [address]      PATIENT NAME:  Courtney [name]  DATE OF BIRTH:  Mar 17, 1966  MRN:  [id number]  DATE OF SERVICE:  01/09/2022     HISTORY OF PRESENT ILLNESS:   56 y.o. Non-Hispanic female presents for postoperative follow-up s/p right foot exostectomy of prominent sesamoid bones, hallux IPJ fusion with screw fixation, arthroplasty with temporary K-wire fixation digits 2, 3, 4 and 5th digit derotational arthroplasty; DOS 12/06/2021.  Patient states overall she is doing very well and reports 0/10 pain.  Has continued use of CAM boot with minimal weight-bearing.  Also continues use of bone stimulator. Past surgical hx includes lesser digit hammertoe repair to toes 2 -4 in the 1990's. 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  She is a former smoker.  She presents to clinic today with her spouse.     REVIEW OF SYSTEMS:   Denies fevers, chills, nausea, vomiting, and shortness of breath.  All other systems reviewed and are negative, except as reported in HPI.      PHYSICAL EXAM:   General:  Appears stated age.  Well-appearing.    Psychiatric:  Pleasant.  Cooperative.  Alert and oriented x 3.    Respiratory:  Lung expansion symmetric.  No active work of breathing.    Heme/Lymphatic:  No lymphadenopathy.    ENMT:  Sclera clear.  Trachea midline.  Mucous membranes moist.    Vascular:  +2/4 dorsalis pedis and posterior tibial pulses, bilaterally.  Capillary fill time was less than 3 seconds, bilaterally.  Skin temperature was warm at the level of the toes, bilaterally.  There was no edema noted.   Intact hair growth noted to bilateral tibia.  Skin was of good turgor and texture.    Neurologic:  Gross sensation intact to touch, bilaterally.  No paresthesias noted, bilateral feet. No numbness noted.  Dermatologic:  Right foot multiple surgical incisions healing well, no discoloration or erythema.    Musculoskeletal:  Left foot hammertoe deformities to left 3 lesser toes; partially reducible. Left 2nd digit noted overlapping 3rd digit and in abducted position.  +5/5 dorsiflexion, plantarflexion, inversion, eversion, intrinsic muscle strength, bilaterally.  Full range of motion, all quadrants, bilaterally.  No pain with range of motion.  No crepitus noted.                    RADIOGRAPHS:  R foot weightbearing XR, 01/09/2022, personally reviewed and interpreted myself:    Stable surgical changes with unchanged alignment      ASSESSMENT:      1. Right foot exostectomy of prominent sesamoid bones, hallux IPJ fusion with screw fixation, arthroplasty with temporary K-wire fixation digits 2, 3, 4 and 5th digit derotational arthroplasty; DOS 12/06/2021.  Surgical site appears stable, healing well and with no acute signs of infection  2.  Hx of Right  2nd -4th digit hammertoe surgery with recurrence  3. Left foot 2nd digit hammertoe abducted and overlapping 3rd digit with pain; failed conservative treatment.   4. DM 2 (HgbA1c 5.7 12/29/2018)      PLAN:    - Discussed clinical exam and radiological findings and answered all questions.    - Continue Darco toe splints.  - Dispensed toe trainer in clinic today as additional option   -  Discussed pt is OK to segue out of CAM boot and into supportive footwear over a period of 1 week.  - Prescription given for physical therapy.  - Return to work note given for activity modification.   - RTC after completion of physical therapy.       This note may have been partially generated using MModal Fluency Direct system and there may be some incorrect words, spellings, and punctuation that were not noted in checking the note before saving.    I am scribing for, and in the presence of, Victorino Sparrow, DPM, for services provided on 01/09/2022.  Rosaland Lao, SCRIBE    I personally performed the services  described in this documentation, as scribed  in my presence, and it is both accurate  and complete.    Victorino Sparrow, DPM        Samara Deist Herschel [place]  Podiatry, Assistant Professor  Department of Orthopaedics  [facility]  Pager [phone]

## 2022-01-16 DIAGNOSIS — Z9889 Other specified postprocedural states: Secondary | ICD-10-CM

## 2022-01-16 DIAGNOSIS — M2041 Other hammer toe(s) (acquired), right foot: Secondary | ICD-10-CM

## 2022-02-10 ENCOUNTER — Encounter (INDEPENDENT_AMBULATORY_CARE_PROVIDER_SITE_OTHER): Payer: Self-pay | Admitting: Specialist

## 2022-02-10 ENCOUNTER — Other Ambulatory Visit: Payer: Self-pay

## 2022-02-10 ENCOUNTER — Ambulatory Visit: Payer: 59 | Attending: Specialist | Admitting: Specialist

## 2022-02-10 VITALS — BP 130/68 | HR 97 | Temp 98.2°F | Ht 63.0 in | Wt 180.1 lb

## 2022-02-10 DIAGNOSIS — Z981 Arthrodesis status: Secondary | ICD-10-CM

## 2022-02-10 DIAGNOSIS — G43909 Migraine, unspecified, not intractable, without status migrainosus: Secondary | ICD-10-CM | POA: Insufficient documentation

## 2022-02-10 DIAGNOSIS — G43809 Other migraine, not intractable, without status migrainosus: Secondary | ICD-10-CM

## 2022-02-10 MED ORDER — TOPIRAMATE 25 MG TABLET
ORAL_TABLET | ORAL | 0 refills | Status: DC
Start: 2022-02-10 — End: 2022-10-08

## 2022-02-10 MED ORDER — TOPIRAMATE 100 MG TABLET
100.0000 mg | ORAL_TABLET | Freq: Every evening | ORAL | 5 refills | Status: DC
Start: 2022-02-10 — End: 2022-10-08

## 2022-02-10 MED ORDER — SUMATRIPTAN 100 MG TABLET: ORAL_TABLET | ORAL | 5 refills | Status: DC

## 2022-02-10 NOTE — Patient Instructions (Addendum)
Topamax Instructions:  25 mg for week 1 nightly   50 mg for week 2 nightly  75 mg for week 3 nightly    After week 3, take 100 mg nightly

## 2022-02-10 NOTE — Addendum Note (Signed)
Addended by: Marchia Bond on: 02/10/2022 03:48 PM     Modules accepted: Level of Service

## 2022-02-10 NOTE — Progress Notes (Signed)
RE:  Courtney [name]          1966/01/13    Dear Courtney Lucks, MD:    I had the pleasure of seeing Courtney [name] in follow-up at the [facility] on 02/10/2022.  Today she reports that her headaches have not changed.  She is currently taking Excedrin migraine 2 capsules for prevention and using as needed for her headaches. Cannot take ibuprofen according to her GI doctor.     Patient reports having headaches for the past 30 years. The past 2 years ago, patient started getting a headache at 2 am that will last for 15 minutes to 3 hours at a time. She will take 2 Excedrin migraine capsules and drink coke which aborts the headache. Admits to photophobia and phonophobia. Denies past history of head and neck trauma. 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 Patient snores but uses a CPAP at night. Took Imitrex in the 90's that helped.     Saw Dr. Quentin Mulling in the past for neuropathy.     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     Had two previous back surgeries for fusion.     Severe Headaches: 4 per month  Total Headaches: 12 per month  Headache Free Days: 18 per month  Headache Description: throbbing pain, dull pain. Concentrated towards the occipital region and other times will feel pressure behind the left orbit and left temporal region. Patient will also experience bilateral blurriness of both eyes. Will also have nausea.  Rated as 5-7/10.  Response to abortive medications: Good  Side effects to medications: N/A    Results of previous testing: N/A    Current Outpatient Medications   Medication Sig Dispense Refill    ascorbic acid, vitamin C, (VITAMIN C) 500 mg Oral Tablet Take 1 Tablet (500 mg total) by mouth Once a day      aspirin/acetaminophen/caffeine (EXCEDRIN MIGRAINE ORAL) Take by mouth      BIOTIN ORAL Take by mouth Takes 3 times weekly      calcium citrate-vitamin D3 (CITRACAL) 200 mg calcium -250 unit Oral Tablet Take by mouth Once a day       Calcium-Cholecalciferol, D3, 600 mg-10 mcg (400 unit) Oral Tablet, Chewable 1 Tablet (Patient not taking: Reported on 10/14/2021)      Cholecalciferol, Vitamin D3, 25 mcg (1,000 unit) Oral Capsule Take by mouth (Patient not taking: Reported on 10/14/2021)      cyanocobalamin (VITAMIN B 12) 1,000 mcg Oral Tablet Take by mouth Once a day       ergocalciferol, vitamin D2, (DRISDOL) 1,250 mcg (50,000 unit) Oral Capsule ergocalciferol (vitamin D2) 1,250 mcg (50,000 unit) capsule      ferrous sulfate (FERATAB) 324 mg (65 mg iron) Oral Tablet, Delayed Release (E.C.) Take 1 Tablet (324 mg total) by mouth Takes a couple times per week      fluocinolone (SYNALAR) 0.025 % Cream by Apply Topically route Twice daily      furosemide (LASIX) 20 mg Oral Tablet furosemide 20 mg tablet   TAKE 1 TABLET BY MOUTH EVERY DAY      Levothyroxine 75 mcg Oral Capsule Every one hour      loratadine (CLARITIN) 10 mg Oral Tablet Take 1 Tablet (10 mg total) by mouth Every evening      losartan (COZAAR) 50 mg Oral Tablet Take 2 Tablets (100 mg total) by mouth Once a day      magnesium oxide (MAG-OX)  400 mg (241.3 mg magnesium) Oral Tablet Take 1 Tablet (400 mg total) by mouth Once a day Takes 3 times per week      MOUNJARO 2.5 mg/0.5 mL Subcutaneous Pen Injector Fridays (Night) (Patient not taking: Reported on 02/10/2022)      MOUNJARO 7.5 mg/0.5 mL Subcutaneous Pen Injector       naloxone (NARCAN) 4 mg per spray nasal spray Instill 1 Spray by INTRANASAL route Every 2 minutes as needed for Other (actual or suspected opioid overdose) Call 911 if given. 2 Each 0    pantoprazole (PROTONIX) 40 mg Oral Tablet, Delayed Release (E.C.) Take 1 Tablet (40 mg total) by mouth Once per day as needed      pimecrolimus (ELIDEL) 1 % Cream APPLY TO AFFECTED AREA TWICE A DAY      POTASSIUM CHLORIDE ORAL Take 20 mEq by mouth Takes 2-3 times weekly      sumatriptan succinate (IMITREX) 100 mg Oral Tablet May repeat in 2 hours in needed 30 Tablet 5    topiramate (TOPAMAX)  100 mg Oral Tablet Take 1 Tablet (100 mg total) by mouth Every night 30 Tablet 5    topiramate (TOPAMAX) 25 mg Oral Tablet 1 qhs x7d, 2 qhs x7d, 3 qhs x7d 42 Tablet 0    triamcinolone acetonide (ARISTOCORT A) 0.1 % Cream triamcinolone acetonide 0.1 % topical cream       No current facility-administered medications for this visit.        Past medical history, family history, and social history have been reviewed and confirmed. Her mother has a history of migraine with aura.     Sleep: no issues with sleep, headache will wake her up early morning  Mood: irritable     Vitals:    02/10/22 1439   BP: 130/68   Pulse: 97   Temp: 36.8 C (98.2 F)   SpO2: 95%   Weight: 81.7 kg (180 lb 1.9 oz)   Height: 1.6 m (5\' 3" )   BMI: 31.97         Her exam reveals she is normocephalic and atraumatic.  Mental status exam shows her to be oriented with good memory, attention, knowledge, and language.  Cranial nerve exam reveals equal and reactive pupils with intact conjugate eye movements, symmetric face, and clear speech, and normal hearing to voice.  Gait is steady and coordination is intact.  All four extremeties have normal strength.      ICD-10-CM    1. Migraine headache  G43.909              - Will begin Topamax 25 mg with weekly increases of 25 mg for a total dosage of 100 mg  - Will begin Sumatriptan 100 mg as needed   - May consider Amitriptyline at the next visit   - Continue to use CPAP at bed time    The patient will follow up with me in 4 months or sooner if needed.    Olegario Messier, DO 02/10/2022 15:40  [place] [facility], Resident, PGY-1  Department of [place]   [address] [place], [place] [address]  Phone: [phone]    On the day of the encounter, a total of 50 minutes was spent on this patient encounter including review of historical information, examination, documentation and post-visit activities.     I saw and examined the patient.  I  reviewed the resident's note.  I agree with the findings  and plan of care as documented in the resident's note.  Any exceptions/additions are edited/noted.    Malena Edman, MD

## 2022-02-24 ENCOUNTER — Encounter (INDEPENDENT_AMBULATORY_CARE_PROVIDER_SITE_OTHER): Payer: Self-pay | Admitting: Podiatrist

## 2022-03-11 ENCOUNTER — Ambulatory Visit (INDEPENDENT_AMBULATORY_CARE_PROVIDER_SITE_OTHER): Payer: Self-pay | Admitting: Podiatrist

## 2022-03-13 ENCOUNTER — Ambulatory Visit: Payer: 59 | Attending: Orthopaedic Surgery | Admitting: Orthopaedic Surgery

## 2022-03-13 ENCOUNTER — Encounter (HOSPITAL_BASED_OUTPATIENT_CLINIC_OR_DEPARTMENT_OTHER): Payer: Self-pay | Admitting: Orthopaedic Surgery

## 2022-03-13 ENCOUNTER — Encounter (HOSPITAL_BASED_OUTPATIENT_CLINIC_OR_DEPARTMENT_OTHER): Payer: Self-pay

## 2022-03-13 ENCOUNTER — Encounter (INDEPENDENT_AMBULATORY_CARE_PROVIDER_SITE_OTHER): Payer: Self-pay | Admitting: Podiatrist

## 2022-03-13 ENCOUNTER — Ambulatory Visit (INDEPENDENT_AMBULATORY_CARE_PROVIDER_SITE_OTHER): Payer: 59 | Admitting: Podiatrist

## 2022-03-13 ENCOUNTER — Other Ambulatory Visit: Payer: Self-pay

## 2022-03-13 ENCOUNTER — Ambulatory Visit (HOSPITAL_BASED_OUTPATIENT_CLINIC_OR_DEPARTMENT_OTHER): Payer: Self-pay | Admitting: Orthopaedic Surgery

## 2022-03-13 VITALS — Temp 97.5°F | Ht 64.25 in | Wt 180.3 lb

## 2022-03-13 DIAGNOSIS — M79674 Pain in right toe(s): Secondary | ICD-10-CM

## 2022-03-13 DIAGNOSIS — L84 Corns and callosities: Secondary | ICD-10-CM

## 2022-03-13 DIAGNOSIS — M79671 Pain in right foot: Secondary | ICD-10-CM | POA: Insufficient documentation

## 2022-03-13 DIAGNOSIS — Z9889 Other specified postprocedural states: Secondary | ICD-10-CM | POA: Insufficient documentation

## 2022-03-13 DIAGNOSIS — M205X2 Other deformities of toe(s) (acquired), left foot: Secondary | ICD-10-CM | POA: Insufficient documentation

## 2022-03-13 DIAGNOSIS — M1711 Unilateral primary osteoarthritis, right knee: Secondary | ICD-10-CM | POA: Insufficient documentation

## 2022-03-13 DIAGNOSIS — M2040 Other hammer toe(s) (acquired), unspecified foot: Secondary | ICD-10-CM | POA: Insufficient documentation

## 2022-03-13 DIAGNOSIS — G8929 Other chronic pain: Secondary | ICD-10-CM

## 2022-03-13 DIAGNOSIS — Z8739 Personal history of other diseases of the musculoskeletal system and connective tissue: Secondary | ICD-10-CM

## 2022-03-13 DIAGNOSIS — E119 Type 2 diabetes mellitus without complications: Secondary | ICD-10-CM

## 2022-03-13 DIAGNOSIS — M79672 Pain in left foot: Secondary | ICD-10-CM | POA: Insufficient documentation

## 2022-03-13 DIAGNOSIS — M79675 Pain in left toe(s): Secondary | ICD-10-CM | POA: Insufficient documentation

## 2022-03-13 DIAGNOSIS — Z981 Arthrodesis status: Secondary | ICD-10-CM

## 2022-03-13 MED ORDER — LIDOCAINE (PF) 10 MG/ML (1 %) INJECTION SOLUTION
4.0000 mL | Freq: Once | INTRAMUSCULAR | Status: AC
Start: 2022-03-13 — End: ?

## 2022-03-13 MED ORDER — METHYLPREDNISOLONE ACETATE 80 MG/ML SUSPENSION FOR INJECTION
80.0000 mg | INTRAMUSCULAR | Status: AC
Start: 2022-03-13 — End: ?

## 2022-03-13 NOTE — Progress Notes (Signed)
PODIATRY CLINIC  DEPARTMENT OF ORTHOPAEDICS  [facility]    Physician Office Center  [address]  [place], [place] [address]      PATIENT NAME:  Courtney [name]  DATE OF BIRTH:  Jul 23, 1965  MRN:  [id number]  DATE OF SERVICE:  03/13/2022     HISTORY OF PRESENT ILLNESS:   56 y.o. Non-Hispanic female presents for postoperative follow-up s/p right foot exostectomy of prominent sesamoid bones, hallux IPJ fusion with screw fixation, arthroplasty with temporary K-wire fixation digits 2, 3, 4 and 5th digit derotational arthroplasty; DOS 12/06/2021.  Patient states overall she is doing very well and reports 0/10 pain. Since last visit, pt has completed physical therapy. Pt states her R hallux nail fell off recently due to nail fungus. Past surgical hx includes lesser digit hammertoe repair to toes 2 -4 in the 1990's. 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  She is a former smoker.  She presents to clinic today with her spouse.     REVIEW OF SYSTEMS:   Denies fevers, chills, nausea, vomiting, and shortness of breath.  All other systems reviewed and are negative, except as reported in HPI.      PHYSICAL EXAM:   General:  Appears stated age.  Well-appearing.    Psychiatric:  Pleasant.  Cooperative.  Alert and oriented x 3.    Respiratory:  Lung expansion symmetric.  No active work of breathing.    Heme/Lymphatic:  No lymphadenopathy.    ENMT:  Sclera clear.  Trachea midline.  Mucous membranes moist.    Vascular:  +2/4 dorsalis pedis and posterior tibial pulses, bilaterally.  Capillary fill time was less than 3 seconds, bilaterally.  Skin temperature was warm at the level of the toes, bilaterally.  There was no edema noted.   Intact hair growth noted to bilateral tibia.  Skin was of good turgor and texture.    Neurologic:  Gross sensation intact to touch, bilaterally.  No paresthesias noted, bilateral feet. No numbness  noted.  Dermatologic: Right foot multiple surgical incisions well healed, no discoloration or erythema.  R hallux nail noted to be absent.  Musculoskeletal:  Left foot hammertoe deformities to left 3 lesser toes; partially reducible. Left 2nd digit noted overlapping 3rd digit and in abducted position.  +5/5 dorsiflexion, plantarflexion, inversion, eversion, intrinsic muscle strength, bilaterally.  Full range of motion, all quadrants, bilaterally.  No pain with range of motion.  No crepitus noted.         RADIOGRAPHS:  None taken today.      ASSESSMENT:      1. Right foot exostectomy of prominent sesamoid bones, hallux IPJ fusion with screw fixation, arthroplasty with temporary K-wire fixation digits 2, 3, 4 and 5th digit derotational arthroplasty; DOS 12/06/2021.  Surgical site appears stable, healing well and with no acute signs of infection  2.  Hx of Right  2nd -4th digit hammertoe surgery with recurrence  3. Left foot 2nd digit hammertoe abducted and overlapping 3rd digit with pain; failed conservative treatment.   4. DM 2 (HgbA1c 5.7 12/29/2018)      PLAN:    - Discussed clinical exam findings and answered all questions.    - Continue toe splints on the L foot prn  - Discussed continued use of supportive footwear.  - Pt will reach out via MyChart when she would like to submit for L foot surgery.   -  RTC prn.      This note may have been partially generated using MModal Fluency Direct system and there may be some incorrect words, spellings, and punctuation that were not noted in checking the note before saving.    I am scribing for, and in the presence of, Victorino Sparrow, DPM, for services provided on 03/13/2022.  Rosaland Lao, SCRIBE    I personally performed the services described in this documentation, as scribed  in my presence, and it is both accurate  and complete.    Victorino Sparrow, DPM     On the day of the encounter, a total of  15 minutes was spent on this patient encounter including review of historical  information, examination, documentation and post-visit activities. The time documented excludes procedural time.         Samara Deist Herschel [place]  Podiatry, Assistant Professor  Department of Orthopaedics  [facility]  Pager [phone]

## 2022-03-13 NOTE — Procedures (Signed)
JOINT REPLACEMENT, [facility] [address]  [place] [address]  Operated by [facility]  Procedure Note    Name: Courtney [name] MRN:  [id number]   Date: 03/13/2022 Age: 56 y.o.  DOB:   Sep 26, 1965       Joint Asp/Inj  Performed by: Gabriel Rung, PA-C  Authorized by: Gabriel Rung, PA-C     Consent:     Consent obtained:  Verbal    Consent given by:  Patient    Risks discussed:  Pain    Alternatives discussed:  Alternative treatment  Universal protocol:     Imaging studies available: yes      Site/side marked: yes    Location:     Location:  Knee    Knee:  R knee  Anesthesia (see MAR for exact dosages):     Anesthesia method:  None  Procedure details:     Needle gauge:  22 G    Ultrasound guidance: no      Approach:  Lateral    Steroid injected: yes      Specimen collected: no    Post-procedure details:     Dressing:  Adhesive bandage    Patient tolerance of procedure:  Tolerated well, no immediate complications  Comments:      An injection was performed using 4 ml of 1% plain Lidocaine and 80   mg/ml of Depo Medrol.       Courtney Riolo L Jailyn Langhorst, PA-C

## 2022-03-18 ENCOUNTER — Ambulatory Visit (INDEPENDENT_AMBULATORY_CARE_PROVIDER_SITE_OTHER): Payer: Self-pay | Admitting: Podiatrist

## 2022-04-14 ENCOUNTER — Ambulatory Visit (INDEPENDENT_AMBULATORY_CARE_PROVIDER_SITE_OTHER): Payer: 59 | Admitting: Orthopaedic Surgery

## 2022-05-05 ENCOUNTER — Ambulatory Visit (INDEPENDENT_AMBULATORY_CARE_PROVIDER_SITE_OTHER): Payer: Self-pay | Admitting: Specialist

## 2022-05-26 ENCOUNTER — Ambulatory Visit (INDEPENDENT_AMBULATORY_CARE_PROVIDER_SITE_OTHER): Payer: 59

## 2022-05-26 ENCOUNTER — Ambulatory Visit (INDEPENDENT_AMBULATORY_CARE_PROVIDER_SITE_OTHER): Payer: 59 | Admitting: Orthopaedic Surgery

## 2022-06-12 ENCOUNTER — Other Ambulatory Visit: Payer: Self-pay

## 2022-06-12 ENCOUNTER — Encounter (HOSPITAL_BASED_OUTPATIENT_CLINIC_OR_DEPARTMENT_OTHER): Payer: Self-pay | Admitting: Orthopaedic Surgery

## 2022-06-12 ENCOUNTER — Ambulatory Visit: Payer: 59 | Attending: Orthopaedic Surgery | Admitting: Orthopaedic Surgery

## 2022-06-12 VITALS — Ht 63.86 in | Wt 181.7 lb

## 2022-06-12 DIAGNOSIS — M1711 Unilateral primary osteoarthritis, right knee: Secondary | ICD-10-CM | POA: Insufficient documentation

## 2022-06-12 DIAGNOSIS — Z96642 Presence of left artificial hip joint: Secondary | ICD-10-CM | POA: Insufficient documentation

## 2022-06-12 MED ORDER — LIDOCAINE (PF) 10 MG/ML (1 %) INJECTION SOLUTION
4.0000 mL | Freq: Once | INTRAMUSCULAR | 0 refills | Status: AC
Start: 2022-06-12 — End: 2022-06-12

## 2022-06-12 MED ORDER — METHYLPREDNISOLONE ACETATE 80 MG/ML SUSPENSION FOR INJECTION
80.0000 mg | Freq: Once | INTRAMUSCULAR | 0 refills | Status: AC
Start: 2022-06-12 — End: 2022-06-12

## 2022-06-12 NOTE — Procedures (Signed)
JOINT REPLACEMENT, [place] [facility] [address]  [place] [address]  Operated by [facility]  Procedure Note    Name: Courtney [name] MRN:  [id number]   Date: 06/12/2022 Age: 56 y.o.  DOB:   1966-03-14       Large Joint Arthrocentesis  Details: 22 G needle, anterolateral approach    The pt was offered a corticosteroid injection and provided verbal consent.  The right knee was injected with 4cc's of 1% lidocaine and 80 mg depomedrol using sterile technique. The patient was advised that the injection site is frequently tender the evening of the injection and that on some occasions, the joint pain is worse after the injection for the first 24 to 48 hours.  The patient was advised to alternate between ice and heat, and to use OTC pain relievers for the pain until it subsides.  The patient was also advised to contact the office if the pain becomes severe and cannot be controlled with the above remedies.  The patient was warned about facial flushing and warmth after the injection and to use Benadryl if this occurs.  Finally the patient was advised that it may take up to 72 hours for the injection to reach peak effectiveness.  The patient was advised to contact this office for follow up if the injection provided no significant pain relief.  Kathee Delton MD  Assistant Professor Orthopaedic Surgery              Keylan Costabile Audree Camel, MD

## 2022-06-12 NOTE — Progress Notes (Signed)
[facility]    Chief Complaint   Patient presents with    Knee Pain        Subjective:  R knee pain getting worse  Injections have provided nearly 1-2 months pain relief  L hip is starting to bother her - going to see Dr. 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 Moderate L knee lateral compartment arthritis as well.       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

## 2022-06-17 ENCOUNTER — Other Ambulatory Visit (HOSPITAL_COMMUNITY): Payer: Self-pay | Admitting: Orthopaedic Surgery

## 2022-06-17 DIAGNOSIS — M1711 Unilateral primary osteoarthritis, right knee: Secondary | ICD-10-CM

## 2022-09-12 ENCOUNTER — Other Ambulatory Visit: Payer: Self-pay

## 2022-09-12 ENCOUNTER — Inpatient Hospital Stay
Admission: RE | Admit: 2022-09-12 | Discharge: 2022-09-12 | Disposition: A | Discharge status: Home / Self Care | Payer: 59 | Source: Ambulatory Visit | Attending: Orthopaedic Surgery | Admitting: Orthopaedic Surgery

## 2022-09-12 DIAGNOSIS — M1711 Unilateral primary osteoarthritis, right knee: Secondary | ICD-10-CM | POA: Insufficient documentation

## 2022-10-03 ENCOUNTER — Encounter (HOSPITAL_COMMUNITY): Payer: Self-pay | Admitting: Orthopaedic Surgery

## 2022-10-08 ENCOUNTER — Observation Stay
Admission: RE | Admit: 2022-10-08 | Discharge: 2022-10-09 | Disposition: A | Discharge status: Home / Self Care | Payer: 59 | Source: Ambulatory Visit | Attending: Orthopaedic Surgery | Admitting: Orthopaedic Surgery

## 2022-10-08 ENCOUNTER — Encounter (HOSPITAL_COMMUNITY): Payer: Self-pay | Admitting: Orthopaedic Surgery

## 2022-10-08 ENCOUNTER — Inpatient Hospital Stay (HOSPITAL_COMMUNITY): Payer: 59 | Admitting: Certified Registered"

## 2022-10-08 ENCOUNTER — Encounter (HOSPITAL_COMMUNITY)
Admission: RE | Disposition: A | Discharge status: Home / Self Care | Payer: Self-pay | Source: Ambulatory Visit | Attending: Orthopaedic Surgery

## 2022-10-08 ENCOUNTER — Observation Stay (HOSPITAL_COMMUNITY): Payer: 59 | Admitting: Orthopaedic Surgery

## 2022-10-08 ENCOUNTER — Other Ambulatory Visit: Payer: Self-pay

## 2022-10-08 DIAGNOSIS — Z96651 Presence of right artificial knee joint: Principal | ICD-10-CM

## 2022-10-08 DIAGNOSIS — F419 Anxiety disorder, unspecified: Secondary | ICD-10-CM | POA: Insufficient documentation

## 2022-10-08 DIAGNOSIS — E785 Hyperlipidemia, unspecified: Secondary | ICD-10-CM | POA: Insufficient documentation

## 2022-10-08 DIAGNOSIS — K219 Gastro-esophageal reflux disease without esophagitis: Secondary | ICD-10-CM | POA: Insufficient documentation

## 2022-10-08 DIAGNOSIS — E1151 Type 2 diabetes mellitus with diabetic peripheral angiopathy without gangrene: Secondary | ICD-10-CM | POA: Insufficient documentation

## 2022-10-08 DIAGNOSIS — M1711 Unilateral primary osteoarthritis, right knee: Principal | ICD-10-CM | POA: Insufficient documentation

## 2022-10-08 DIAGNOSIS — I10 Essential (primary) hypertension: Secondary | ICD-10-CM | POA: Insufficient documentation

## 2022-10-08 DIAGNOSIS — Z7985 Long-term (current) use of injectable non-insulin antidiabetic drugs: Secondary | ICD-10-CM | POA: Insufficient documentation

## 2022-10-08 DIAGNOSIS — D72829 Elevated white blood cell count, unspecified: Secondary | ICD-10-CM | POA: Insufficient documentation

## 2022-10-08 DIAGNOSIS — E039 Hypothyroidism, unspecified: Secondary | ICD-10-CM | POA: Insufficient documentation

## 2022-10-08 DIAGNOSIS — J45909 Unspecified asthma, uncomplicated: Secondary | ICD-10-CM | POA: Insufficient documentation

## 2022-10-08 DIAGNOSIS — M1990 Unspecified osteoarthritis, unspecified site: Secondary | ICD-10-CM | POA: Diagnosis present

## 2022-10-08 DIAGNOSIS — Z79899 Other long term (current) drug therapy: Secondary | ICD-10-CM | POA: Insufficient documentation

## 2022-10-08 DIAGNOSIS — M797 Fibromyalgia: Secondary | ICD-10-CM | POA: Insufficient documentation

## 2022-10-08 DIAGNOSIS — M7062 Trochanteric bursitis, left hip: Secondary | ICD-10-CM | POA: Insufficient documentation

## 2022-10-08 DIAGNOSIS — G4730 Sleep apnea, unspecified: Secondary | ICD-10-CM | POA: Insufficient documentation

## 2022-10-08 DIAGNOSIS — Z79890 Hormone replacement therapy (postmenopausal): Secondary | ICD-10-CM | POA: Insufficient documentation

## 2022-10-08 HISTORY — DX: Aneurysm of unspecified site: I72.9

## 2022-10-08 HISTORY — DX: Dependence on other enabling machines and devices: Z99.89

## 2022-10-08 HISTORY — DX: Anxiety disorder, unspecified: F41.9

## 2022-10-08 HISTORY — DX: Other general symptoms and signs: R68.89

## 2022-10-08 HISTORY — DX: Migraine, unspecified, not intractable, without status migrainosus: G43.909

## 2022-10-08 SURGERY — ARTHOPLASTY KNEE TOTAL MAKO ROBOTIC ASSISTED
Anesthesia: General | Site: Knee | Laterality: Right | Wound class: Clean Wound: Uninfected operative wounds in which no inflammation occurred

## 2022-10-08 MED ORDER — APREPITANT 40 MG CAPSULE
40.0000 mg | ORAL_CAPSULE | Freq: Once | ORAL | Status: AC
Start: 2022-10-08 — End: 2022-10-08
  Administered 2022-10-08: 40 mg via ORAL

## 2022-10-08 MED ORDER — GABAPENTIN 300 MG CAPSULE
300.0000 mg | ORAL_CAPSULE | Freq: Once | ORAL | Status: AC
Start: 2022-10-08 — End: 2022-10-08
  Administered 2022-10-08: 300 mg via ORAL

## 2022-10-08 MED ORDER — OXYCODONE-ACETAMINOPHEN 5 MG-325 MG TABLET
1.0000 | ORAL_TABLET | ORAL | Status: DC | PRN
Start: 2022-10-08 — End: 2022-10-09

## 2022-10-08 MED ORDER — FENTANYL (PF) 50 MCG/ML INJECTION WRAPPER
INJECTION | Freq: Once | INTRAMUSCULAR | Status: DC | PRN
Start: 2022-10-08 — End: 2022-10-08
  Administered 2022-10-08: 100 ug via INTRAVENOUS

## 2022-10-08 MED ORDER — FAMOTIDINE (PF) 20 MG/2 ML INTRAVENOUS SOLUTION
20.0000 mg | Freq: Once | INTRAVENOUS | Status: AC
Start: 2022-10-08 — End: 2022-10-08
  Administered 2022-10-08: 20 mg via INTRAVENOUS

## 2022-10-08 MED ORDER — AMITRIPTYLINE 25 MG TABLET
25.0000 mg | ORAL_TABLET | Freq: Every evening | ORAL | Status: DC
Start: 2022-10-08 — End: 2022-10-09
  Administered 2022-10-08: 0 mg via ORAL
  Filled 2022-10-08: qty 1

## 2022-10-08 MED ORDER — ACETAMINOPHEN 325 MG TABLET
ORAL_TABLET | ORAL | Status: AC
Start: 2022-10-08 — End: 2022-10-08
  Filled 2022-10-08: qty 3

## 2022-10-08 MED ORDER — LIDOCAINE (PF) 100 MG/5 ML (2 %) INTRAVENOUS SYRINGE
INJECTION | Freq: Once | INTRAVENOUS | Status: DC | PRN
Start: 2022-10-08 — End: 2022-10-08
  Administered 2022-10-08: 80 mg via INTRAVENOUS

## 2022-10-08 MED ORDER — FENTANYL (PF) 50 MCG/ML INJECTION WRAPPER
50.0000 ug | INJECTION | INTRAMUSCULAR | Status: DC | PRN
Start: 2022-10-08 — End: 2022-10-08
  Administered 2022-10-08 (×2): 50 ug via INTRAVENOUS

## 2022-10-08 MED ORDER — FAMOTIDINE (PF) 20 MG/2 ML INTRAVENOUS SOLUTION
INTRAVENOUS | Status: AC
Start: 2022-10-08 — End: 2022-10-08
  Filled 2022-10-08: qty 2

## 2022-10-08 MED ORDER — PROCHLORPERAZINE EDISYLATE 10 MG/2 ML (5 MG/ML) INJECTION SOLUTION
10.0000 mg | Freq: Four times a day (QID) | INTRAMUSCULAR | Status: DC | PRN
Start: 2022-10-08 — End: 2022-10-09
  Administered 2022-10-08 – 2022-10-09 (×2): 10 mg via INTRAVENOUS
  Filled 2022-10-08 (×2): qty 2

## 2022-10-08 MED ORDER — CEFAZOLIN 1 GRAM SOLUTION FOR INJECTION
INTRAMUSCULAR | Status: AC
Start: 2022-10-08 — End: 2022-10-08
  Filled 2022-10-08: qty 20

## 2022-10-08 MED ORDER — DEXAMETHASONE SODIUM PHOSPHATE (PF) 10 MG/ML INJECTION SOLUTION
INTRAMUSCULAR | Status: AC
Start: 2022-10-08 — End: 2022-10-08
  Filled 2022-10-08: qty 1

## 2022-10-08 MED ORDER — DEXAMETHASONE SODIUM PHOSPHATE (PF) 10 MG/ML INJECTION SOLUTION
10.0000 mg | Freq: Once | INTRAMUSCULAR | Status: AC
Start: 2022-10-08 — End: 2022-10-08
  Administered 2022-10-08: 10 mg via INTRAVENOUS

## 2022-10-08 MED ORDER — FENTANYL (PF) 50 MCG/ML INJECTION SOLUTION
INTRAMUSCULAR | Status: AC
Start: 2022-10-08 — End: 2022-10-08
  Filled 2022-10-08: qty 2

## 2022-10-08 MED ORDER — CELECOXIB 100 MG CAPSULE
ORAL_CAPSULE | ORAL | Status: AC
Start: 2022-10-08 — End: 2022-10-08
  Filled 2022-10-08: qty 2

## 2022-10-08 MED ORDER — ONDANSETRON HCL (PF) 4 MG/2 ML INJECTION SOLUTION
4.0000 mg | Freq: Once | INTRAMUSCULAR | Status: DC | PRN
Start: 2022-10-08 — End: 2022-10-08

## 2022-10-08 MED ORDER — MIDAZOLAM 5 MG/ML INJECTION WRAPPER
Freq: Once | INTRAMUSCULAR | Status: DC | PRN
Start: 2022-10-08 — End: 2022-10-08
  Administered 2022-10-08: 5 mg via INTRAVENOUS

## 2022-10-08 MED ORDER — ACETAMINOPHEN 325 MG TABLET
975.0000 mg | ORAL_TABLET | Freq: Four times a day (QID) | ORAL | Status: AC
Start: 2022-10-08 — End: 2022-10-08
  Administered 2022-10-08 (×2): 0 mg via ORAL
  Filled 2022-10-08 (×3): qty 3

## 2022-10-08 MED ORDER — CELECOXIB 100 MG CAPSULE
100.0000 mg | ORAL_CAPSULE | Freq: Once | ORAL | Status: AC
Start: 2022-10-08 — End: 2022-10-08
  Administered 2022-10-08: 200 mg via ORAL

## 2022-10-08 MED ORDER — PHENYLEPHRINE 10 MG/ML INJECTION SOLUTION
Freq: Once | INTRAMUSCULAR | Status: DC | PRN
Start: 2022-10-08 — End: 2022-10-08
  Administered 2022-10-08 (×2): 200 ug via INTRAVENOUS
  Administered 2022-10-08: 100 ug via INTRAVENOUS

## 2022-10-08 MED ORDER — LACTATED RINGERS INTRAVENOUS SOLUTION
INTRAVENOUS | Status: DC
Start: 2022-10-08 — End: 2022-10-09

## 2022-10-08 MED ORDER — DEXAMETHASONE SODIUM PHOSPHATE (PF) 10 MG/ML INJECTION SOLUTION
8.0000 mg | Freq: Two times a day (BID) | INTRAMUSCULAR | Status: DC
Start: 2022-10-08 — End: 2022-10-09
  Administered 2022-10-08: 0 mg via INTRAVENOUS
  Administered 2022-10-09: 8 mg via INTRAVENOUS
  Filled 2022-10-08 (×2): qty 1

## 2022-10-08 MED ORDER — ROPIVACAINE (PF) 2 MG/ML (0.2 %) INJECTION SOLUTION
INTRAMUSCULAR | Status: AC
Start: 2022-10-08 — End: 2022-10-08
  Filled 2022-10-08: qty 30

## 2022-10-08 MED ORDER — MULTIVITAMIN WITH FOLIC ACID 400 MCG TABLET
1.0000 | ORAL_TABLET | Freq: Every day | ORAL | Status: DC
Start: 2022-10-08 — End: 2022-10-09
  Administered 2022-10-08: 0 via ORAL
  Administered 2022-10-09: 1 via ORAL
  Filled 2022-10-08 (×3): qty 1

## 2022-10-08 MED ORDER — PROCHLORPERAZINE EDISYLATE 10 MG/2 ML (5 MG/ML) INJECTION SOLUTION
5.0000 mg | Freq: Once | INTRAMUSCULAR | Status: DC | PRN
Start: 2022-10-08 — End: 2022-10-08

## 2022-10-08 MED ORDER — OXYCODONE ER 10 MG TABLET,CRUSH RESISTANT,EXTENDED RELEASE 12 HR
EXTENDED_RELEASE_ORAL_TABLET | ORAL | Status: AC
Start: 2022-10-08 — End: 2022-10-08
  Filled 2022-10-08: qty 2

## 2022-10-08 MED ORDER — MORPHINE 2 MG/ML INJECTION WRAPPER
2.0000 mg | INJECTION | INTRAMUSCULAR | Status: DC | PRN
Start: 2022-10-08 — End: 2022-10-08

## 2022-10-08 MED ORDER — IPRATROPIUM 0.5 MG-ALBUTEROL 3 MG (2.5 MG BASE)/3 ML NEBULIZATION SOLN
3.0000 mL | INHALATION_SOLUTION | Freq: Once | RESPIRATORY_TRACT | Status: DC | PRN
Start: 2022-10-08 — End: 2022-10-08

## 2022-10-08 MED ORDER — MIDAZOLAM 5 MG/ML INJECTION WRAPPER
INTRAMUSCULAR | Status: AC
Start: 2022-10-08 — End: 2022-10-08
  Filled 2022-10-08: qty 1

## 2022-10-08 MED ORDER — SUCCINYLCHOLINE 20 MG/ML INTRAVENOUS WRAPPER
INJECTION | Freq: Once | INTRAVENOUS | Status: DC | PRN
Start: 2022-10-08 — End: 2022-10-08
  Administered 2022-10-08: 140 mg via INTRAVENOUS

## 2022-10-08 MED ORDER — OXYCODONE ER 10 MG TABLET,CRUSH RESISTANT,EXTENDED RELEASE 12 HR
20.0000 mg | EXTENDED_RELEASE_ORAL_TABLET | Freq: Once | ORAL | Status: AC
Start: 2022-10-08 — End: 2022-10-08
  Administered 2022-10-08: 20 mg via ORAL

## 2022-10-08 MED ORDER — TRANEXAMIC ACID 1,000 MG/10 ML (100 MG/ML) INTRAVENOUS SOLUTION
INTRAVENOUS | Status: AC
Start: 2022-10-08 — End: 2022-10-08
  Filled 2022-10-08: qty 10

## 2022-10-08 MED ORDER — FENTANYL (PF) 50 MCG/ML INJECTION WRAPPER
25.0000 ug | INJECTION | INTRAMUSCULAR | Status: DC | PRN
Start: 2022-10-08 — End: 2022-10-08

## 2022-10-08 MED ORDER — FAMOTIDINE 20 MG TABLET
20.0000 mg | ORAL_TABLET | Freq: Two times a day (BID) | ORAL | Status: DC
Start: 2022-10-08 — End: 2022-10-09
  Administered 2022-10-08 (×2): 0 mg via ORAL
  Administered 2022-10-09: 20 mg via ORAL
  Filled 2022-10-08 (×3): qty 1

## 2022-10-08 MED ORDER — DOCUSATE SODIUM 100 MG CAPSULE
100.0000 mg | ORAL_CAPSULE | Freq: Two times a day (BID) | ORAL | Status: DC
Start: 2022-10-08 — End: 2022-10-09
  Administered 2022-10-08 (×2): 0 mg via ORAL
  Administered 2022-10-09: 100 mg via ORAL
  Filled 2022-10-08 (×3): qty 1

## 2022-10-08 MED ORDER — SODIUM CHLORIDE 0.9 % (FLUSH) INJECTION SYRINGE
3.0000 mL | INJECTION | INTRAMUSCULAR | Status: DC | PRN
Start: 2022-10-08 — End: 2022-10-09

## 2022-10-08 MED ORDER — LACTATED RINGERS INTRAVENOUS SOLUTION
INTRAVENOUS | Status: DC
Start: 2022-10-08 — End: 2022-10-08

## 2022-10-08 MED ORDER — ETHYL ALCOHOL 62 % (NOZIN NASAL SANITIZER) NASAL SOLUTION - BULK BOTTLE
1.0000 | Freq: Three times a day (TID) | NASAL | Status: DC
Start: 2022-10-08 — End: 2022-10-09
  Administered 2022-10-08: 0 via NASAL
  Administered 2022-10-08 – 2022-10-09 (×2): 1 via NASAL

## 2022-10-08 MED ORDER — HYDROMORPHONE 2 MG/ML INJECTION WRAPPER
0.2000 mg | INJECTION | INTRAMUSCULAR | Status: DC | PRN
Start: 2022-10-08 — End: 2022-10-09
  Administered 2022-10-08: 0.2 mg via INTRAVENOUS
  Filled 2022-10-08: qty 1

## 2022-10-08 MED ORDER — LEVOTHYROXINE 150 MCG TABLET
75.0000 ug | ORAL_TABLET | Freq: Every morning | ORAL | Status: DC
Start: 2022-10-08 — End: 2022-10-09
  Administered 2022-10-08: 0 ug via ORAL
  Administered 2022-10-09: 75 ug via ORAL
  Filled 2022-10-08 (×2): qty 1

## 2022-10-08 MED ORDER — EPHEDRINE SULFATE 50 MG/ML INTRAVENOUS SOLUTION
Freq: Once | INTRAVENOUS | Status: DC | PRN
Start: 2022-10-08 — End: 2022-10-08
  Administered 2022-10-08 (×2): 10 mg via INTRAVENOUS
  Administered 2022-10-08 (×2): 15 mg via INTRAVENOUS

## 2022-10-08 MED ORDER — ETHYL ALCOHOL 62 % (NOZIN NASAL SANITIZER) NASAL SOLUTION - BULK BOTTLE
1.0000 | Freq: Once | NASAL | Status: AC
Start: 2022-10-08 — End: 2022-10-08
  Administered 2022-10-08: 1 via NASAL

## 2022-10-08 MED ORDER — ONDANSETRON HCL (PF) 4 MG/2 ML INJECTION SOLUTION
4.0000 mg | INTRAMUSCULAR | Status: DC | PRN
Start: 2022-10-08 — End: 2022-10-08

## 2022-10-08 MED ORDER — SODIUM CHLORIDE 0.9 % (FLUSH) INJECTION SYRINGE
3.0000 mL | INJECTION | INTRAMUSCULAR | Status: DC | PRN
Start: 2022-10-08 — End: 2022-10-08

## 2022-10-08 MED ORDER — MIDAZOLAM 5 MG/ML INJECTION WRAPPER
3.0000 mg | Freq: Once | INTRAMUSCULAR | Status: DC | PRN
Start: 2022-10-08 — End: 2022-10-08
  Administered 2022-10-08: 3 mg via INTRAVENOUS

## 2022-10-08 MED ORDER — SODIUM CHLORIDE 0.9 % INTRAVENOUS SOLUTION
INTRAVENOUS | Status: AC
Start: 2022-10-08 — End: 2022-10-09

## 2022-10-08 MED ORDER — SODIUM CHLORIDE 0.9 % (FLUSH) INJECTION SYRINGE
3.0000 mL | INJECTION | Freq: Three times a day (TID) | INTRAMUSCULAR | Status: DC
Start: 2022-10-08 — End: 2022-10-09
  Administered 2022-10-08: 0 mL
  Administered 2022-10-08: 3 mL
  Administered 2022-10-08: 0 mL
  Administered 2022-10-09: 3 mL

## 2022-10-08 MED ORDER — LOSARTAN 50 MG TABLET
100.0000 mg | ORAL_TABLET | Freq: Every day | ORAL | Status: DC
Start: 2022-10-08 — End: 2022-10-09
  Administered 2022-10-08: 0 mg via ORAL
  Administered 2022-10-09: 100 mg via ORAL
  Filled 2022-10-08 (×2): qty 2

## 2022-10-08 MED ORDER — GABAPENTIN 300 MG CAPSULE
ORAL_CAPSULE | ORAL | Status: AC
Start: 2022-10-08 — End: 2022-10-08
  Filled 2022-10-08: qty 1

## 2022-10-08 MED ORDER — PROPOFOL 10 MG/ML IV BOLUS
INJECTION | Freq: Once | INTRAVENOUS | Status: DC | PRN
Start: 2022-10-08 — End: 2022-10-08
  Administered 2022-10-08: 100 mg via INTRAVENOUS
  Administered 2022-10-08: 150 mg via INTRAVENOUS

## 2022-10-08 MED ORDER — LACTATED RINGERS INTRAVENOUS SOLUTION
INTRAVENOUS | Status: DC
Start: 2022-10-08 — End: 2022-10-09
  Administered 2022-10-08: 1000 mL via INTRAVENOUS

## 2022-10-08 MED ORDER — BISACODYL 10 MG RECTAL SUPPOSITORY
10.0000 mg | Freq: Every day | RECTAL | Status: DC | PRN
Start: 2022-10-08 — End: 2022-10-09

## 2022-10-08 MED ORDER — APREPITANT 40 MG CAPSULE
ORAL_CAPSULE | ORAL | Status: AC
Start: 2022-10-08 — End: 2022-10-08
  Filled 2022-10-08: qty 1

## 2022-10-08 MED ORDER — ROPIVACAINE (PF) 2 MG/ML (0.2 %) INJECTION SOLUTION
Freq: Once | INTRAMUSCULAR | Status: DC | PRN
Start: 2022-10-08 — End: 2022-10-08
  Administered 2022-10-08: 30 mL via INTRAMUSCULAR

## 2022-10-08 MED ORDER — ALBUTEROL SULFATE 2.5 MG/3 ML (0.083 %) SOLUTION FOR NEBULIZATION
2.5000 mg | INHALATION_SOLUTION | Freq: Once | RESPIRATORY_TRACT | Status: DC | PRN
Start: 2022-10-08 — End: 2022-10-08

## 2022-10-08 MED ORDER — NALOXONE 0.4 MG/ML INJECTION SOLUTION
0.4000 mg | INTRAMUSCULAR | Status: DC | PRN
Start: 2022-10-08 — End: 2022-10-09

## 2022-10-08 MED ORDER — KETOROLAC 30 MG/ML (1 ML) INJECTION SOLUTION
15.0000 mg | Freq: Three times a day (TID) | INTRAMUSCULAR | Status: DC
Start: 2022-10-08 — End: 2022-10-09
  Administered 2022-10-08: 0 mg via INTRAVENOUS
  Administered 2022-10-08: 15 mg via INTRAVENOUS
  Administered 2022-10-09: 0 mg via INTRAVENOUS
  Filled 2022-10-08 (×3): qty 1

## 2022-10-08 MED ORDER — OXYCODONE-ACETAMINOPHEN 5 MG-325 MG TABLET
2.0000 | ORAL_TABLET | ORAL | Status: DC | PRN
Start: 2022-10-08 — End: 2022-10-09
  Administered 2022-10-08 – 2022-10-09 (×4): 2 via ORAL
  Filled 2022-10-08 (×4): qty 2

## 2022-10-08 MED ORDER — CHOLECALCIFEROL (VITAMIN D3) 25 MCG (1,000 UNIT) TABLET
1000.0000 [IU] | ORAL_TABLET | Freq: Every day | ORAL | Status: DC
Start: 2022-10-08 — End: 2022-10-09
  Administered 2022-10-08: 0 [IU] via ORAL
  Administered 2022-10-09: 1000 [IU] via ORAL
  Filled 2022-10-08 (×2): qty 1

## 2022-10-08 MED ORDER — ACETAMINOPHEN 325 MG TABLET
650.0000 mg | ORAL_TABLET | ORAL | Status: DC | PRN
Start: 2022-10-08 — End: 2022-10-09

## 2022-10-08 MED ORDER — CEFAZOLIN 1 GRAM SOLUTION FOR INJECTION
2.0000 g | Freq: Once | INTRAMUSCULAR | Status: AC
Start: 2022-10-08 — End: 2022-10-08
  Administered 2022-10-08: 2 g via INTRAVENOUS

## 2022-10-08 MED ORDER — TRAMADOL 50 MG TABLET
50.0000 mg | ORAL_TABLET | Freq: Four times a day (QID) | ORAL | Status: DC
Start: 2022-10-08 — End: 2022-10-09
  Administered 2022-10-08 – 2022-10-09 (×4): 0 mg via ORAL
  Filled 2022-10-08 (×3): qty 1

## 2022-10-08 MED ORDER — ASPIRIN 81 MG CHEWABLE TABLET
81.0000 mg | CHEWABLE_TABLET | Freq: Two times a day (BID) | ORAL | Status: DC
Start: 2022-10-09 — End: 2022-10-09
  Administered 2022-10-09: 81 mg via ORAL
  Filled 2022-10-08: qty 1

## 2022-10-08 MED ORDER — ONDANSETRON HCL (PF) 4 MG/2 ML INJECTION SOLUTION
8.0000 mg | Freq: Once | INTRAMUSCULAR | Status: DC
Start: 2022-10-08 — End: 2022-10-08
  Administered 2022-10-08: 0 mg via INTRAVENOUS

## 2022-10-08 MED ORDER — ACETAMINOPHEN 325 MG TABLET
975.0000 mg | ORAL_TABLET | Freq: Once | ORAL | Status: AC
Start: 2022-10-08 — End: 2022-10-08
  Administered 2022-10-08: 975 mg via ORAL

## 2022-10-08 MED ORDER — SODIUM CHLORIDE 0.9 % (FLUSH) INJECTION SYRINGE
3.0000 mL | INJECTION | Freq: Three times a day (TID) | INTRAMUSCULAR | Status: DC
Start: 2022-10-08 — End: 2022-10-08

## 2022-10-08 MED ORDER — SODIUM CHLORIDE 0.9 % INTRAVENOUS PIGGYBACK
1000.0000 mg | INJECTION | Freq: Once | INTRAVENOUS | Status: AC
Start: 2022-10-08 — End: 2022-10-08
  Administered 2022-10-08: 1000 mg via INTRAVENOUS

## 2022-10-08 SURGICAL SUPPLY — 72 items
ADH SKNCLS CYNCRLT EXOFIN HVSC STRL TISS LF  DISP 1G (MED SURG SUPPLIES) ×1 IMPLANT
BANDAGE ELT 5.8YDX4IN NONST SLFCLS ELAS KNIT TEAL END STCH VELCRO COTTON POLY BLND STD LGTH COMPRESS (WOUND CARE SUPPLY) ×1 IMPLANT
BANDAGE ELT 5.8YDX6IN NONST SLFCLS ELAS KNIT STRCH VELCRO COTTON POLY BLND STD LGTH COMPRESS BGE HNY (WOUND CARE SUPPLY) ×1 IMPLANT
BASEPLATE TIB TRIATH 4 KNEE TRIT (IMPLANTS KNEE) ×1 IMPLANT
BLADE 10 2 END CBNSTL SURG STRL DISP (SURGICAL CUTTING SUPPLIES) ×5 IMPLANT
BLADE NRW NRW SGTL MICS SURG STRL DISP (SURGICAL CUTTING SUPPLIES) IMPLANT
BLADE SAW 90X25X1.27MM SGTL STRL LF (SURGICAL CUTTING SUPPLIES) ×1 IMPLANT
BLADE SAW STD MAKO STRL LF  DISP (SURGICAL CUTTING SUPPLIES) ×2 IMPLANT
BLADE SURG CLPR W 37.2MM GP EXIST HNDL GTT IN CHRG .23MM NONST LF  DISP (MED SURG SUPPLIES) IMPLANT
CEMENT BONE SIMPLEX RADOPQ FD STRL (CEMENT) IMPLANT
CONV USE 123874 - SYRINGE AMSURE MDCHC 60CC LF  STRL TIP PRTC SM TUBE ADPR IRRG DISC BULB POLYPROP (MED SURG SUPPLIES) IMPLANT
CONV USE ITEM 338662 - PACK SURG ASCP STRL DISP ~~LOC~~ BPT MED CNTR LF (CUSTOM TRAYS & PACK) ×1 IMPLANT
CORD RETRACT SILI (DENTAL PRODUCTS) IMPLANT
COUNTER 20 CNT BLOCK ADH NEEDLE STRL LF  RD SHARP FOAM 15.75X11.5X14IN DISP (MED SURG SUPPLIES) ×1 IMPLANT
COVER TBL 90X50IN STD SMS REINF FNFLD STRL LF  DISP (DRAPE/PACKS/SHEETS/OR TOWEL) ×4 IMPLANT
DEVICE SUCT 2 FILTER CHAMBER SCKR STRL LF  DISP (MED SURG SUPPLIES) ×1 IMPLANT
DRAPE FNFLD ABS REINF 77X53IN 43528 PRXM LF  STRL DISP SURG SMS 44X23IN (DRAPE/PACKS/SHEETS/OR TOWEL) ×2 IMPLANT
DRAPE INCS ANTIMIC 23X23IN IOBN2 TRNSPR (DRAPE/PACKS/SHEETS/OR TOWEL) ×1 IMPLANT
DRESS 12X4IN PS MPLX BR FOAM ACUTE SURG WOUND (WOUND CARE SUPPLY) ×1 IMPLANT
DRESS COMPRESS 13FTX6IN JNS COTTON RL HI ABS LF  STRL .5LB (WOUND CARE SUPPLY) ×1 IMPLANT
ELECTRODE ESURG BLADE PNCL 15FT VLAB EDGE TELESCP SMOKE EVAC (SURGICAL CUTTING SUPPLIES) ×1 IMPLANT
FEM 4 PA CRCTE RTN BEAD TRIATH KNEE RGT COM STRL LF (IMPLANTS KNEE) ×1 IMPLANT
GLOVE SURG 6 LF  PF BEAD CUF STRL CRM 11.3IN PROTEXIS PLISPRN THK9.1 MIL (GLOVES AND ACCESSORIES) IMPLANT
GLOVE SURG 6 LF  PF SMOOTH BEAD CUF INTLK STRL BLU 11.3IN PROTEXIS NEU-THERA PLISPRN THK7.9 MIL (GLOVES AND ACCESSORIES) IMPLANT
GLOVE SURG 6.5 LF  PF BEAD CUF STRL CRM 11.3IN PROTEXIS PI PLISPRN THK9.1 MIL (GLOVES AND ACCESSORIES) IMPLANT
GLOVE SURG 6.5 LF  PF SMOOTH BEAD CUF INTLK STRL BLU 11.3IN PROTEXIS NEU-THERA PLISPRN THK7.9 MIL (GLOVES AND ACCESSORIES) IMPLANT
GLOVE SURG 6.5 LTX PF SMOOTH BEAD CUF STRL YW 11.5IN PROTEXIS NEU-THERA DDRGL THK8.7 MIL (GLOVES AND ACCESSORIES) IMPLANT
GLOVE SURG 7 LF  PF BEAD CUF STRL CRM 11.8IN PROTEXIS PI PLISPRN THK9.1 MIL (GLOVES AND ACCESSORIES) IMPLANT
GLOVE SURG 7 LF  PF SMOOTH BEAD CUF INTLK STRL BLU 11.8IN PROTEXIS NEU-THERA PLISPRN THK7.9 MIL (GLOVES AND ACCESSORIES) IMPLANT
GLOVE SURG 7 LTX PF SMOOTH BEAD CUF STRL YW 12IN PROTEXIS NEU-THERA DDRGL THK8.7 MIL (GLOVES AND ACCESSORIES) IMPLANT
GLOVE SURG 7.5 LF  PF BEAD CUF STRL CRM 11.8IN PROTEXIS PI PLISPRN THK9.1 MIL (GLOVES AND ACCESSORIES) ×1 IMPLANT
GLOVE SURG 7.5 LF  PF SMOOTH BEAD CUF INTLK STRL BLU 11.8IN PROTEXIS NEU-THERA PLISPRN THK7.9 MIL (GLOVES AND ACCESSORIES) ×3 IMPLANT
GLOVE SURG 7.5 LTX PF SMOOTH BEAD CUF STRL YW 12IN PROTEXIS (GLOVES AND ACCESSORIES) ×1 IMPLANT
GLOVE SURG 8 LF  PF BEAD CUF STRL CRM 11.8IN PROTEXIS PI PLISPRN THK9.1 MIL (GLOVES AND ACCESSORIES) IMPLANT
GLOVE SURG 8 LF  PF SMOOTH BEAD CUF INTLK STRL BLU 11.8IN PROTEXIS NEU-THERA PLISPRN THK7.9 MIL (GLOVES AND ACCESSORIES) IMPLANT
GLOVE SURG 8 LTX PF SMOOTH BEAD CUF STRL YW 12IN PROTEXIS NEU-THERA DDRGL THK8.7 MIL (GLOVES AND ACCESSORIES) ×1 IMPLANT
GLOVE SURG 8.5 LF  PF BEAD CUF STRL CRM 11.8IN PROTEXIS PI PLISPRN THK9.1 MIL (GLOVES AND ACCESSORIES) IMPLANT
GOWN SURG LRG STD LGTH REG L3 NONREINFORCE BRTHBL TWL STRL LF  DISP BLU HALYARD SPECTRUM SMS (DRAPE/PACKS/SHEETS/OR TOWEL) ×1 IMPLANT
GOWN SURG XL STD LGTH L3 HKLP CLSR RGLN SLEEVE TWL STRL LF  DISP GRN AERO BLU PRFRM FBRC (DRAPE/PACKS/SHEETS/OR TOWEL) ×2 IMPLANT
HEMOSTAT ABS 14X2IN FLXB SHR W_V SRGCL STRL DISP (WOUND CARE SUPPLY) ×1 IMPLANT
INSERT TIB BRNG CRCTE RTN 4 9MM TRIATH X3 KNEE STRL LF (IMPLANTS KNEE) ×1 IMPLANT
KIT 1 PC PCKT DRP RIO RIO (DRAPE/PACKS/SHEETS/OR TOWEL) ×1 IMPLANT
LABEL MED CORRECT MED LABELING SYS 4 FLG 2 SHEET 24 PRPRNT STRL (MED SURG SUPPLIES) ×1 IMPLANT
MARKER SURG CKPNT KIT STRL LF  DISP (MED SURG SUPPLIES) ×1 IMPLANT
MATTRESS TRANSF 78X34IN AIRPAL C1000LB LONG STD 2 BELT 2 HOSE ATTACHMENT PNT SNAP BUTTON LBL DISP (MED SURG SUPPLIES) ×1 IMPLANT
MIXER BONE CEM MXVC3 STRL LF DISP (ORTHOPEDICS (NOT IMPLANTS)) IMPLANT
NAVIGATE VIZADISC KNEE TRK KIT SYSTEM STRL LF  DISP (MED SURG SUPPLIES) ×1 IMPLANT
NEEDLE 1.5IN 18GA POLYPROP FIL LL HUB DEHP-FR STRL BLUNT BD REG WL LF  DISP RD (MED SURG SUPPLIES) ×1 IMPLANT
PACK SURG ASCP STRL DISP ~~LOC~~ BPT MED CNTR LF (CUSTOM TRAYS & PACK) ×1
PATEL 10MM 32MM TRIT METAL ASYM TRIATH KNEE COM STRL (IMPLANTS KNEE) ×1 IMPLANT
PEN SURG MRKNG DISP RLR LBL STRL LF  6IN (MED SURG SUPPLIES) ×1 IMPLANT
PIN FIX 110MM 4MM BONE STRL (IMPLANTS TRAUMA) ×1 IMPLANT
PIN FIX 140MM 4MM STR KNEE STRL (IMPLANTS TRAUMA) ×1 IMPLANT
POSITION OR LEG KIT DISP (MED SURG SUPPLIES) IMPLANT
SEALER ESURG .226IN .137IN AQUAMANTYS 6 30D .236IN SPC BIPOLAR 2 ELECTRODE HMST 5.1IN STRL LF  DISP (SURGICAL CUTTING SUPPLIES) ×1 IMPLANT
SET INTPLS SUCT TUBE FAN SPRAY TIP HANDPC STRL LF  DISP (MED SURG SUPPLIES) ×1 IMPLANT
SOL IRRG 0.9% NACL 2000ML PRSV FR N-PYRG FLXB CONTAINR STRL LF (MEDICATIONS/SOLUTIONS) ×1 IMPLANT
SOL IV 0.9% NACL 500ML PLASTIC CONTAINR VIAFLEX LF (MEDICATIONS/SOLUTIONS) ×1 IMPLANT
SOL SURG PREP 26ML DRPRP 74% ISPRP 0.7% IOD POVACRYLEX SLF CNTN APPL SKIN STRL PREOP (MED SURG SUPPLIES) ×1 IMPLANT
SPONGE LAP 18X18IN PREWASH RIGID TRY STRL LF  WHT (MED SURG SUPPLIES) ×2 IMPLANT
STKNT ORTHO 48X12IN MDCHC IMPRV MOIST RPLNT BARRIER LF  STRL (ORTHOPEDICS (NOT IMPLANTS)) IMPLANT
SUTURE 1 CT STRATAFIX PDS + 18IN VIOL ABS KNOTLESS TISS CONTROL SMTR ABS (SUTURE/WOUND CLOSURE) ×1 IMPLANT
SUTURE 1 CT VICRYL 36IN VIOL BRD COAT ABS (SUTURE/WOUND CLOSURE) ×5 IMPLANT
SUTURE 2-0 CT1 STRATAFIX PDS + 18IN VIOL ABS KNOTLESS TISS CONTROL SMTR ABS (SUTURE/WOUND CLOSURE) IMPLANT
SUTURE 2-0 GS-21 POLYSRB 30IN UNDYED BRD COAT ABS (SUTURE/WOUND CLOSURE) ×3 IMPLANT
SUTURE 3-0 PS2 MONOCRYL MTPS 27IN UNDYED MONOF ABS (SUTURE/WOUND CLOSURE) ×1 IMPLANT
SUTURE 4-0 FS1 PROLENE 18IN BLU MONOF NONAB (SUTURE/WOUND CLOSURE) ×2 IMPLANT
SYRINGE AMSURE MDCHC 60CC LF  STRL TIP PRTC SM TUBE ADPR IRRG DISC BULB POLYPROP (MED SURG SUPPLIES)
SYRINGE LL 30ML LF  STRL CONCEN TIP GRAD N-PYRG DEHP-FR MED DISP (MED SURG SUPPLIES) ×1 IMPLANT
TOWEL 24X16IN COTTON BLU DISP SURG STRL LF (DRAPE/PACKS/SHEETS/OR TOWEL) ×4 IMPLANT
TUBING SUCT CLR 6FT .25IN ARGYLE PVC NCDTV STR MALE FEMALE MLD CONN STRL LF (MED SURG SUPPLIES) IMPLANT
WOUND IRRG IRRISEPT DBRD CLNSG 0.05% CHG SYSTEM STRL LF (WOUND CARE SUPPLY) ×1 IMPLANT

## 2022-10-08 NOTE — Anesthesia Preprocedure Evaluation (Signed)
ANESTHESIA PRE-OP EVALUATION  Planned Procedure: MAKO ROBOTIC ASSISTED RIGHT TOTAL KNEE ARTHROPLASTY USING TRIATHLON IMPLANTS (Right: Knee)  Review of Systems    PONV                 Pulmonary   asthma, sleep apnea and CPAP,   Cardiovascular    Hypertension, peripheral edema, PVD and hyperlipidemia ,       GI/Hepatic/Renal    GERD        Endo/Other    hypothyroidism and anemia,   type 2 diabetes/ controlled    Neuro/Psych/MS    headaches, back abnormality, fibromyalgia, anxiety    peripheral neuropathy,  Cancer                        Physical Assessment      Airway       Mallampati: II      Mouth Opening: good.            Dental                    Pulmonary           Cardiovascular        (+) peripheral edema present        Other findings              Plan  ASA 3     Planned anesthesia type: general

## 2022-10-08 NOTE — Anesthesia Procedure Notes (Signed)
[facility]    Airway Note  General Information and Staff   Authorizing provider: Delle Reining, MD  Performing provider: Bobette Mo, CRNA        Urgency: elective    Airway not difficult    Indications and Patient Condition  Pt location: In Or  Indications for airway management: anesthesia  Spontaneous ventilation: present  Sedation level: deep  Preoxygenated: yes  Patient position: sniffing  Mask difficulty assessment: 2 - vent by mask + OA or adjuvant +/- NMBA        Final Airway Details  Final airway type: endotracheal airway        Successful airway: ETT     Successful intubation technique: direct laryngoscopy  Facilitating devices/methods: cricoid pressure and intubating stylet              Blade: Macintosh  Blade size: #3  Airway size (mm): 7.5  Cormack-Lehane Classification: grade I - full view of glottis  Placement verified by: chest auscultation and capnometry   Marked at 21  Measured from: lips  Secured with: Tape  Number of attempts at approach: 3 or more  Ventilation between attempts: BVMAirway complications: Atraumatic  Additional Comments  CRNA atempy x 2. Dr Wynona Dove attempt x 1

## 2022-10-08 NOTE — PT Evaluation (Signed)
[facility]  [address]  [place], [address]  ([phone]  (Fax) [phone]  Rehabilitation Services  Physical Therapy Inpatient TKA Initial Evaluation    Patient Name: Courtney [name]  Date of Birth: 1965-11-11  Height: Height: 162.6 cm (5\' 4" )  Weight: Weight: 80.3 kg (177 lb)  Room/Bed: 381/A  Payor: PEIA / Plan: PEIA/UMR / Product Type: Non Managed Care /       PMH:  Past Medical History:   Diagnosis Date    Anemia 12/29/2018    iron Deficiency     Aneurysm (CMS HCC)     Anxiety     Arthritis     Asthma 12/29/2018    childhood, no inhalers since    Back problem     Constipation     CPAP (continuous positive airway pressure) dependence     Disorder of liver     Fatty Liver    Eczema 12/29/2018    hands x 1 year- eczema- seen derm    Esophageal reflux 12/29/2018    controlled    Fibromyalgia     Headache     Heart murmur 12/29/2018    Reports she was told this in her 60s    Hx of transfusion 12/29/2018    denies rx    Hyperlipidemia     Hypertension     Hypothyroid     Migraine     Nausea with vomiting     Neck problem     Peripheral edema 12/29/2018    chronic left lower extremity swelling     Peripheral neuropathy     Peripheral vascular disease (CMS HCC)     left lower extremity     PONV (postoperative nausea and vomiting)     Pre-diabetes     Problems with swallowing     Shortness of breath 12/29/2018    DOE- several years    Sleep apnea 12/29/2018    doesn't wear CPAP     Thyroid disease     Type 2 diabetes mellitus (CMS HCC)     Diagnosed 06/2021, Hgba1c- ?    Wears glasses            Assessment:      (P) Patient was admitted for an elective right TKA. She had been doing well postoperatively with some pain, but with activity experienced significant nausea, dizziness and lightheadedness. She will benefit from continued PT services throughout her stay for further education and instruction following TKA protocol. She plans to return to home with husband at  discharge and will attend outpatient PT services.    Discharge Needs:    Equipment Recommendation: (P) front wheeled walker      The patient presents with mobility limitations due to impaired range of motion, impaired strength, weight bearing restrictions, impaired functional activity tolerance, and pain  that significantly impair/prevent patient's ability to participate in mobility-related activities of daily living (MRADLs) including  ambulation and transfers in order to safely complete, toileting, bathing, food preparation, working, laundering/household tasks, safely entering/exiting the home. This functional mobility deficit can be improved with the use of a (P) front wheeled walker  in order to decrease the risk of falls in performance of these MRADLs.  Patient is able to safely use this assistive device.    Discharge Disposition: (P) home with assist, home with outpatient services    JUSTIFICATION OF DISCHARGE RECOMMENDATION   Based on current diagnosis, functional performance prior to admission, and current functional  performance, this patient requires continued PT services in (P) home with assist, home with outpatient services in order to achieve significant functional improvements in these deficit areas: (P) arousal, attention, and cognition, gait, locomotion, and balance, muscle performance, ROM (range of motion), other (see comments) (nausea and dizziness).        Plan:   Current Intervention: (P) bed mobility training, gait training, home exercise program, patient/family education, ROM (range of motion), stair training, strengthening, stretching, transfer training  To provide physical therapy services (P) other (see comments) (1-3x/day Monday through Saturday)  for duration of (P) until discharge.    The risks/benefits of therapy have been discussed with the patient/caregiver and he/she is in agreement with the established plan of care.       Subjective & Objective     Past Medical History:   Diagnosis  Date    Anemia 12/29/2018    iron Deficiency     Aneurysm (CMS HCC)     Anxiety     Arthritis     Asthma 12/29/2018    childhood, no inhalers since    Back problem     Constipation     CPAP (continuous positive airway pressure) dependence     Disorder of liver     Fatty Liver    Eczema 12/29/2018    hands x 1 year- eczema- seen derm    Esophageal reflux 12/29/2018    controlled    Fibromyalgia     Headache     Heart murmur 12/29/2018    Reports she was told this in her 72s    Hx of transfusion 12/29/2018    denies rx    Hyperlipidemia     Hypertension     Hypothyroid     Migraine     Nausea with vomiting     Neck problem     Peripheral edema 12/29/2018    chronic left lower extremity swelling     Peripheral neuropathy     Peripheral vascular disease (CMS HCC)     left lower extremity     PONV (postoperative nausea and vomiting)     Pre-diabetes     Problems with swallowing     Shortness of breath 12/29/2018    DOE- several years    Sleep apnea 12/29/2018    doesn't wear CPAP     Thyroid disease     Type 2 diabetes mellitus (CMS HCC)     Diagnosed 06/2021, Hgba1c- ?    Wears glasses             Past Surgical History:   Procedure Laterality Date    ANKLE SURGERY Right     BLADDER SURGERY      CESAREAN SECTION      x 3    COLONOSCOPY      HX BACK SURGERY      lumbar fusion x2, rods, screws    HX CARPAL TUNNEL RELEASE Bilateral 2020    HX CESAREAN SECTION      HX HIP REPLACEMENT Bilateral     Right infected and revised    HX HYSTERECTOMY      HX UPPER ENDOSCOPY      LAMINECTOMY      fusion, x2    LEG SURGERY Left     x 3 , skin graft - from dog bite        10/08/22 1457   Rehab Session   Document Type evaluation   PT Visit Date 10/08/22  General Information   Patient Profile Reviewed yes   Pertinent History of Current Functional Problem Patient was admitted on 10/08/22 to undergo an 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 Order received for PT evaluation to begin on  DOS.   Medical Lines PIV Line   Respiratory Status nasal cannula   Existing Precautions/Restrictions weight bearing restriction;fall precautions;full code  (WBAT RLE)   Mutuality/Individual Preferences   Anxieties, Fears or Concerns Pain   Individualized Care Needs Physical Therapy, Pain Management   Living Environment   Lives With spouse   Living Arrangements house   Home Accessibility stairs to enter home   Functional Level Prior   Ambulation 0 - independent   Transferring 0 - independent   Toileting 0 - independent   Bathing 0 - independent   Dressing 0 - independent   Eating 0 - independent   Communication 0 - understands/communicates without difficulty   Prior Functional Level Comment Fully independent without an AD. Drives and accesses community. Works Community education officer.   Pre Treatment Status   Pre Treatment Patient Status Patient supine in bed;Call light within reach;Patient safety alarm activated   Programmer, multimedia Nurse   Communication Pre Treatment Comment cleared for PT evaluation   Cognitive Assessment/Interventions   Behavior/Mood Observations cooperative  (groggy/sleepy but able to remain alert during activity)   Orientation Status oriented x 4   Attention WNL/WFL   Follows Commands WNL   Pre- Treatment Vital Signs   Pre-Treatment Heart Rate (beats/min) 106   Pre SpO2 (%) 96   O2 Delivery Pre Treatment supplemental O2   Vitals Comment O2 via NC at 2 L/min, removed for activity. Had mild desaturation to 91% with activity. O2 reapplied following evaluation.   Pre-Treatment Pain   Pretreatment Pain Rating 5/10   Pre/Posttreatment Pain Comment right knee at rest, increased to 7/10 with activity   RUE Assessment   RUE Assessment WFL- Within Functional Limits   LUE Assessment   LUE Assessment WFL- Within Functional Limits   RLE Assessment   RLE Assessment X-Exceptions   RLE ROM Knee   Right Knee Flexion 66   Right Knee Extensor Lag 3   LLE Assessment   LLE Assessment WFL- Within Functional Limits    Trunk Assessment   Trunk Assessment WFL-Within Functional Limits   Mobility Assessment/Training   Additional Documentation Bed Mobility Assessment/Treatment (Group);Transfer Assessment/Treatment (Group);Gait Assessment/Treatment (Group)   Bed Mobility Assessment/Treatment   Comment assistance to move and support RLE   Scoot/Bridge Independence minimum assist (75% patient effort)   Sit to Supine, Independence minimum assist (75% patient effort)   Supine-Sit Independence minimum assist (75% patient effort)   Transfer Assessment/Treatment   Sit-Stand-Sit, Assist Device other (see comments)  (EOB, bedrail and arms of BSC)   Sit-Stand Independence minimum assist (75% patient effort)   Stand-Sit Independence contact guard assist   Toilet Transfer Assist Device walker, front wheeled   Toilet Transfer Independence minimum assist (75% patient effort)   Gait Assessment/Treatment   Total Distance Ambulated 5   Comment min cues for hand placement, technique and safety. Had dizziness and nausea but completed transfer to [facility].   Post Treatment Status   Post Treatment Patient Status Patient supine in bed;Call light within reach;Patient safety alarm activated   Support Present Post Treatment  Family present  (husband)   Medical illustrator Post Treatment Comment updated on her status with eval and about severe nausea.  Post-Treatment Vital Signs   Post-treatment Heart Rate (beats/min) 97   Post SpO2 (%) 96   O2 Delivery Post Treatment supplemental O2   Post-Treatment Pain   Posttreatment Pain Rating 5/10   Physical Therapy Clinical Impression   Assessment Patient was admitted for an elective right TKA. She had been doing well postoperatively with some pain, but with activity experienced significant nausea, dizziness and lightheadedness. She will benefit from continued PT services throughout her stay for further education and instruction following TKA protocol. She plans to return to home with  husband at discharge and will attend outpatient PT services.   Criteria for Skilled Therapeutic meets criteria   Impairments Found (describe specific impairments) arousal, attention, and cognition;gait, locomotion, and balance;muscle performance;ROM (range of motion);other (see comments)  (nausea and dizziness)   Functional Limitations in Following  self-care;home management;work;community/leisure   Rehab Potential good   Therapy Frequency other (see comments)  (1-3x/day Monday through Saturday)   Predicted Duration of Therapy Intervention (days/wks) until discharge   Anticipated Equipment Needs at Discharge (PT) front wheeled walker   Anticipated Discharge Disposition home with assist;home with outpatient services   Evaluation Complexity Justification   Patient History: Co-morbidity/factors that impact Plan of Care 1-2 that impact Plan of Care   Examination Components 4 or more Exam elements addressed   Presentation Stable: Uncomplicated, straight-forward, problem focused   Clinical Decision Making Low complexity   Evaluation Complexity Low complexity   Planned Therapy Interventions, PT Eval   Planned Therapy Interventions (PT) bed mobility training;gait training;home exercise program;patient/family education;ROM (range of motion);stair training;strengthening;stretching;transfer training   Physical Therapy Time and Intention   Total PT Minutes: 46       TOTAL KNEE ARTHROPLASTY (TKA) PHYSICAL THERAPY GOALS    1) AMBULATE 300 FEET WITH WALKER AND SBA/S.   2) TRANSFER BED TO CHAIR WITH SBA/S.   3) INCREASED STRENGTH TO 3+/5 KNEE EXTENSION.   4) INCREASED KNEE AAROM TO 0 TO 90 DEGREES.  5) NEGOTIATES TRAINING STEPS USING HANDRAIL AND SBA/S.       Physical Therapy Inpatient TKA D/C Criteria        PATIENT/CAREGIVER EDUCATION  Educated on exercise program? NO  Can successfully demonstrate exercise form? NO  Can demonstrate safe/effective assistance with functional mobility? YES     IMPAIRMENT BASED CRITERIA  Does the  patient demonstrate knee ROM of at least 8-75 degrees on the involved leg? NO  Does the patient demonstrate a minimum of 3+/5 quadricep strength? NO  Does the patient demonstrate the ability to maintain any weight bearing precautions? YES  Does the patient demonstrated sensation of all LE dermatomes? YES  Does the patient have adequate pain control of <4/10 for functional mobility at home? NO    FUNCTIONAL BASED CRITERIA  Does that patient demonstrate the ability to ambulate 80 feet with RW using CGA/SPV assistance? NO  Does the patient demonstrate the ability to perform bed mobility with no more than min assist? YES  Does the patient demonstrated the ability to sit to stand from the bed or chair with min assist? YES  Does the patient demonstrate the ability to walk up/down 3-5 stairs with min assist as required for home entry? NO    ROM  Extension Lag: 3 degrees  Flexion 66 degrees        INTERVENTION MINUTES: EVALUATION 22 minutes and THERAPEUTIC ACTIVITY 24 minutes    EVALUATION COMPLEXITY : CLINICAL DECISION MAKING OF LOW COMPLEXITY AS INDICATED BY PMH, PHYSICAL THERAPY ASSESSMENT OF MUSCULOSKELETAL  AND NEUROLOGICAL SYSTEMS AND ACTIVITY LIMITATIONS. CLINICAL PRESENTATION IS STABLE AND UNCOMPLICATED    Therapist:     Rollene Rotunda, PT  10/08/2022, 18:30

## 2022-10-08 NOTE — Anesthesia Postprocedure Evaluation (Signed)
Anesthesia Post Op Evaluation    Patient: Courtney [name]  Procedure(s):  MAKO ROBOTIC ASSISTED RIGHT TOTAL KNEE ARTHROPLASTY USING TRIATHLON IMPLANTS    Last Vitals:Temperature: 36.4 C (97.5 F) (10/08/22 1058)  Heart Rate: 85 (10/08/22 1132)  BP (Non-Invasive): (!) 177/92 (10/08/22 1132)  Respiratory Rate: 14 (10/08/22 1132)  SpO2: 99 % (10/08/22 1132)    No notable events documented.      Patient location during evaluation: bedside       Patient participation: complete - patient participated  Level of consciousness: awake and alert    Pain management: satisfactory to patient  Airway patency: patent    Anesthetic complications: no  Cardiovascular status: hemodynamically stable  Respiratory status: acceptable  Hydration status: acceptable  Patient post-procedure temperature: Pt Normothermic   PONV Status: Absent

## 2022-10-08 NOTE — Nurses Notes (Signed)
Patient arrived to unit via stretcher, sleepy and arousable, she verbalized she was comfortable after placed in bed. Call bell in reach, bed in low position, foot pumps and bed alarm initiated.

## 2022-10-08 NOTE — OR Nursing (Signed)
DRUG ALLERGIES VERIFIED? YES  PATIENT IDENTIFIED? YES  CONSENT AND SIDE VERIFIED? PER PT & chart  PICTURES TAKEN? YES  IRRISEPT SOLUTION USED INTRAOP   MEASUREMENTS  THIGH-171/2 in   CALF--15in

## 2022-10-08 NOTE — Anesthesia Transfer of Care (Signed)
ANESTHESIA TRANSFER OF CARE   Courtney [name] is a 56 y.o. ,female, Weight: 80.3 kg (177 lb)   had Procedure(s):  MAKO ROBOTIC ASSISTED RIGHT TOTAL KNEE ARTHROPLASTY USING TRIATHLON IMPLANTS  performed  10/08/22   Primary Service: Quenton Fetter, DO    Past Medical History:   Diagnosis Date   . Anemia 12/29/2018    iron Deficiency    . Aneurysm (CMS HCC)    . Anxiety    . Arthritis    . Asthma 12/29/2018    childhood, no inhalers since   . Back problem    . Constipation    . CPAP (continuous positive airway pressure) dependence    . Disorder of liver     Fatty Liver   . Eczema 12/29/2018    hands x 1 year- eczema- seen derm   . Esophageal reflux 12/29/2018    controlled   . Fibromyalgia    . Headache    . Heart murmur 12/29/2018    Reports she was told this in her 51s   . Hx of transfusion 12/29/2018    denies rx   . Hyperlipidemia    . Hypertension    . Hypothyroid    . Migraine    . Nausea with vomiting    . Neck problem    . Peripheral edema 12/29/2018    chronic left lower extremity swelling    . Peripheral neuropathy    . Peripheral vascular disease (CMS HCC)     left lower extremity    . PONV (postoperative nausea and vomiting)    . Pre-diabetes    . Problems with swallowing    . Shortness of breath 12/29/2018    DOE- several years   . Sleep apnea 12/29/2018    doesn't wear CPAP    . Thyroid disease    . Type 2 diabetes mellitus (CMS HCC)     Diagnosed 06/2021, Hgba1c- ?   Marland Kitchen Wears glasses       Allergy History as of 10/08/22       MORPHINE         Noted Status Severity Type Reaction    06/08/17 1506 Rhodia Albright, RN 08/30/14 Active Low  Swelling    08/30/14 1056 Faith Rogue 08/30/14 Active                 NITROFURANTOIN         Noted Status Severity Type Reaction    06/08/17 1506 Rhodia Albright, RN 08/30/14 Active High  Shortness of Breath    08/30/14 1056 Faith Rogue 08/30/14 Active                 OTHER         Noted Status Severity Type Reaction    12/29/18 1649 Alderson, [place],  RN 06/08/17 Deleted       Comments: Patient states an anti nausea medication caused her to "keep throwing up" after receiving it. She doesn't remember the name of this medications but states "Zofran sounds familiar"      06/08/17 1515 Rhodia Albright, RN 06/08/17 Active       Comments: Patient states an anti nausea medication caused her to "keep throwing up" after receiving it. She doesn't remember the name of this medications but states "Zofran sounds familiar"                ONDANSETRON         Noted Status Severity Type Reaction    12/29/18  1648 Alderson, Colin Mulders, RN 12/29/18 Active Low  Nausea/ Vomiting              NITROFURANTOIN MONOHYD/M-CRYST         Noted Status Severity Type Reaction    05/01/21 0950 Felipe Drone 05/01/21 Active                     I completed my transfer of care / handoff to the receiving personnel during which we discussed:  Access, Airway, All key/critical aspects of case discussed, Analgesia, Antibiotics, Expectation of post procedure, Fluids/Product, Gave opportunity for questions and acknowledgement of understanding, Labs and PMHx      Post Location: PACU                                                           Last OR Temp: Temperature: 36.3 C (97.4 F)  ABG:  POTASSIUM   Date Value Ref Range Status   12/29/2018 3.6 3.5 - 5.1 mmol/L Final     CALCIUM   Date Value Ref Range Status   12/29/2018 9.2 8.5 - 10.2 mg/dL Final     Calculated P Axis   Date Value Ref Range Status   12/29/2018 53 degrees Final     Calculated R Axis   Date Value Ref Range Status   12/29/2018 39 degrees Final     Calculated T Axis   Date Value Ref Range Status   12/29/2018 53 degrees Final     Airway:* No LDAs found *  Blood pressure (!) 151/66, pulse 75, temperature 36.3 C (97.4 F), resp. rate 16, height 1.626 m (5\' 4" ), weight 80.3 kg (177 lb), SpO2 97%.

## 2022-10-08 NOTE — Interval H&P Note (Signed)
H & P updated the day of the procedure.  1.  H&P completed within 30 days of surgical procedure and has been reviewed within 24 hours of admission but prior to surgery or a procedure requiring anesthesia services, the patient has been examined, and no change has occured in the patients condition since the H&P was completed.       Change in medications: No        No LMP recorded.      Comments:     2.  Patient continues to be appropriate candidate for planned surgical procedure. YES    Brandolyn Shortridge, DO

## 2022-10-08 NOTE — Nurses Notes (Signed)
Dr Smitty Cords contacted due to patient allergy to Zofran with current nausea vomiting. I received a new order for compazine.

## 2022-10-08 NOTE — H&P (Signed)
Paper H and P on chart, will be scanned to EMR.

## 2022-10-08 NOTE — OR Surgeon (Signed)
[facility]  Operative Note     PATIENT NAME:  Courtney, [name]  MRN:  [id number]  DOB:  [birth year]    Date of Procedure:  10/08/2022  Preoperative Diagnosis: OSTEOARTHRITIS RIGHT KNEE   Postoperative Diagnoses:  OSTEOARTHRITIS RIGHT KNEE   Procedure Performed: ProcProcedure(s) (LRB):  MAKO ROBOTIC ASSISTED RIGHT TOTAL KNEE ARTHROPLASTY USING TRIATHLON IMPLANTS (Right)   Implants:  Implant Name Type Inv. Item Serial No. Manufacturer Lot No. LRB No. Used Action   PATEL  TRIT METAL ASYM TRIATH KNEE COM STRL - [id number]  PATEL  TRIT METAL ASYM TRIATH KNEE COM STRL  [employer] [id number] Right 1 Implanted   BASEPLATE TIB TRIATH 4 KNEE TRIT - [id number]  BASEPLATE TIB TRIATH 4 KNEE TRIT  [employer] [id number] Right 1 Implanted   INSERT TIB BRNG CRCTE RTN 4 TRIATH X3 KNEE STRL LF - [id number]  INSERT TIB BRNG CRCTE RTN 4 TRIATH X3 KNEE STRL LF  [employer] [id number] Right 1 Implanted   FEM 4 PA CRCTE RTN BEAD TRIATH KNEE RGT COM STRL LF - [id number]  FEM 4 PA CRCTE RTN BEAD TRIATH KNEE RGT COM STRL LF  [employer] [id number] Right 1 Implanted      [employer]    Surgeon: Quenton Fetter, DO   Anesthesia: Anesthesiologist: Delle Reining, MD  CRNA: Bobette Mo, CRNA   OR Staff: Circulator: Carlton Adam, RN  PERIOPERATIVE CARE ASSISTANT: Avel Peace, PCA  Scrub Person: Peggye Fothergill, ST  Scrub First Assist: Guilford Shi, CST   Anticoagulation: ASA   Estimated Blood Loss: * No blood loss amount entered *   Specimens: * No specimens in log *   Complications: None immediate  Indications For Procedure:  Courtney [name]  is a very pleasant  57 y.o. female  presenting  for OSTEOARTHRITIS RIGHT KNEE   End stage arthritis interfering with ADL and failing multiple non-surgical treatments.  Discussed robotic technology, thromoboembolism, infection and risks, benefits, indications, and complications of procedure. Informed signed consent  obtained.    DESCRIPTION OF THE PROCEDURE:  Patient identified and time out completed with entire surgical team.  Antibiotics and tranexamic acid administered prior to inflation of torniquet.  Satisfactory anesthesia obtained. Tourniquet was placed over padding on the thigh.   The leg was prepped with DuraPrep and draped in a sterile manner.  Elevation exsanguination, tourniquet to 350 mmHg.    Anterior approach to the knee through medial parapatellar arthrotomy, the knee exposed.  We placed the femoral array inside the incision.  Tibial array outside. The femoral and tibial markers were placed and we successfully registered the knee. The existing components were removed and robotic plan for bone cuts and balancing completed in flexion and extension.  The robot was then utilized to perform all cuts.  Posterior osteophytes were removed from the femur.   Patella was resected at the reflection of the patellar ligament.   Trials were positioned with excellent extension and flexion with good rotation, alignment and stability verified robotically. The patellar component was implanted and the component was verified to be well fixed.  Patellar tracking was evaluated and found to be satisfactory. Knee was copiously irrigated with pulsatile lavage, then one-minute soak with Irrisept solution.  The tibial baseplate impacted into position.  The knee then taken through range of motion with full extension and flexion with good rotation, alignment and stability, balance and ROM again verified with MAKO robotics.    Wound was irrigated.  The arthrotomy was closed in interrupted fashion.  The wound was closed in layers finishing with subcuticular stitch on the skin.  Sterile bandage was applied, and the tourniquet was released.  The patient was awoken from anesthesia and taken to recovery room under the supervision of anesthesia.  Marcha Dutton, DO

## 2022-10-09 LAB — CBC WITH DIFF
BASOPHIL #: 0 10*3/uL (ref 0.00–0.10)
BASOPHIL %: 0 % (ref 0–1)
EOSINOPHIL #: 0 10*3/uL (ref 0.00–0.50)
EOSINOPHIL %: 0 % — ABNORMAL LOW
HCT: 35.9 % (ref 31.2–41.9)
HGB: 12 g/dL (ref 10.9–14.3)
LYMPHOCYTE #: 0.8 10*3/uL — ABNORMAL LOW (ref 1.00–3.00)
LYMPHOCYTE %: 5 % — ABNORMAL LOW (ref 16–44)
MCH: 28.8 pg (ref 24.7–32.8)
MCHC: 33.5 g/dL (ref 32.3–35.6)
MCV: 86 fL (ref 75.5–95.3)
MONOCYTE #: 0.7 10*3/uL (ref 0.30–1.00)
MONOCYTE %: 4 % — ABNORMAL LOW (ref 5–13)
MPV: 8 fL (ref 7.9–10.8)
NEUTROPHIL #: 14.9 10*3/uL — ABNORMAL HIGH (ref 1.85–7.80)
NEUTROPHIL %: 91 % — ABNORMAL HIGH (ref 43–77)
PLATELET COMMENT: NORMAL
PLATELETS: 219 10*3/uL (ref 140–440)
RBC COMMENT: NORMAL
RBC: 4.17 10*6/uL (ref 3.63–4.92)
RDW: 14.2 % (ref 12.3–17.7)
WBC: 16.4 10*3/uL — ABNORMAL HIGH (ref 3.8–11.8)

## 2022-10-09 LAB — BASIC METABOLIC PANEL
ANION GAP: 10 mmol/L (ref 4–13)
BUN/CREA RATIO: 15 (ref 6–22)
BUN: 9 mg/dL (ref 7–25)
CALCIUM: 9 mg/dL (ref 8.6–10.3)
CHLORIDE: 103 mmol/L (ref 98–107)
CO2 TOTAL: 25 mmol/L (ref 21–31)
CREATININE: 0.62 mg/dL (ref 0.60–1.30)
ESTIMATED GFR: 104 mL/min/{1.73_m2} (ref >59)
GLUCOSE: 148 mg/dL — ABNORMAL HIGH (ref 74–109)
OSMOLALITY, CALCULATED: 277 mOsm/kg (ref 270–290)
POTASSIUM: 3.9 mmol/L (ref 3.5–5.1)
SODIUM: 138 mmol/L (ref 136–145)

## 2022-10-09 MED ORDER — ASPIRIN 81 MG CHEWABLE TABLET
81.0000 mg | CHEWABLE_TABLET | Freq: Two times a day (BID) | ORAL | Status: AC
Start: 2022-10-09 — End: 2022-11-08

## 2022-10-09 MED ORDER — OXYCODONE-ACETAMINOPHEN 5 MG-325 MG TABLET: 1.0000 | ORAL_TABLET | Freq: Four times a day (QID) | ORAL | 0 refills | Status: DC | PRN

## 2022-10-09 MED ORDER — HYDROCODONE 7.5 MG-ACETAMINOPHEN 325 MG TABLET
1.0000 | ORAL_TABLET | Freq: Four times a day (QID) | ORAL | 0 refills | Status: DC | PRN
Start: 2022-10-09 — End: 2022-10-09

## 2022-10-09 NOTE — PT Treatment (Signed)
[facility]  [address]  [place], [address]  [phone]  (Fax) [phone]  Rehabilitation Department  Physical Therapy Daily Inpatient TKA Note    Date: 10/09/2022  Patient's Name: Courtney [name]  Date of Birth: 01/01/1966  Height: Height: 162.6 cm (5\' 4" )  Weight: Weight: 80.3 kg (177 lb)      Plan: Will continue under current POC.         Subjective/Objective/Assessment:  Flowsheet    10/09/22 0836   Rehab Session   Document Type therapy progress note (daily note)   PT Visit Date 10/09/22   General Information   Patient Profile Reviewed yes   Medical Lines PIV Line   Existing Precautions/Restrictions fall precautions   Pre Treatment Status   Pre Treatment Patient Status Patient sitting in bedside chair or w/c   Communication Pre Treatment  Charge Nurse   Communication Pre Treatment Comment cleared for PT   Pre-Treatment Pain   Pre/Posttreatment Pain Comment right knee   Transfer Assessment/Treatment   Sit-Stand Independence contact guard assist   Stand-Sit Independence contact guard assist   Sit-Stand-Sit, Assist Device walker, front wheeled   Transfer Impairments pain;other (see comments)  (nausea)   Gait Assessment/Treatment   Total Distance Ambulated 340   Independence  contact guard assist   Assistive Device  walker, front wheeled   Impairments  endurance;pain;strength decreased   Comment patient had good step thru gait, no lob,   Stairs Assessment/Treatment   Number of Stairs 4   Handrail Location both sides   Independence Level contact guard assist   Balance Skill Training   Sitting Balance: Static good balance   Sitting, Dynamic (Balance) good balance   Sit-to-Stand Balance fair + balance   Standing Balance: Static fair + balance   Therapeutic Exercise   Comment patient particpated with group therapy, did well with exs, reviewed walking,icing, and positioning to prevent excess swelling, allquestions answered at home   Post Treatment Status   Post  Treatment Patient Status Patient sitting in bedside chair or w/c;Patient safety alarm activated   Support Present Post Treatment  Family present   Communication Post Treatement Nurse   Post-Treatment Pain   Posttreatment Pain Rating 7/10   Cognitive Assessment/Intervention   Behavior/Mood Observations behavior appropriate to situation, WNL/WFL   Physical Therapy Time and Intention   Total PT Minutes: 68     Patient in the chair, gaited with rw 341ft, did well with ex program for home  ROM 5-70          Physical Therapy Inpatient TKA D/C Criteria        PATIENT/CAREGIVER EDUCATION  Educated on exercise program? YES  Can successfully demonstrate exercise form? YES  Can demonstrate safe/effective assistance with functional mobility? YES     IMPAIRMENT BASED CRITERIA  Does the patient demonstrate knee ROM of at least 8-75 degrees on the involved leg? YES  Does the patient demonstrate a minimum of 3+/5 quadricep strength? YES  Does the patient demonstrate the ability to maintain any weight bearing precautions? YES  Does the patient demonstrated sensation of all LE dermatomes? YES  Does the patient have adequate pain control of <4/10 for functional mobility at home? YES    FUNCTIONAL BASED CRITERIA  Does that patient demonstrate the ability to ambulate 80 feet with RW using CGA/SPV assistance? YES  Does the patient demonstrate the ability to perform bed mobility with no more than min assist? YES  Does the patient demonstrated the  ability to sit to stand from the bed or chair with min assist? YES  Does the patient demonstrate the ability to walk up/down 3-5 stairs with min assist as required for home entry? YES    ROM  Extension 5  Flexion 70      THERAPIST  Lane Hacker, PTA  10/09/2022, 12:11       Intervention minutes: GROUP THERAPEUTIC EXERCISE 55 MINUTES and GAIT TRAINING [address] MINUTES    THERAPIST  Lane Hacker, PTA  10/09/2022, 12:11

## 2022-10-09 NOTE — Nurses Notes (Signed)
Pt has slept most of the shift.given compazine IV with relief.

## 2022-10-09 NOTE — OT Evaluation (Signed)
[facility]  [address]  [place], [address]  ([phone]  (Fax) [phone]  Rehabilitation Services  Occupational Therapy Inpatient Initial Evaluation      Patient Name: Courtney [name]  Date of Birth: [birth year]  Height: Height: 162.6 cm (5\' 4" )  Weight: Weight: 80.3 kg (177 lb)  Room/Bed: 381/A  Payor: PEIA / Plan: PEIA/UMR / Product Type: Non Managed Care /         PMH:   Past Medical History:   Diagnosis Date    Anemia 12/29/2018    iron Deficiency     Aneurysm (CMS HCC)     Anxiety     Arthritis     Asthma 12/29/2018    childhood, no inhalers since    Back problem     Constipation     CPAP (continuous positive airway pressure) dependence     Disorder of liver     Fatty Liver    Eczema 12/29/2018    hands x 1 year- eczema- seen derm    Esophageal reflux 12/29/2018    controlled    Fibromyalgia     Headache     Heart murmur 12/29/2018    Reports she was told this in her 6s    Hx of transfusion 12/29/2018    denies rx    Hyperlipidemia     Hypertension     Hypothyroid     Migraine     Nausea with vomiting     Neck problem     Peripheral edema 12/29/2018    chronic left lower extremity swelling     Peripheral neuropathy     Peripheral vascular disease (CMS HCC)     left lower extremity     PONV (postoperative nausea and vomiting)     Pre-diabetes     Problems with swallowing     Shortness of breath 12/29/2018    DOE- several years    Sleep apnea 12/29/2018    doesn't wear CPAP     Thyroid disease     Type 2 diabetes mellitus (CMS HCC)     Diagnosed 06/2021, Hgba1c- ?    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  OT orders received, evaluation and ADL training completed.  Patient did not require use of adaptive equipment for lower body ADLs, minimal extra time and reported she had adaptive equipment from pre surgeries to utilize as needed  for pain management, independent.  Spouse was present and supportive throughout ADL training.  Patient demonstrated ability to ambulate with FWW mod I, OT providing 1 verbal cue for safety with sidestepping when entering/exiting bathroom and patient was able to utilize grab bar on the wall while completing sit, sit-to-stand.  Independent with toileting and toileting hygiene.  Patient is scheduled for discharge home today with family assist.    Discharge Needs:   Equipment Recommendation:  ELASTIC SHOE LACES    The patient presents with mobility limitations due to impaired range of motion that significantly impair/prevent patient's ability to participate in mobility-related activities of daily living (MRADLs) including  ambulation and transfers in order to safely complete, bathing, food preparation, working, laundering/household tasks. This functional mobility deficit can be sufficiently resolved with the use of a Anticipated Equipment Needs at Discharge: (P) none anticipated  in order to decrease the risk of falls, morbidity, and mortality in performance of these MRADLs.  Patient is able to safely use this assistive device.    Discharge Disposition:      JUSTIFICATION OF DISCHARGE RECOMMENDATION   Based on current diagnosis, functional performance prior to admission, and current functional performance, this patient requires continued OT services in Anticipated Discharge Disposition: (P) home with assist  in order to achieve significant functional improvements.    Plan:   Current Intervention:  Predicted Duration of Therapy: (P) evaluation only    To provide Occupational therapy services  Therapy Frequency: (P) Evaluation Only for duration of Predicted Duration of Therapy: (P) evaluation only  .       The risks/benefits of therapy have been discussed with the patient/caregiver and he/she is in agreement with the established plan of care.       Subjective & Objective     MEDICAL HISTORY:   Past Medical History:    Diagnosis Date    Anemia 12/29/2018    iron Deficiency     Aneurysm (CMS HCC)     Anxiety     Arthritis     Asthma 12/29/2018    childhood, no inhalers since    Back problem     Constipation     CPAP (continuous positive airway pressure) dependence     Disorder of liver     Fatty Liver    Eczema 12/29/2018    hands x 1 year- eczema- seen derm    Esophageal reflux 12/29/2018    controlled    Fibromyalgia     Headache     Heart murmur 12/29/2018    Reports she was told this in her 46s    Hx of transfusion 12/29/2018    denies rx    Hyperlipidemia     Hypertension     Hypothyroid     Migraine     Nausea with vomiting     Neck problem     Peripheral edema 12/29/2018    chronic left lower extremity swelling     Peripheral neuropathy     Peripheral vascular disease (CMS HCC)     left lower extremity     PONV (postoperative nausea and vomiting)     Pre-diabetes     Problems with swallowing     Shortness of breath 12/29/2018    DOE- several years    Sleep apnea 12/29/2018    doesn't wear CPAP     Thyroid disease     Type 2 diabetes mellitus (CMS HCC)     Diagnosed 06/2021, Hgba1c- ?    Wears glasses          SURGICAL HISTORY:   Past Surgical History:   Procedure Laterality Date    ANKLE SURGERY Right     BLADDER SURGERY      CESAREAN SECTION      x 3    COLONOSCOPY      HX BACK SURGERY      lumbar fusion x2, rods, screws    HX CARPAL TUNNEL RELEASE Bilateral 2020    HX CESAREAN SECTION      HX HIP REPLACEMENT Bilateral     Right infected and revised    HX HYSTERECTOMY      HX UPPER ENDOSCOPY      LAMINECTOMY      fusion, x2    LEG SURGERY Left     x 3 , skin graft - from dog bite  ipp    INSERT FLOW SHEET     10/09/22 1036   Rehab Session   Document Type evaluation   OT Visit Date 10/09/22   Total OT Minutes: 24   Patient Effort excellent   General Information   Patient Profile Reviewed yes   Onset of Illness/Injury or Date of Surgery 10/08/22   Medical Lines PIV Line   Respiratory Status room air   Existing  Precautions/Restrictions fall precautions;full code   Pre Treatment Status   Pre Treatment Patient Status Patient standing at bedside;Call light within reach;Telephone within reach;Nurse approved session   Support Present Pre Treatment    (Spouse arrived shortly after OT began evaluation)   Communication Pre Treatment  Charge Nurse   Communication Pre Treatment Comment Cleared for OT   Mutuality/Individual Preferences   Anxieties, Fears or Concerns None stated by patient   Plan of Care Reviewed With patient;spouse   Living Environment   Living Environment Comment Patient reports living with her spouse in a 1 level home, 7 steps and 2 rails for entrance/exit.  Patient has WIS, shower chair, BSC, FWW and straight cane.  Patient is still drives and works full-time in [employer].  Spouse present and supportive throughout evaluation and ADL training   Functional Level Prior   Ambulation 1 - assistive equipment   Transferring 1 - assistive equipment   Toileting 0 - independent   Bathing 0 - independent   Dressing 0 - independent   Eating 0 - independent   Communication 0 - understands/communicates without difficulty   Swallowing 0-->swallows foods/liquids without difficulty   Self-Care   Dominant Hand right   Usual Activity Tolerance excellent   Current Activity Tolerance good   Important Activities family;hobbies;social;spiritual   Vital Signs   Vitals Comment Patient no longer connected to monitoring systems   Pain Assessment   Pretreatment Pain Rating 2/10   Posttreatment Pain Rating 3/10   Pre/Posttreatment Pain Comment Right knee   Coping/Psychosocial   Observed Emotional State calm;cooperative;pleasant   Verbalized Emotional State acceptance   Family/Support System   Family/Support Persons spouse   Involvement in Care at bedside;attentive to patient;interacting with patient;participating in care;supportive of patient   Cognitive Assessment/Interventions   Behavior/Mood Observations behavior appropriate to  situation, WNL/WFL   Orientation Status oriented x 4   Attention WNL/WFL   Follows Commands WNL   Comment Patient able to follow OT verbal cues for safety with sidestepping with FWW when entering/exiting bathroom   Vision Assessment/Interventions   Visual Impairment/Limitations WFL;WFL with corrective lenses   RUE Assessment   RUE Assessment WFL- Within Functional Limits   LUE Assessment   LUE Assessment WFL- Within Functional Limits   Trunk Assessment   Trunk Assessment WFL-Within Functional Limits   Musculoskeletal   RLE Weight-Bearing Status weight-bearing as tolerated   Grip Strength   Grip Left (4/5) good, left   Right Grip (5/5) normal, right   Transfer Assessment/Treatment   Sit-Stand Independence modified independence   Stand-Sit Independence modified independence   Sit-Stand-Sit, Assist Device walker, front wheeled   Toilet Transfer Independence modified independence   Toilet Transfer Assist Device grab bar   ADL Assessment/Intervention   ADL Comments Patient able to ambulate modified independent using FWW with OT providing only 1 verbal cue for sidestepping with walker in narrow areas around her hospital bed to/from her bathroom.  Patient able to demonstrate lower body ADLs without use of adaptive equipment requiring extra time.  OT recommended elastic laces for her shoes  as she reports shoes that she was wearing aid her neuropathy.  She also reported that she has adaptive equipment from previous left and right total hip arthroplasties such as long-handled bath sponge, shoe horn, reacher, sock aid to use as needed for pain management, independent during postop rehab phase.   Post Treatment Status   Post Treatment Patient Status Patient sitting in bedside chair or w/c;Call light within reach;Telephone within reach   Support Present Post Treatment  Family present   Communication Post Treatement Nurse   Communication Post Treatment Comment Evaluation and ADL training completed.  Patient ready for discharge  home today with family assist.   Planned Therapy Interventions, OT Eval   Planned Therapy Interventions ADL retraining;transfer training   Clinical Impression   Criteria for Skilled Therapeutic Interventions Met (OT) yes;meets criteria;skilled treatment is necessary   Rehab Potential good   Therapy Frequency Evaluation Only   Predicted Duration of Therapy evaluation only   Anticipated Equipment Needs at Discharge none anticipated   Anticipated Discharge Disposition home with assist   Evaluation Complexity Justification   Occupational Profile Review Brief history   Performance Deficits 1-3 deficits   Clinical Decision Making Low analytic complexity   Evaluation Complexity Low       TREATMENT PLAN: ADL/IADL TRAINING, EDUCATION, and DME/AE TRAINING  EVALUATION COMPLEXITY: CLINICAL DECISION MAKING OF LOW COMPLEXITY AS INDICATED BY PMH, OCCUPATIONAL THERAPY ASSESSMENT OF MUSCULOSKELETAL AND NEUROLOGICAL SYSTEMS AND ACTIVITY LIMITATIONS. CLINICAL PRESENTATION IS STABLE AND UNCOMPLICATED.      EVALUATION 10 minutes and ADL/IADL TRAINING 14 minutes    Therapist:      Hillery Jacks, OT,10/09/2022 12:58

## 2022-10-09 NOTE — Nurses Notes (Signed)
Drsg removed dry and intact measuring 19/15/13.

## 2022-10-09 NOTE — Discharge Summary (Signed)
[facility]  DISCHARGE SUMMARY    PATIENT NAME:  Courtney [name], Courtney [name]  MRN:  [id number]  DOB:  1965/10/01    ENCOUNTER DATE:  10/08/2022  INPATIENT ADMISSION DATE: 10/08/2022  DISCHARGE DATE:  10/09/2022    ATTENDING PHYSICIAN: Quenton Fetter, DO  SERVICE: [facility]  PRIMARY CARE PHYSICIAN: Boneta Lucks, MD       No lay caregiver identified.    PRIMARY DISCHARGE DIAGNOSIS: Osteoarthritis  Active Hospital Problems    Diagnosis Date Noted    Principal Problem: Osteoarthritis [M19.90] 10/08/2022      Resolved Hospital Problems   No resolved problems to display.     Active Non-Hospital Problems    Diagnosis Date Noted    Lumbago 01/15/2017    Low back pain 01/15/2017    Lumbar radiculopathy 01/15/2017    Numbness and tingling 01/15/2017    S/P lumbar fusion 01/15/2017    Lumbar stenosis 01/15/2017    Spondylolisthesis, unspecified spinal region 01/15/2017    Tight posterior capsule of right shoulder 08/30/2014           Current Discharge Medication List        CONTINUE these medications which have CHANGED during your visit.        Details   sumatriptan succinate 100 mg Tablet  Commonly known as: IMITREX  What changed:   how much to take  how to take this  when to take this  reasons to take this   May repeat in 2 hours in needed  Qty: 30 Tablet  Refills: 5            CONTINUE these medications - NO CHANGES were made during your visit.        Details   amitriptyline 25 mg Tablet  Commonly known as: ELAVIL   25 mg, Oral, NIGHTLY  Refills: 0     cetirizine 10 mg Tablet  Commonly known as: zyrTEC   10 mg, Oral, DAILY  Refills: 0     ergocalciferol (vitamin D2) 1,250 mcg (50,000 unit) Capsule  Commonly known as: DRISDOL   Take 1 Capsule (50,000 Units total) by mouth Every 7 days  Refills: 0     fluocinolone 0.025 % Cream  Commonly known as: SYNALAR   1 Application, Apply Topically, DAILY PRN  Refills: 0     Levothyroxine 75 mcg Capsule   75 mcg, Oral, DAILY  Refills: 0     losartan 100 mg  Tablet  Commonly known as: COZAAR   100 mg, Oral, DAILY  Refills: 0     pantoprazole 40 mg Tablet, Delayed Release (E.C.)  Commonly known as: PROTONIX   40 mg, Oral, DAILY  Refills: 0     Tylenol Arthritis Pain 650 mg Tablet Sustained Release  Generic drug: acetaminophen   650 mg, Oral, EVERY 8 HOURS PRN  Refills: 0            STOP taking these medications.      Mounjaro 2.5 mg/0.5 mL Pen Injector  Generic drug: tirzepatide     Mounjaro 7.5 mg/0.5 mL Pen Injector  Generic drug: tirzepatide            Discharge med list refreshed?  YES     Allergies   Allergen Reactions    Macrodantin [Nitrofurantoin] Shortness of Breath    Macrobid [Nitrofurantoin Monohyd/M-Cryst]     Morphine Swelling     Facial swelling    Zofran [Ondansetron] Nausea/ Vomiting     HOSPITAL PROCEDURE(S):  No orders of the defined types were placed in this encounter.    Surgical/Procedural Cases on this Admission       Case IDs Date Procedure Surgeon Location Status    [id number] 10/08/22 MAKO ROBOTIC ASSISTED RIGHT TOTAL KNEE ARTHROPLASTY USING TRIATHLON IMPLANTS Quenton Fetter, DO [facility]          REASON FOR HOSPITALIZATION AND HOSPITAL COURSE   BRIEF HPI:  This is a 57 y.o., female admitted for Elective TKR.  BRIEF HOSPITAL NARRATIVE:     Uncomplicated course.  WBC elevated, no clinical indication of infection, posibbly related to steroid.  TRANSITION/POST DISCHARGE CARE/PENDING TESTS/REFERRALS: OPPT,  PCP    CONDITION ON DISCHARGE:  A. Ambulation: Ambulation with assistive device  B. Self-care Ability: With partial assistance  C. Cognitive Status Alert and Oriented x 3  D. Code status at discharge:       LINES/DRAINS/WOUNDS AT DISCHARGE:   Patient Lines/Drains/Airways Status       Active Line / Dialysis Catheter / Dialysis Graft / Drain / Airway / Wound       Name Placement date Placement time Site Days    Peripheral IV Left Basilic  (medial side of arm) 10/08/22  0701  -- 1    Wound  Incision Right Knee 10/08/22  --  -- 1                     DISCHARGE DISPOSITION:  Home discharge and Outpatient PT   DISCHARGE INSTRUCTIONS:  Post-Discharge Follow Up Appointments       Follow up with Boneta Lucks, MD in 1 week(s)    Phone: [phone]    Where: [address], [place] [address]      Thursday Oct 23, 2022    Follow up with Quenton Fetter, DO    Phone: [phone]    Where: [facility]             PT EVALUATE AND TREAT- OUTPATIENT     Need to be addressed: EVALUATE AND TREAT    Reason: post op      DME - WALKER Front Wheeled    Please note - If patient is 300 lbs or greater please order bariatric or heavy duty items.     Patient has mobility limits that significantly impairs ability to participate in one or more mobility related ADL's (MRADL's): Yes    Moblity Limitations: Pt at heightened risk of injury r/t attempts to fulfill MRADL's & can safely use walker which resolves issue    Walker Type: Front Wheeled    Freedom of Choice: I have informed patient of their freedom of choice with respect to DME providers      DME - BEDSIDE COMMODE    A standard commode is covered when the patient is physically incapable of utilizing regular toilet facilities.  An extra wide/heavy duty commode chair is covered for a patient who weighs 300 pounds or more.     I certify that a bedside commode is necessary due to Patient is confined to a single room    Bedside Commode Type Standard Bedside Commode    Freedom of Choice: I have informed patient of their freedom of choice with respect to DME providers           Alexia Freestone, MD    Copies sent to Care Team         Relationship Specialty Notifications Start End    Boneta Lucks,  MD PCP - General EXTERNAL  05/16/20     Phone: [phone] Fax: [phone]         [address] [facility] [address]            Referring providers can utilize [email or web address] to access their referred [facility] patient's information.

## 2022-10-09 NOTE — Nurses Notes (Signed)
Printed AVS teaching done with Jake and husband who is present at bedside. Teaching done on: prescriptions and aspirin to be bought over the counter, 1 paper prescription given, signs and symptoms of DVT/infection, dressing instructions, and Nozin given with paper instructions. Pt educated on reasons to contact MD, reasons to contact 911. Buena verbalized understandment. Cryo machine taken, education done. Wheelchair to lobby

## 2022-10-10 NOTE — Care Management Notes (Signed)
[facility]  Care Management Initial Evaluation    Patient Name: Courtney [name]  Date of Birth: December 20, 1965  Sex: female  Date/Time of Admission: 10/08/2022  6:16 AM  Room/Bed: 381/A  Payor: PEIA / Plan: PEIA/UMR / Product Type: Non Managed Care /   Primary Care Providers:  Boneta Lucks, MD, MD (General)    Pharmacy Info:   Preferred Pharmacy       [facility] - [place], [place] - [address]    [address] [place] [address]    Phone: [phone] Fax: [phone]    Hours: Not open 24 hours    [facility]    [address] [place] [address]    Phone: [phone] Fax: [phone]    Hours: 24/7          Emergency Contact Info:   Extended Emergency Contact Information  Primary Emergency Contact: Blane Ohara  Address: [address]           [place], [place] [address] [place]  Work Phone: [phone]  Mobile Phone: [phone]  Relation: Significant other  Secondary Emergency Contact: SNEDEGAR, JEFFREY   [place] [employer] Phone: [phone]  Relation: Son    History:   Ilany Tardie is a 57 y.o., female, admitted 10/08/22.    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 No   Insurance Information/Type   Insurance type Commercial   Employment/Financial   Patient has Prescription Coverage?  Yes        Name of Insurance Coverage for Medications PEIA   Financial Concerns none   Living Environment   Lives With significant other   Living Arrangements house   Able to Return to Prior Arrangements yes   IEP and/or 504 Plan? No   Home Safety   Home Assessment: No Problems Identified   Home Accessibility stairs to enter home   Custody and  Legal Status   Do you have a court appointed guardian/conservator? No   Are you an emancipated minor? No   Custody Issues? No   Paternity Affidavit Requested? No   Care Management Plan   Discharge Planning Status initial meeting   Projected Discharge Date 10/09/22   Discharge plan discussed with: Patient   CM will evaluate for rehabilitation potential yes   Patient choice offered to patient/family yes   Facility or Agency Preferences [facility]   Discharge Needs Assessment   Outpatient/Agency/Support Group Needs other (see comments)  (Outpatient Physical Therapy)   Equipment Currently Used at Home walker, front wheeled;commode   Equipment Needed After Discharge none   Discharge Facility/Level of Care Needs Home (Patient/Family Member/other)(code 1)   Transportation Available car;family or friend will provide   Referral Information   Admission Type observation   Arrived From home or self-care   Home Main Entrance   Number of Stairs, Main Entrance five     Pt admitted with total right knee arthroplasty. CM met with pt at bedside. Pt was alert and oriented to all spheres. Pt lives with her significant other and plans to return there upon discharge. Pt has  5 steps to get into their home and then it is all one level.  Pt will have Blane Ohara to assist in her care. Pt has a walker and a bedside commode. Pt would like to have outpatient physical therapy at [facility]. CM faxed referral to [facility] and pt is scheduled for 10/13/22 at 8:00 AM.     Discharge Plan:  Home (Patient/Family Member/other) (code 1)      The patient will continue to be evaluated for developing discharge needs.     Case Manager: Yaakov Guthrie, [place]  Phone: [phone]

## 2022-10-20 ENCOUNTER — Ambulatory Visit (INDEPENDENT_AMBULATORY_CARE_PROVIDER_SITE_OTHER): Payer: 59 | Admitting: Specialist

## 2023-02-23 ENCOUNTER — Telehealth (INDEPENDENT_AMBULATORY_CARE_PROVIDER_SITE_OTHER): Payer: Self-pay | Admitting: Podiatrist

## 2023-02-23 NOTE — Telephone Encounter (Signed)
Copied from CRM #[id number]. Topic: Clinical - Question  >> Feb 23, 2023 10:13 AM Donetta Potts wrote:  Courtney [name] called with a clinical question.   Pt is ready to have surgery on her L foot. She'd like to have it in December.   If she needs an office visit please advise when to schedule.   Thanks

## 2023-03-05 ENCOUNTER — Ambulatory Visit: Payer: 59 | Attending: Podiatrist | Admitting: Podiatrist

## 2023-03-05 ENCOUNTER — Other Ambulatory Visit: Payer: Self-pay

## 2023-03-05 ENCOUNTER — Encounter (INDEPENDENT_AMBULATORY_CARE_PROVIDER_SITE_OTHER): Payer: Self-pay | Admitting: Podiatrist

## 2023-03-05 DIAGNOSIS — G8929 Other chronic pain: Secondary | ICD-10-CM | POA: Insufficient documentation

## 2023-03-05 DIAGNOSIS — M205X2 Other deformities of toe(s) (acquired), left foot: Secondary | ICD-10-CM | POA: Insufficient documentation

## 2023-03-05 DIAGNOSIS — M79675 Pain in left toe(s): Secondary | ICD-10-CM | POA: Insufficient documentation

## 2023-03-05 DIAGNOSIS — E119 Type 2 diabetes mellitus without complications: Secondary | ICD-10-CM | POA: Insufficient documentation

## 2023-03-05 DIAGNOSIS — M2040 Other hammer toe(s) (acquired), unspecified foot: Secondary | ICD-10-CM | POA: Insufficient documentation

## 2023-03-05 NOTE — Progress Notes (Signed)
PODIATRY CLINIC  DEPARTMENT OF ORTHOPAEDICS  [facility]    Physician Office Center  [address]  [place], [place] [address]      PATIENT NAME:  Courtney [name]  DATE OF BIRTH:  Nov 24, 1965  MRN:  [id number]  DATE OF SERVICE:  03/05/2023     HISTORY OF PRESENT ILLNESS:   57 y.o. Non-Hispanic female presents for L foot surgical discussion. Last visit 03/13/2022. She states she is interested in scheduling for a December surgery date to have her L foot hammertoes fixed. She relates her L 2nd digit overlaps her 3rd digit and causes her pain. She states her remaining hammertoes do not bother her. Hx of s/p right foot exostectomy of prominent sesamoid bones, hallux IPJ fusion with screw fixation, arthroplasty with temporary K-wire fixation digits 2, 3, 4 and 5th digit derotational arthroplasty; DOS 12/06/2021. Past surgical hx includes lesser digit hammertoe repair to toes 2 -4 in the 1990's. 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  She is a former smoker.  She presents to clinic today with her spouse.     REVIEW OF SYSTEMS:   Denies fevers, chills, nausea, vomiting, and shortness of breath.  All other systems reviewed and are negative, except as reported in HPI.      PHYSICAL EXAM:   General:  Appears stated age.  Well-appearing.    Psychiatric:  Pleasant.  Cooperative.  Alert and oriented x 3.    Respiratory:  Lung expansion symmetric.  No active work of breathing.    Heme/Lymphatic:  No lymphadenopathy.    ENMT:  Sclera clear.  Trachea midline.  Mucous membranes moist.    Vascular:  +2/4 dorsalis pedis and posterior tibial pulses, bilaterally.  Capillary fill time was less than 3 seconds, bilaterally.  Skin temperature was warm at the level of the toes, bilaterally.  There was no edema noted.   Intact hair growth noted to bilateral tibia.  Skin was of good turgor and texture.    Neurologic:  Gross sensation intact to  touch, bilaterally.  No paresthesias noted, bilateral feet. No numbness noted.  Dermatologic: Right foot multiple surgical incisions well healed.  R hallux nail noted to be absent.  Musculoskeletal:  Left foot hammertoe deformities to left 3 lesser toes; partially reducible. Left 2nd digit noted overlapping 3rd digit and in abducted position.  +5/5 dorsiflexion, plantarflexion, inversion, eversion, intrinsic muscle strength, bilaterally.  Full range of motion, all quadrants, bilaterally.  No pain with range of motion.  No crepitus noted.         RADIOGRAPHS:  None taken today.      ASSESSMENT:      1. Right foot exostectomy of prominent sesamoid bones, hallux IPJ fusion with screw fixation, arthroplasty with temporary K-wire fixation digits 2, 3, 4 and 5th digit derotational arthroplasty; DOS 12/06/2021.  Surgical site appears stable, healing well and with no acute signs of infection  2.  Hx of Right  2nd -4th digit hammertoe surgery with recurrence.  3. Left foot 2nd digit hammertoe abducted and overlapping 3rd digit with pain; failed conservative treatment.   4. DM 2 (HgbA1c 5.7 12/29/2018)      PLAN:    - Discussed clinical exam findings and answered all questions.  - Discussed continued conservative vs surgical treatment for above including change in footwear, change in activity, toe splints, and L foot 2 digit arthroplasty with temporary K-wire.   -  Continue toe splints on the L foot prn  - Discussed continued use of supportive footwear.  - Given pt has failed lengthy conservative treatment, surgical case submitted for L foot 2nd digit arthroplasty with temporary K-wire fixation.  - RTC pre-op H&P.      This note may have been partially generated using MModal Fluency Direct system and there may be some incorrect words, spellings, and punctuation that were not noted in checking the note before saving.    I am scribing for, and in the presence of, Victorino Sparrow, DPM, for services provided on 03/05/2023.  Rosaland Lao, SCRIBE    I personally performed the services described in this documentation, as scribed  in my presence, and it is both accurate  and complete.    Victorino Sparrow, DPM       On the day of the encounter, a total of  20 minutes was spent on this patient encounter including review of historical information, examination, documentation and post-visit activities. The time documented excludes procedural time.         Samara Deist Herschel [place]  Podiatry, Assistant Professor  Department of Orthopaedics  [facility] Div. of Medicine  Pager [phone]

## 2023-04-13 ENCOUNTER — Other Ambulatory Visit (HOSPITAL_COMMUNITY): Payer: Self-pay

## 2023-04-13 IMAGING — MR MRI THORACIC SPINE WITHOUT CONTRAST
4 of 5 series · 26 of 48 positions shown · non-contrast
Comparison: None available.

﻿EXAM:  [id number]   MRI THORACIC SPINE WITHOUT CONTRAST
INDICATION: Disc degeneration.
TECHNIQUE: Noncontrast multiplanar, multisequence MRI was performed.

[Series 5: T2 · sagittal · 4.0mm · 0.78mm/px · 8 of 13 slices shown (1 of 2)]
[im 1/13]
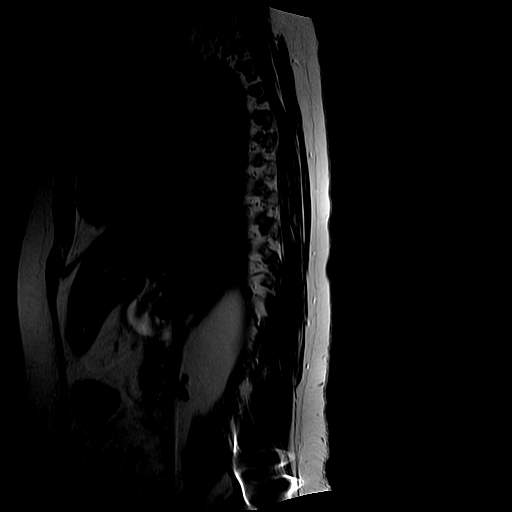
[im 2/13]
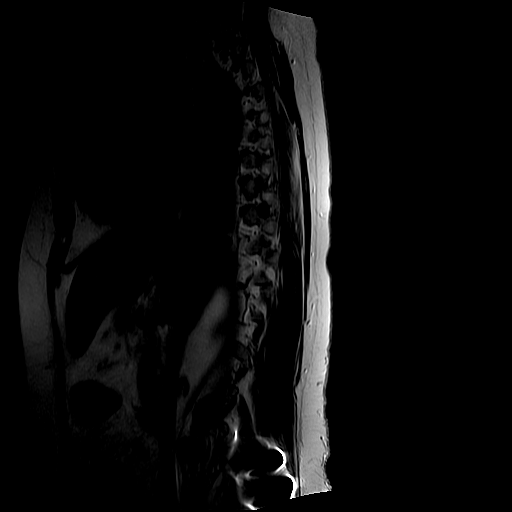
[im 5/13]
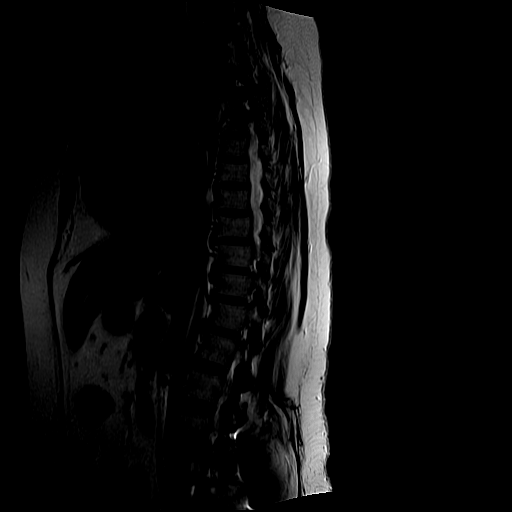
[im 6/13]
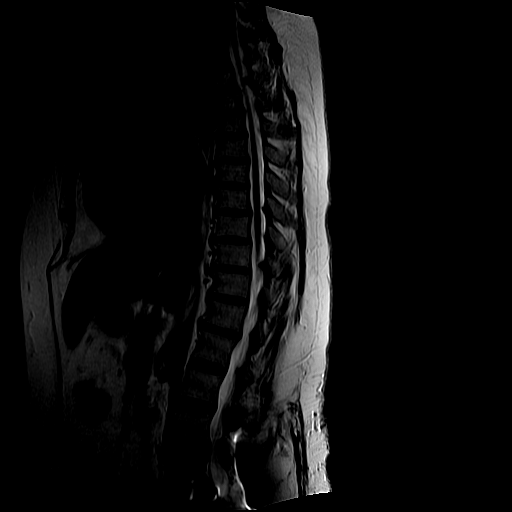
[im 7/13]
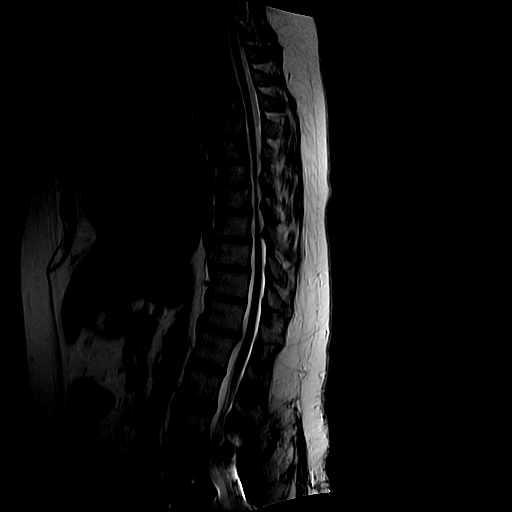
[im 9/13]
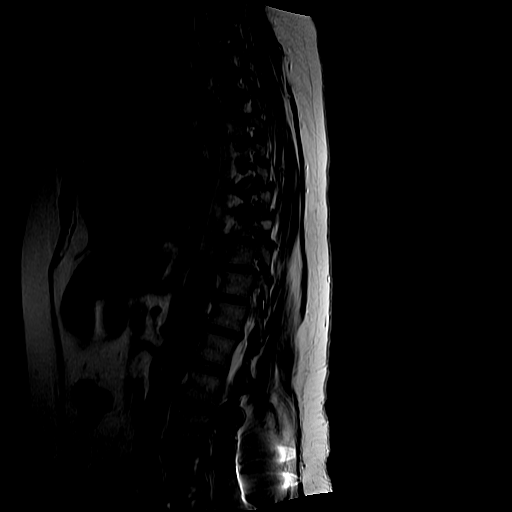
[im 11/13]
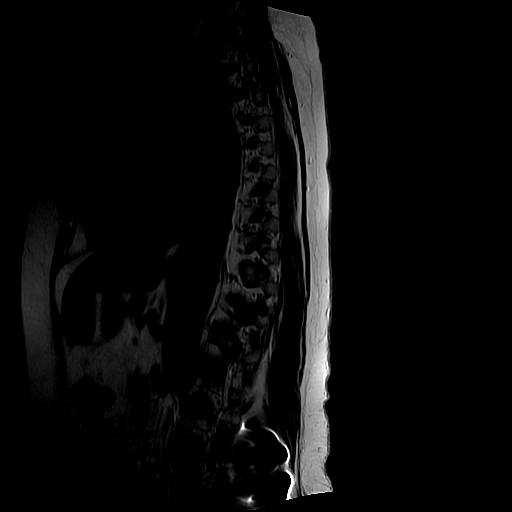
[im 13/13]
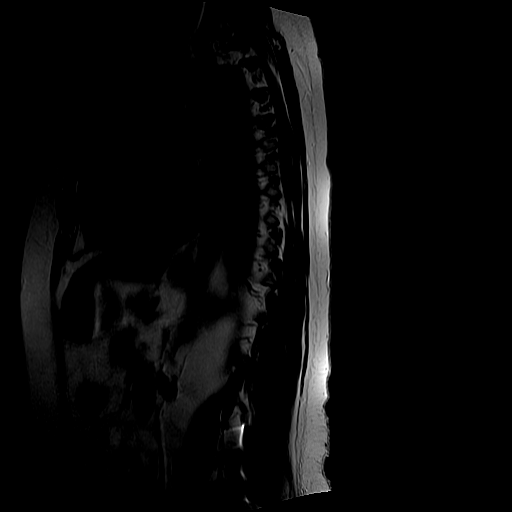

[Series 6: T1 · sagittal · 4.0mm · 0.78mm/px · 6 of 13 slices shown (1 of 2)]
[im 1/13]
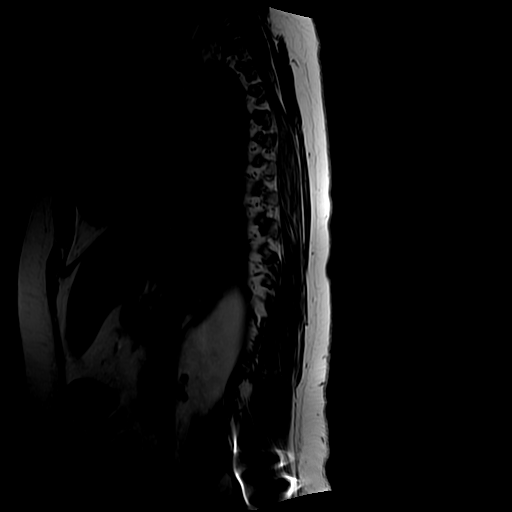
[im 2/13]
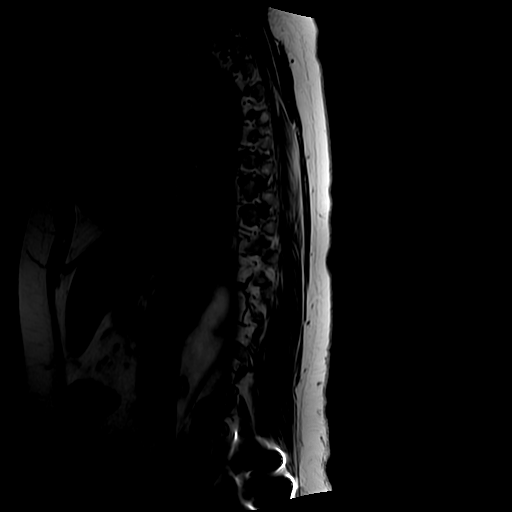
[im 5/13]
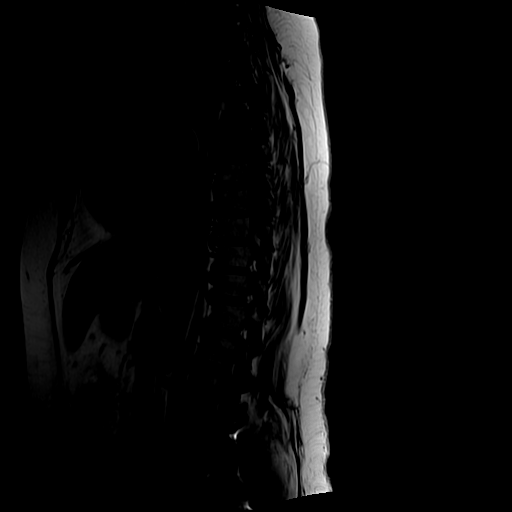
[im 6/13]
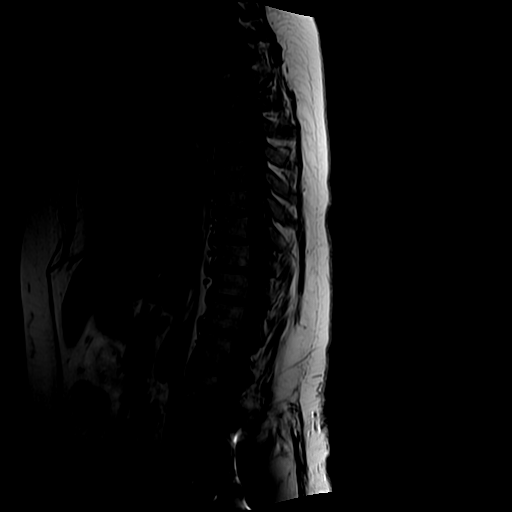
[im 7/13]
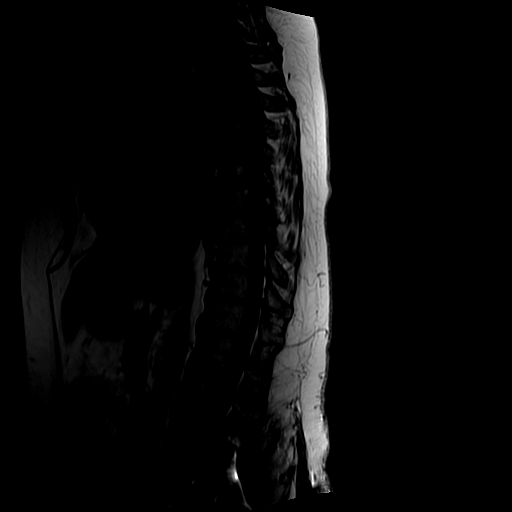
[im 11/13]
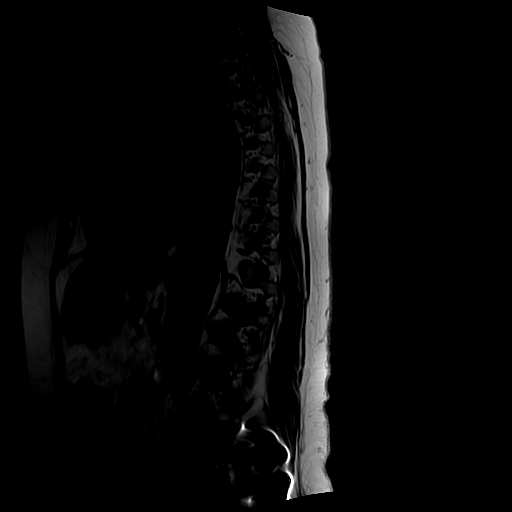

[Series 8: T2 · axial · 4.0mm · 0.62mm/px · z∈[-218,-24]mm · 9 of 12 slices shown (2 of 2)]
[im 1/12]
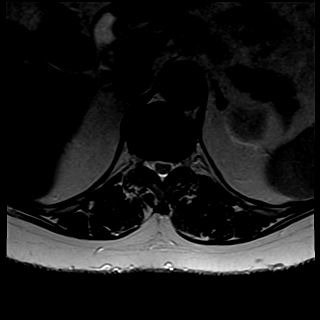
[im 2/12]
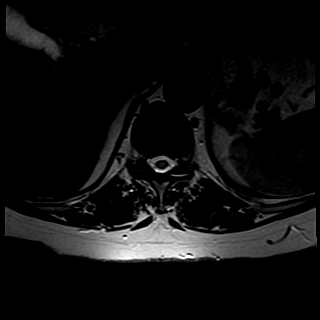
[im 3/12]
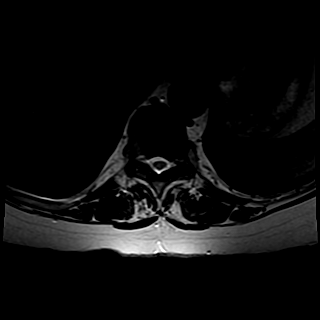
[im 5/12]
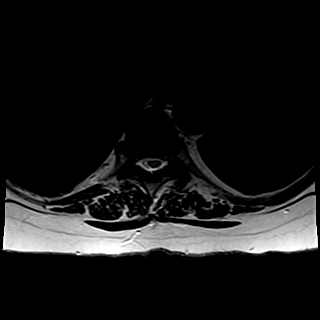
[im 6/12]
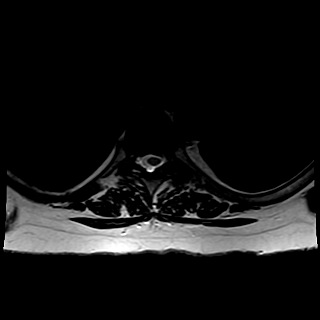
[im 7/12]
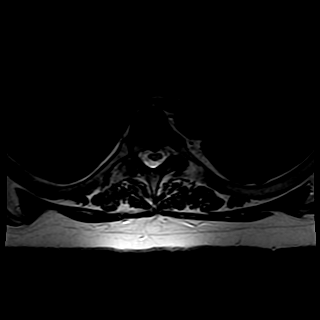
[im 9/12]
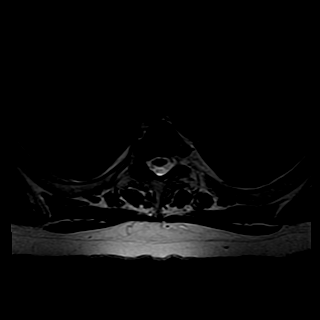
[im 10/12]
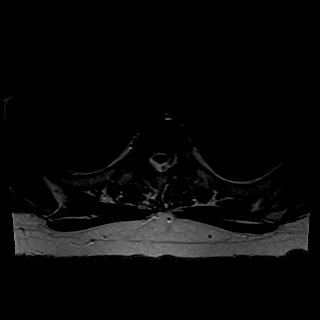
[im 12/12]
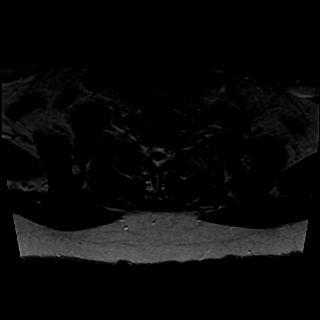

[Series 9: T1 · axial · 4.0mm · 0.62mm/px · z∈[-197,-54]mm · 3 of 12 slices shown (2 of 2)]
[im 2/12]
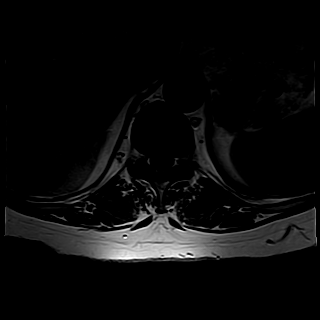
[im 6/12]
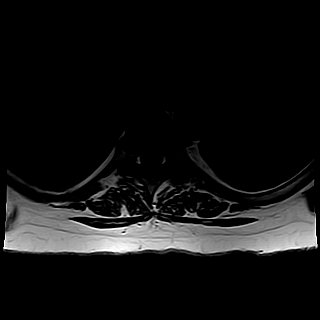
[im 10/12]
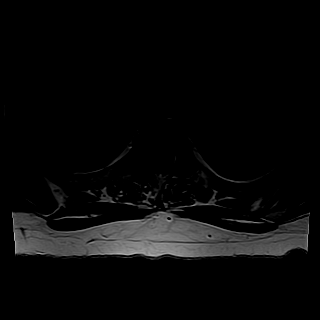

[26 of 48 positions shown; findings below may reference images not displayed]

FINDINGS: There are mild degenerative changes at multiple levels with slight disc space narrowing disc desiccation, and small osteophyte formation.  

There is mild disc bulging at multiple levels with slight compression of the thecal sac but no apparent spinal cord compression.  No definite disc herniation is seen.  There is no significant spinal stenosis or intrinsic abnormality of the spinal cord.  

There is no fracture, malalignment, or significant marrow signal alteration.
IMPRESSION: 1. Mild degenerative changes with mild multilevel disc bulging. 

2. No fracture, spinal stenosis, or disc herniation is seen.

## 2023-04-13 IMAGING — MR MRI CERVICAL SPINE WITHOUT CONTRAST
4 of 5 series · 26 of 48 positions shown · non-contrast
Comparison: August 22, 2021

﻿EXAM:  [id number]   MRI CERVICAL SPINE WITHOUT CONTRAST
INDICATION: Disc degeneration.
TECHNIQUE: Noncontrast multiplanar, multisequence MRI was performed.

[Series 5: T2 · sagittal · 3.5mm · 0.78mm/px · 6 of 11 slices shown (1 of 2)]
[im 1/11]
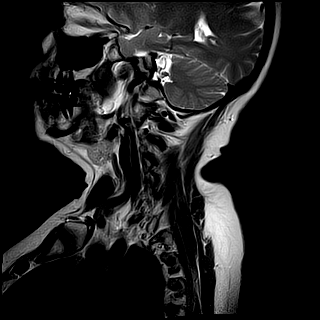
[im 3/11]
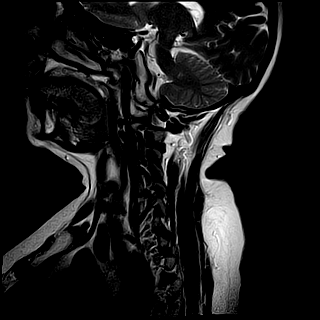
[im 5/11]
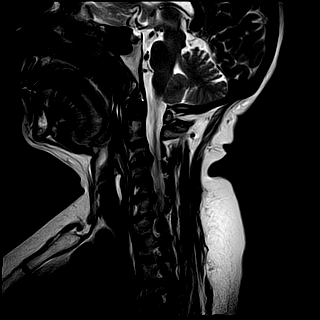
[im 7/11]
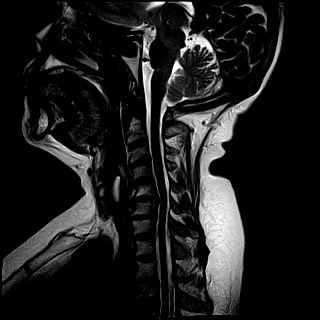
[im 9/11]
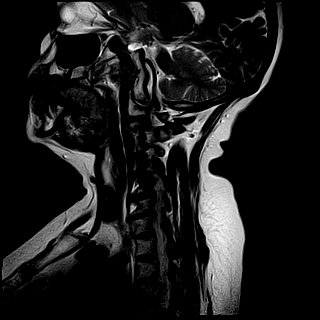
[im 11/11]
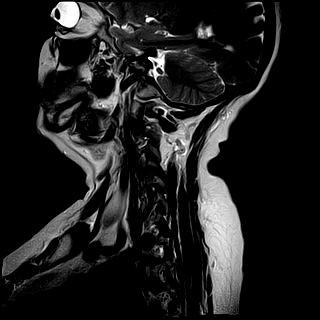

[Series 6: T1 · sagittal · 3.0mm · 0.49mm/px · 6 of 11 slices shown]
[im 1/11]
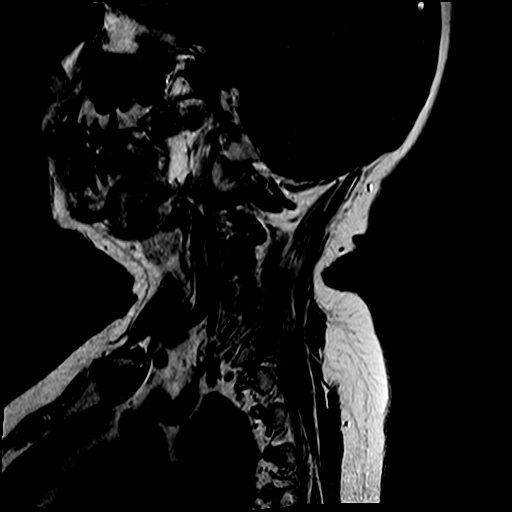
[im 2/11]
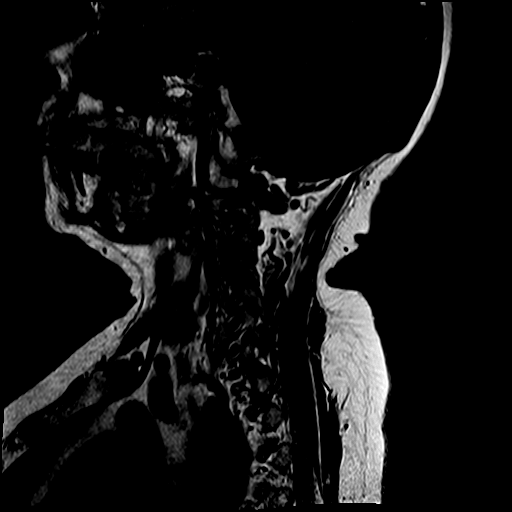
[im 4/11]
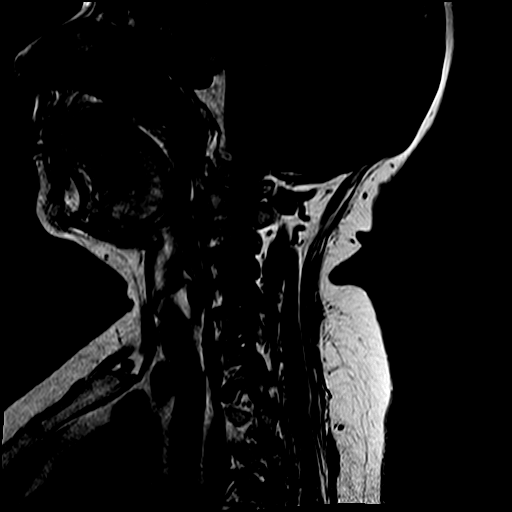
[im 6/11]
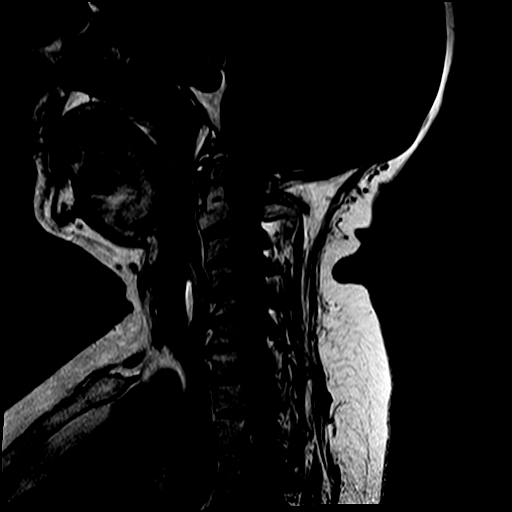
[im 7/11]
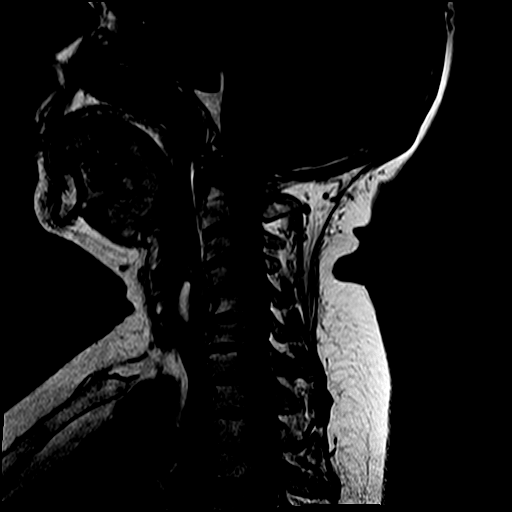
[im 9/11]
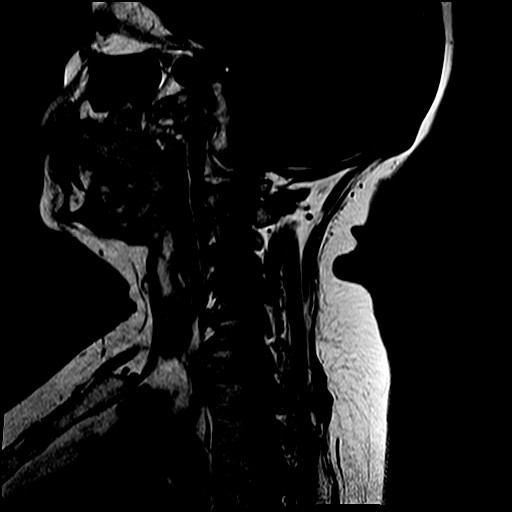

[Series 7: STIR · sagittal · 3.0mm · 0.49mm/px · 3 of 11 slices shown]
[im 2/11]
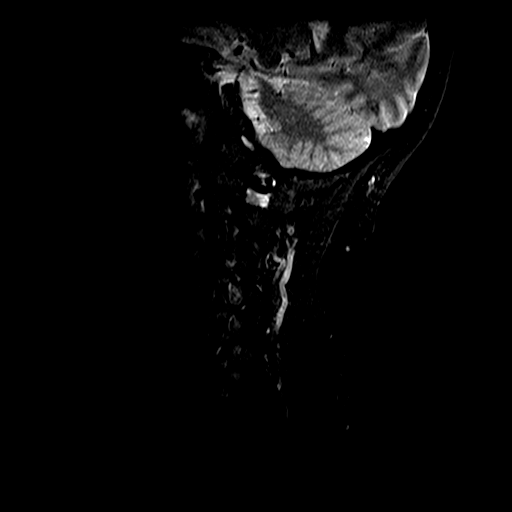
[im 6/11]
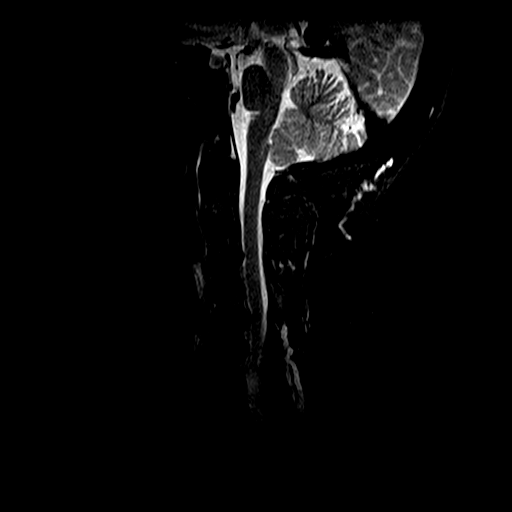
[im 9/11]
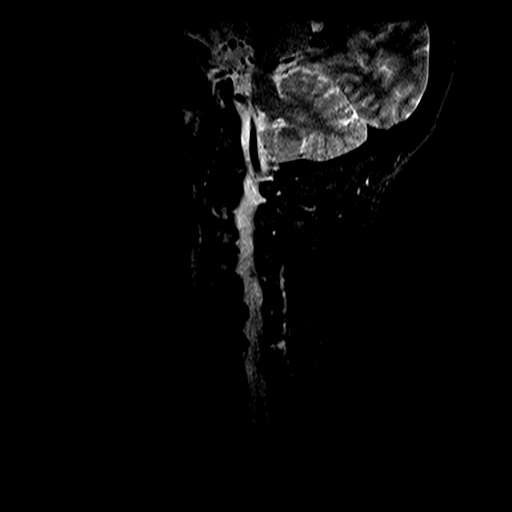

[Series 8: T2 · axial · 4.5mm · 0.40mm/px · z∈[-117,-12]mm · 11 of 28 slices shown (2 of 2)]
[im 2/28]
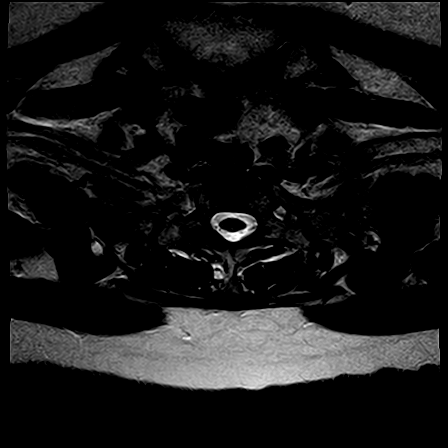
[im 4/28]
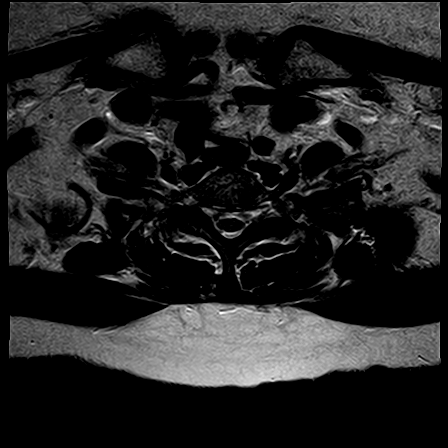
[im 6/28]
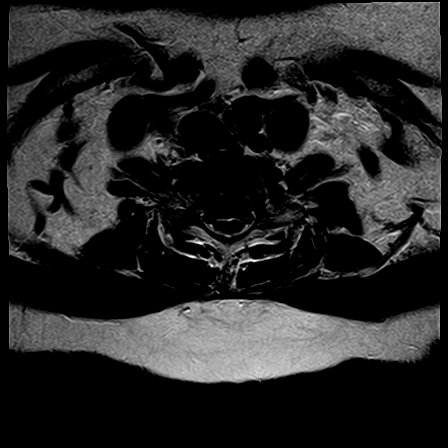
[im 9/28]
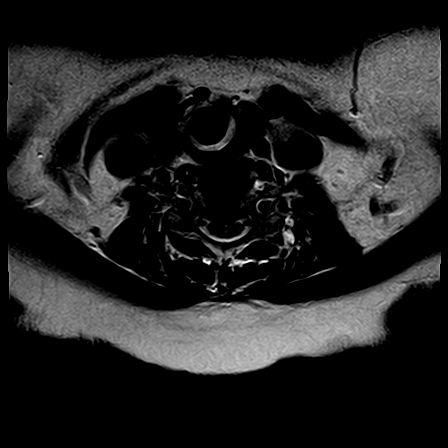
[im 12/28]
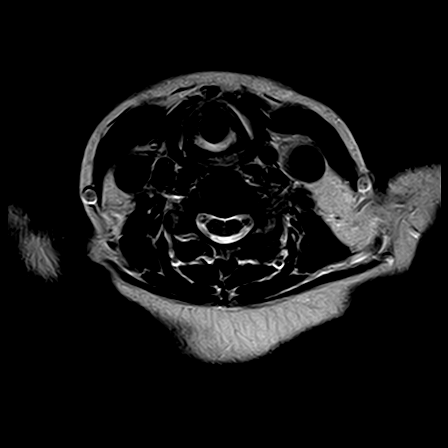
[im 14/28]
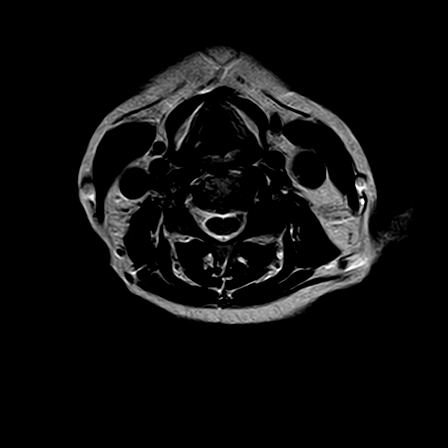
[im 16/28]
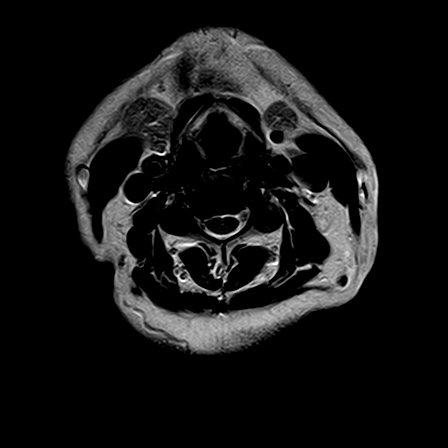
[im 19/28]
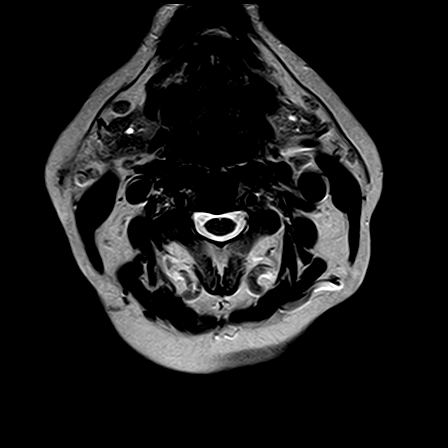
[im 22/28]
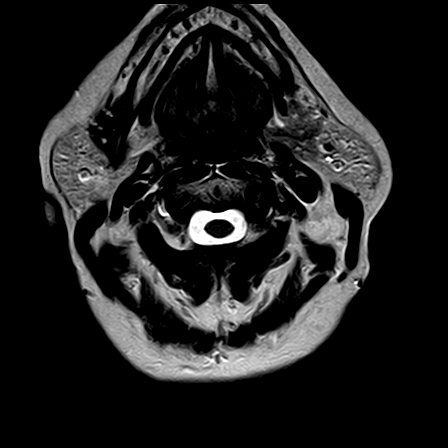
[im 24/28]
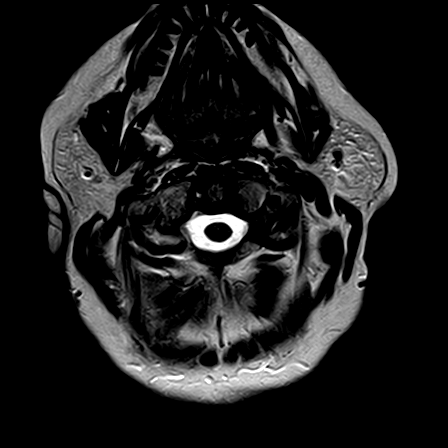
[im 26/28]
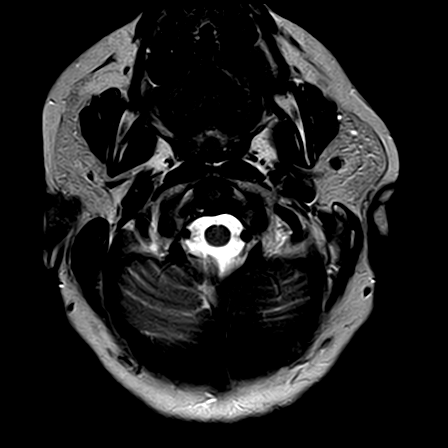

[26 of 48 positions shown; findings below may reference images not displayed]

FINDINGS: Compared to the prior exam dated August 22, 2021, there has been no significant change.  There are small disc-osteophyte complexes at C3-C4 and C6-C7 with slight compression of the thecal sac.  There is a moderate sized disc-osteophyte complex at C5-C6 with slight displacement of the spinal cord but no spinal cord compression or myelopathy.

There are moderate degenerative changes at C5-C6 and C6-C7 with disc space narrowing, osteophyte formation, and mild degenerative marrow signal alteration.  

There is no fracture, malalignment, intrinsic spinal cord abnormality, or Chiari malformation.  

There is mild to moderate narrowing of multiple bilateral neural foramina.
IMPRESSION: Multiple disc-osteophyte complexes as described above without significant change.

## 2023-04-23 ENCOUNTER — Encounter (HOSPITAL_COMMUNITY): Payer: Self-pay

## 2023-04-23 DIAGNOSIS — K7581 Nonalcoholic steatohepatitis (NASH): Secondary | ICD-10-CM | POA: Insufficient documentation

## 2023-04-23 DIAGNOSIS — E611 Iron deficiency: Secondary | ICD-10-CM | POA: Insufficient documentation

## 2023-04-23 DIAGNOSIS — E66811 Obesity, class 1: Secondary | ICD-10-CM | POA: Insufficient documentation

## 2023-04-23 DIAGNOSIS — M1611 Unilateral primary osteoarthritis, right hip: Secondary | ICD-10-CM | POA: Insufficient documentation

## 2023-04-23 DIAGNOSIS — I341 Nonrheumatic mitral (valve) prolapse: Secondary | ICD-10-CM | POA: Insufficient documentation

## 2023-04-23 DIAGNOSIS — M2040 Other hammer toe(s) (acquired), unspecified foot: Secondary | ICD-10-CM | POA: Insufficient documentation

## 2023-04-23 DIAGNOSIS — E539 Vitamin B deficiency, unspecified: Secondary | ICD-10-CM | POA: Insufficient documentation

## 2023-04-23 DIAGNOSIS — M5030 Other cervical disc degeneration, unspecified cervical region: Secondary | ICD-10-CM | POA: Insufficient documentation

## 2023-04-23 DIAGNOSIS — M159 Polyosteoarthritis, unspecified: Secondary | ICD-10-CM | POA: Insufficient documentation

## 2023-04-24 ENCOUNTER — Encounter (HOSPITAL_COMMUNITY): Payer: Self-pay

## 2023-04-27 DIAGNOSIS — E785 Hyperlipidemia, unspecified: Secondary | ICD-10-CM | POA: Insufficient documentation

## 2023-04-28 ENCOUNTER — Encounter (HOSPITAL_COMMUNITY): Payer: Self-pay

## 2023-04-28 DIAGNOSIS — R252 Cramp and spasm: Secondary | ICD-10-CM | POA: Insufficient documentation

## 2023-04-28 DIAGNOSIS — R002 Palpitations: Secondary | ICD-10-CM | POA: Insufficient documentation

## 2023-05-22 ENCOUNTER — Ambulatory Visit (INDEPENDENT_AMBULATORY_CARE_PROVIDER_SITE_OTHER): Payer: Self-pay | Admitting: Podiatrist

## 2023-06-26 ENCOUNTER — Ambulatory Visit (INDEPENDENT_AMBULATORY_CARE_PROVIDER_SITE_OTHER): Payer: Self-pay | Admitting: Podiatrist

## 2023-07-03 ENCOUNTER — Ambulatory Visit (INDEPENDENT_AMBULATORY_CARE_PROVIDER_SITE_OTHER): Payer: Self-pay | Admitting: Podiatrist

## 2023-07-24 ENCOUNTER — Ambulatory Visit (INDEPENDENT_AMBULATORY_CARE_PROVIDER_SITE_OTHER): Payer: Self-pay | Admitting: Podiatrist

## 2023-08-28 ENCOUNTER — Encounter (HOSPITAL_COMMUNITY): Payer: Self-pay

## 2023-08-31 ENCOUNTER — Encounter (HOSPITAL_COMMUNITY): Payer: Self-pay

## 2023-09-01 ENCOUNTER — Encounter (HOSPITAL_COMMUNITY): Payer: Self-pay

## 2023-09-02 ENCOUNTER — Encounter (HOSPITAL_COMMUNITY): Payer: Self-pay

## 2023-09-02 ENCOUNTER — Inpatient Hospital Stay (HOSPITAL_COMMUNITY)
Admission: RE | Admit: 2023-09-02 | Discharge: 2023-09-02 | Disposition: A | Discharge status: Home / Self Care | Source: Ambulatory Visit

## 2023-09-18 ENCOUNTER — Encounter (INDEPENDENT_AMBULATORY_CARE_PROVIDER_SITE_OTHER): Payer: Self-pay

## 2023-10-14 DIAGNOSIS — Z01818 Encounter for other preprocedural examination: Secondary | ICD-10-CM | POA: Insufficient documentation

## 2023-10-14 DIAGNOSIS — G4733 Obstructive sleep apnea (adult) (pediatric): Secondary | ICD-10-CM | POA: Insufficient documentation

## 2023-10-14 NOTE — H&P (Signed)
 Addendum:  none      Perioperative Evaluation Center  Department of Anesthesiology  Optimization Visit  History and Physical     Courtney, Pendry Naoko [name], 58 y.o. female Medical Record Number: [id number]   Date of Birth:  Apr 02, 1966 Date of Service:  10/16/2023      Information Obtained from: patient Surgeon:  Surgeon(s):  Alen Amy, DPM    Date of Surgery: 10/23/2023  Planned anesthesia: Monitor Anesthesia Care   Estimated Case Length: 105 Min     CHIEF COMPLAINT  Pre-Operative Optimization Visit for ARTHROPLASTY HAMMERTOE CORRECTION: 28285 (CPT) due to left foot 2nd digit overlapping hammertoe.        RECOMMENDATION  SUMMARY: The surgery is considered elective.  The surgery is considered low intensity.  Recommend  proceeding towards surgery.    Patient has the following outstanding needs for optimization:   None  Patient would benefit from active incentive spirometer, early ambulation following surgery and multi-modal pain management approach as appropriate.  DVT prophylaxis as appropriate.  Please see addendum for any updates after 10/16/2023.        ASSESSMENT & PLAN    Problem List Items Addressed This Visit          Cardiovascular System    Hypertension  Assessment: BP: BP (Non-Invasive): 126/79 at today's appt. Follows with  PCP. Prescribed Losartan .     Echo 05/15/23:   Normal LV size.  The ejection fraction is greater than 65%.  Normal diastolic function.  Moderate LV hypertrophy.  Normal right ventricular size.  Normal interatrial septum.  Normal, trileaflet aortic valve.  There is mild aortic regurgitation.  Normal mitral valve.  Mild tricuspid regurgitation.  Normal pulmonary artery systolic pressure.    Plan: Continue monitoring/management as directed by PCP for continued follow-up.         Plan: continue to follow with cardiology       Respiratory    OSA on CPAP  -diagnosed 2019. Compliant with CPAP.                   Other      Hypothyroidism  -prescribed synthroid , follows with PCP.   -Plan: continue  management per PCP      Type 2 Diabetes  Assessment: Diagnosed 2023. Prescribed Mounjaro. Managed by PCP. FS 93 Non-fasting at today's appointment.  Per note from Marsa Skates on 07/23/2023, "Her A1C was 5 recently and she has lost 30 pounds since being placed on Mounjaro."  Plan: Discussed importance of glycemic control prior to surgical procedure. Holding recommendations discussed regarding diabetic medications.  Continue monitoring/management as directed by PCP.   HEMOGLOBIN A1C   Date Value Ref Range Status   12/29/2018 5.7 (H) 4.0 - 5.6 % Final        Preoperative examination  Pre-Operative Optimization Visit for ARTHROPLASTY HAMMERTOE CORRECTION: 28285 (CPT) due to left foot 2nd digit overlapping hammertoe.       MEDICATION INSTRUCTIONS    Take the following medications the morning of surgery: Wellbutrin , zyrtec, levothyroxine , Protonix ,     Hold the following medications the day of surgery: losartan     Hold aspirin  7 days before surgery    Hold Mounjaro at least 7 days prior to surgery.     Hold NSAIDs (Motrin, Advil, Aleve, Ibuprofen, Naprosyn), vitamins, fish oil, and herbal supplements 7 days prior to the procedure.      DIET INSTRUCTIONS   Full liquid diet starting the morning of the day before procedure/surgery, continue  full liquid diet until 8 hours prior to procedure/surgery arrival time, consume clear liquids up until 3 hours before procedure/surgery     HPI  Courtney [name] is a 58 y.o. female that presents with need for pre-operative optimization.  Pre-Operative Optimization Visit for ARTHROPLASTY HAMMERTOE CORRECTION: 28285 (CPT) due to left foot 2nd digit overlapping hammertoe.      Operative Reference Anesthesia  ASA  2    METS  >4    History of difficult intubation No    Personal or family history of anesthesia problems Yes PONV    OSA  Yes -Compliant with CPAP      Operative Reference Surgery  Allergy to the following: latex, metal/nickel, iodine /shellfish, acrylic,  egg/propofol , all opioids No    Recent Hospitalization (within the last 3 months) No    Current Tobacco Use  No    Current Alcohol  Use No    Current Drug Use  No      Assessment Tools   Hx of Lung Disease   No   Concern for Cardiac Ischemia No   History of PE/DVT and/or Clotting Disorder No   History of Liver Disease No   Frailty Score No    Reference:     []  F= Fatigue in the past month? (More unusual fatigue than normal or inability to complete ADLs)  []  R= Resistance- difficulty climbing a flight of stairs?   []  A= Ambulation- difficulty walking one block on level land?  [x]   I= Illness (HTN, DM, Cancer (other than minor skin CA), Chronic Lung Disease, Heart Attack, CHF, Angina, Asthma, Arthritis, Stroke, CKD); 5+=1 point; <5=0  []   L= Loss of weight- have you lost >5% of your body weight in the past year?    0= Robust health; Pre-Frail= 1-2; Frail= 3-4; Severely Frail= 5    AD8 <2     STOP-BANG:       06/08/2017     2:00 PM 12/29/2018     4:00 PM 08/29/2021    10:00 AM 11/29/2021     1:00 PM 10/16/2023    12:00 PM   Stop Bang   Have you been Diagnosed with SLEEP APNEA (if yes do not need to continue Stop Bang)  Yes Yes Yes Yes   SNORING - do you snore loudly? (louder than talking or loud enough to be heard through closed doors) 1       TIRED - do you often feel tired, fatigued, or sleepy during daytime? 1       OBSERVED - has anyone observed you stop breathing during your sleep? 0       BLOOD PRESSURE - Do you have or are you being treated for high blood pressure? 1       BMI - greater than 35 kg/m2 ? 0 kg/m2       AGE - over 7 yr old? 1       NECK CIRCUMFERENCE - greater than 40 cm? 0       GENDER - Female? 0       STOP score 3       BANG score 1       STOP BANG score 4       Stop Bang Risk Intermediate Risk             Past Medical History:  Past Medical History:   Diagnosis Date    Anemia 12/29/2018    iron Deficiency     Aneurysm (CMS HCC)  spleen    Anxiety     Arthritis     Asthma 12/29/2018    childhood, no  inhalers since    Back problem     Constipation     CPAP (continuous positive airway pressure) dependence     Disorder of liver     Fatty Liver    Eczema 12/29/2018    hands x 1 year- eczema- seen derm    Esophageal reflux 12/29/2018    controlled    Fibromyalgia     Headache     Heart murmur 12/29/2018    Reports she was told this in her 38s    Hx of transfusion 12/29/2018    denies rx    Hyperlipidemia     Hypertension     Hypothyroid     Migraine     Nausea with vomiting     Neck problem     Peripheral edema 12/29/2018    chronic left lower extremity swelling     Peripheral neuropathy     Peripheral vascular disease (CMS HCC)     left lower extremity     PONV (postoperative nausea and vomiting)     Pre-diabetes     Problems with swallowing     Shortness of breath 12/29/2018    DOE- several years    Sleep apnea 12/29/2018    doesn't wear CPAP     Thyroid  disease     Type 2 diabetes mellitus     Diagnosed 06/2021, Hgba1c- ?    Wears glasses      Past Surgical History:   Procedure Laterality Date    ANKLE SURGERY Right     BLADDER SURGERY      CESAREAN SECTION      x 3    COLONOSCOPY      HX BACK SURGERY      lumbar fusion x2, rods, screws    HX CARPAL TUNNEL RELEASE Bilateral 2020    HX FOOT SURGERY      HX HIP REPLACEMENT Bilateral     Right infected and revised    HX HYSTERECTOMY      HX UPPER ENDOSCOPY      LAMINECTOMY      fusion, x2    LEG SURGERY Left     x 3 , skin graft - from dog bite     Family Medical History:    None             Review of Systems:  Review of Systems   Constitutional: Positive for fatigue.   Skin:  Positive for rash.   HENT: Positive for difficulty swallowing, congestion and visual problem.   Exercise Tolerance: Negative for less than 4 METS.   Cardiovascular:  Positive for edema.   Respiratory:  Positive for cough and shortness of breath (with exertion).    Gastrointestinal:  Positive for constipation.   Genitourinary:  Positive for dysuria, urgency, frequency, bladder incontinence and  nocturia.   Musculoskeletal:  Positive for neck pain, back pain, joint pain and musculoskeletal pain.   Family Anesthesia History: negative anesthesia history ROS.  Anesthesia: Negative.    Neurological:  Positive for headaches, numbness, muscle weakness and memory loss.   Psychiatric/Behavioral:  Positive for physiological symptoms of anxiety.        Exam:  BP 126/79 (Site: Right Arm, Patient Position: Sitting, Cuff Size: Adult)   Pulse 67   Temp 36.6 C (97.9 F) (Thermal Scan)   Resp 16   Ht 1.64  m (5' 4.57")   Wt 79 kg (174 lb 2.6 oz)   SpO2 98%   BMI 29.37 kg/m       Body mass index is 29.37 kg/m.         Airway       Mallampati: II    TM distance: >3 FB    Neck ROM: full      No Beard  No endotracheal tube present  No Tracheostomy present    Dental           (+) missing           Pulmonary           Cardiovascular             Other findings                 Diagnostics:     Outside records with Echo reviewed. Recent labs reviewed. No further workup prior to surgery.     Diagnostics included will be reviewed  and followed up as determined necessary prior to procedure         Noel Bathe, MD  10/14/2023, 11:51    Perioperative Evaluation Center  [place]  [facility]  [place]    It should be emphasized that the [facility] Provider consultation is to assess the patient's medical conditions and provide recommendations to optimize the patient to decrease peri-operative complications.  Decision on whether or not to proceed to surgery is made at the care team's discretion.       I saw and examined the patient.  I reviewed the resident's note.  I agree with the findings and plan of care as documented in the resident's note.  Any exceptions/additions are edited/noted.    Janell Media, MD

## 2023-10-16 ENCOUNTER — Encounter (HOSPITAL_COMMUNITY): Payer: Self-pay

## 2023-10-16 ENCOUNTER — Encounter (INDEPENDENT_AMBULATORY_CARE_PROVIDER_SITE_OTHER): Payer: Self-pay | Admitting: Podiatrist

## 2023-10-16 ENCOUNTER — Ambulatory Visit: Payer: Self-pay | Attending: Podiatrist | Admitting: Podiatrist

## 2023-10-16 ENCOUNTER — Other Ambulatory Visit: Payer: Self-pay

## 2023-10-16 ENCOUNTER — Ambulatory Visit (HOSPITAL_COMMUNITY)
Admission: RE | Admit: 2023-10-16 | Discharge: 2023-10-16 | Disposition: A | Discharge status: Home / Self Care | Payer: Self-pay | Source: Ambulatory Visit | Attending: Podiatrist | Admitting: Podiatrist

## 2023-10-16 ENCOUNTER — Ambulatory Visit (HOSPITAL_BASED_OUTPATIENT_CLINIC_OR_DEPARTMENT_OTHER): Payer: Self-pay

## 2023-10-16 VITALS — BP 126/79 | HR 67 | Temp 97.9°F | Resp 16 | Ht 64.57 in | Wt 174.2 lb

## 2023-10-16 VITALS — BP 134/70 | HR 74 | Temp 97.9°F | Ht 64.0 in | Wt 172.0 lb

## 2023-10-16 DIAGNOSIS — M2040 Other hammer toe(s) (acquired), unspecified foot: Secondary | ICD-10-CM | POA: Insufficient documentation

## 2023-10-16 DIAGNOSIS — I10 Essential (primary) hypertension: Secondary | ICD-10-CM

## 2023-10-16 DIAGNOSIS — M5416 Radiculopathy, lumbar region: Secondary | ICD-10-CM

## 2023-10-16 DIAGNOSIS — G8929 Other chronic pain: Secondary | ICD-10-CM | POA: Insufficient documentation

## 2023-10-16 DIAGNOSIS — M205X2 Other deformities of toe(s) (acquired), left foot: Secondary | ICD-10-CM | POA: Insufficient documentation

## 2023-10-16 DIAGNOSIS — Z01818 Encounter for other preprocedural examination: Secondary | ICD-10-CM

## 2023-10-16 DIAGNOSIS — G4733 Obstructive sleep apnea (adult) (pediatric): Secondary | ICD-10-CM

## 2023-10-16 DIAGNOSIS — M79675 Pain in left toe(s): Secondary | ICD-10-CM | POA: Insufficient documentation

## 2023-10-16 DIAGNOSIS — E119 Type 2 diabetes mellitus without complications: Secondary | ICD-10-CM | POA: Insufficient documentation

## 2023-10-16 LAB — POC BLOOD GLUCOSE (RESULTS): GLUCOSE, POC: 93 mg/dL (ref 65–125)

## 2023-10-16 NOTE — Nurses Notes (Signed)
 [facility]    Preoperative Evaluation Center   Department of Anesthesiology      Name: Courtney [name], Courtney [name]   DOB: 03-23-1966    PRE-PROCEDURE/OPERATIVE INSTRUCTIONS   Preoperative [facility]  [address], [place], [place] [address]    Thank you for choosing [facility] for your health care needs. [facility] is a tobacco-free campus. Please refrain from using any forms of tobacco while on the premises.     Please review upon receipt and again prior to procedure date.    If, after your PEC call or visit, you experience any of the changes below or develop any of the listed symptoms, Call the Preoperative Evaluation Center [facility]) at [phone].   Change in your best contact phone number.  Changes in your medications, especially starting a new medication.  Changes in your medical history, including an Emergency room visit, a hospital admission, or you receive a new diagnosis.    Develop any of these symptoms:  fever, cough, sore throat, shortness of breath, chills, muscle pain, new loss of taste or smell, vomiting or diarrhea, or fatigue (extreme tiredness or lack of energy).  Diagnosed with conjunctivitis (pink eye) or shingles.  Diagnosed as COVID positive.  Develop a wound, rash, open area or sores.  If you use or are prescribed an antibiotic.     Also contact your PCP to discuss your symptom(s) and the potential need for treatment/ appointments/etc.     PROCEDURE:    Procedure(s):  ARTHROPLASTY HAMMERTOE CORRECTION    DATE of SURGERY:   October 23, 2023    ARRIVAL LOCATION:   1st Floor Registration     Arrival and diet instruction times will be given the business day prior to your procedure between 2 pm - 5 pm. If you should not hear from anyone by 5 pm the business day prior to your procedure, please call [phone] (Option 4).    Patients and visitors may park for free in the lots in front of the hospital. If you are need assistance from [facility] parking lot, we have transportation available to  you.  Call security at [phone] to arrange pick up from the parking lot.    DIET INSTRUCTIONS:   FULL LIQUID diet the morning before surgery/procedure.  CLEAR LIQUID diet 8 hours before the arrival time of surgery/procedure.  STOP clear liquids 3 hours before scheduled surgery time then NPO (NPO means NO FOOD or DRINK).    ACCEPTABLE FULL LIQUIDS:  Beverages include milk, including lactose-free and plant based milks, tea or coffee with milk or cream, but no solid additives or pulp, juice strained to remove pulp.  Soups/Broths include strained cream soups; broth or bullion; pureed soups.  Protein sources include liquid supplements, Ensure/Boosts, etc; protein shakes, powdered protein mixed with liquids/  Dairy/dairy substitutes include yogurt without fruit pieces; pudding or custard; ice cream, sherbet, frozen yogurt- all without solid chunks, seeds, nuts.  Grains include cooked, blended cereals (cream of wheat, cream of rice, grits).  Other liquids include smooth sauces/gravies; gelatin/Jell-O; honey or syrup; butter, margarine, and oils.    ACCEPTABLE CLEAR LIQUIDS:  Water , fruit juices (white grape or apple), Pedialyte, Gatorade, contrast dye (limited to non-particulate forms, such as gastrografin), coffee & tea without fat/milk/creamer, and clear broth without fat or protein.    Your diet instructions are specific to your safety. When not followed, particles left in your stomach could enter your lungs during surgery. Failure to follow the instructions could result in a  delay or cancellation of your surgery.    No tobacco (cigarettes, vaping, snuff, or chewing tobacco) or medical marijuana (all types) 8 hours prior to scheduled arrival time.  No Alcohol , or recreational drugs, including marijuana, 24 hours prior to scheduled arrival time.  Chewing gum is permitted but do not swallow the gum as your surgery may be cancelled if swallowed.    MEDICATION INSTRUCTIONS:      Take the following medications the  morning of surgery: Wellbutrin , zyrtec, levothyroxine , Protonix ,      Hold the following medications the day of surgery: losartan      Hold aspirin  7 days before surgery     Hold Mounjaro at least 7 days prior to surgery.        Avoid taking:   All NSAIDS (Ibuprofen/Motrin/Advil, Aleve/Naprosyn/Anaprox, Diclofenac/Voltaren, Meloxicam/Mobic, Ketorolac /Toradol ). Vitamin E, Multivitamins, Herbals, Fish Oil, Supplements, & other over the counter meds for 7 days prior to your procedure, unless instructed otherwise. Tylenol  is safe to take up until and including the morning of surgery.    PREP:   Before surgery you can play an important role in your own health. Because skin is not sterile, we need to be sure that your skin is as free from germs as possible before surgery. You can help reduce the number of germs on your skin by carefully washing before surgery. To help make the process more effective, you will need to take 2 showers (one the evening before and one the morning of) using a special cleansing solution, Chlorhexidine Gluconate 4% (CHG). No shaving for 2 days prior to surgery to prevent any break in the skin.    Read and follow the steps carefully:     Wash from your chin down to your toes, front and back of your body. Exclude eyes, ears, mouth, genitalia & rectal regions.     On the evening before surgery   Wash and rinse your hair using normal shampoo, rinsing well to remove all possible shampoo residues.   Gently wash your body, from your neck down, avoiding genitalia & rectal regions, with the CHG-saturated cloth for 2 minutes.  Rinse well to remove solution from your body.  Pat yourself dry with a clean soft towel.   Dress in clean clothes following your shower.   Once you have showered, do not use regular soap, apply deodorant, lotions, moisturizers, makeup, body spray, perfume, cologne, or aftershave unless instructed differently by your Surgeon or their Clinic.    On the morning of surgery   Repeat the  antiseptic shower process listed above using the CHG solution using a new, clean washcloth. Dress in clean clothes.  Once you have showered, do not use regular soap, apply deodorant, lotions, moisturizers, makeup, body spray, perfume, cologne, or aftershave unless instructed differently by your Surgeon or their Clinic.    TRANSPORTATION:  You must arrange for a responsible person, 18 or older, to drive you home and stay for 24 hours following discharge after the procedure. Public transportation may only be used in the event you still have the responsible person with you, drivers of the transportation are not responsible for your care.  If you have not arranged for a driver and responsible person to accompany you, your procedure will be cancelled.      If you use home oxygen, bring enough for travel to hospital and return trip home.     OTHER IMPORTANT INFORMATION:  Do not bring money, jewelry, or valuables with you.    Do  not wear make-up, nail polish, lotion, powder, or aftershave. Do not wear any jewelry, watches, earrings, rings, or body piercings.    Piercings, including silicone, will be required to be removed in Pre-op area prior to surgery.  Remove your continuous glucose monitoring system prior to arrival as staff are required to check your glucose using facility monitors. Feel free to bring your device with you as you can reapply it after surgery, upon discharge.    Bring Programmer for implantable devices (Deep Brain Stimulator or Nerve Stimulator for pain/mental health/sleep apnea, etc.) as they may be required to be turned off.  Minors must be accompanied by a parent or legal guardian. The responsible adult must stay with the patient before and after the procedure.  No visitors under age 47 are permitted in the preparation/recovery areas. It is strongly suggested that child-care arrangements be made for other children at home.  If child stays overnight, one parent or other adult family member is  encouraged to stay during your child's hospitalization. Each room has a sleeper chair, couch, and shower, you are permitted to use. Siblings are not allowed to stay overnight. Rules are subject to change due to COVID guidelines.  Children may bring a special toy, doll, or blanket.  Wear glasses instead of contact lenses. If possible, bring a case for your glasses.  It may be possible to wear your hearing aid throughout the procedure. Discuss this with the Pre op nursing staff.  Wear comfortable shoes and clean clothes to the hospital as you will wear them home after the procedure.  Bring any equipment - crutches, splints, etc., that you are already using at home.  Bring any legal paperwork with you (guardianship, custody, Medical Power of Attorney, Designer, industrial/product, etc) if applicable.  Before using the bathroom at the hospital, check with hospital staff for needed specimens.  You must have a valid photo ID to fill prescriptions for narcotics.  If discharged with prescriptions, we offer an in-house delivery.  Narcotics require in-person pick up with valid ID.  Other medications can be delivered to your bedside.    LODGING:    Both the [employer] and [facility] provide "homes away from home" for families of patients. If you, the patient, plan to stay, a responsible person 72 or older, is required to stay with you for 24 hours following discharge, the same as if you are going home after discharge from facility. The [facility], located across the parking lot from the hospital provides lodging and supportive services to adult patients and their families. To be eligible, guests must live 50 miles or more from the [place] area and request a referral from a hospital staff person to be placed on the waiting list. [facility] staff and volunteers can assist you with hotel reservations, usually at a discounted rate, until a room becomes available. [phone]. The [employer], located to the right of [facility] provides temporary lodging to families of children receiving hospital treatment. [phone].    For more information on local motels and a list of private homes providing rental rooms, contact Social Services at [phone].    SPIRITUAL CARE:  [facility] has chaplains available every day for your spiritual needs. Our Interfaith Prayer and Meditation Room is located at [facility] on the first floor between the [employer] and elevators. You may ask your nurse or staff member to contact the on-call chaplain anytime.  For questions regarding instructions please contact PEC at [phone]. Leave a message if no answer.      CANCEL/PROCEDURE/SURGERY:  If you must cancel your procedure, call [phone] (Option 4) between 6 am and 5:30 pm. After 5:30 pm, call [facility] at [phone].

## 2023-10-16 NOTE — Patient Instructions (Signed)
 MEDICATION INSTRUCTIONS     Take the following medications the morning of surgery: Wellbutrin , zyrtec, levothyroxine , Protonix ,      Hold the following medications the day of surgery: losartan      Hold aspirin  7 days before surgery     Hold Mounjaro at least 7 days prior to surgery.      Hold NSAIDs (Motrin, Advil, Aleve, Ibuprofen, Naprosyn), vitamins, fish oil, and herbal supplements 7 days prior to the procedure.        DIET INSTRUCTIONS   Full liquid diet starting the morning of the day before procedure/surgery, continue full liquid diet until 8 hours prior to procedure/surgery arrival time, consume clear liquids up until 3 hours before procedure/surgery

## 2023-10-16 NOTE — H&P (Signed)
 [facility]  Pre-Operative H&P    [name], Courtney Irani, 58 y.o. female  Date of Birth:  08-Mar-1966  Date of service: 10/16/2023    Information Obtained from: patient  Chief Complaint: L foot 2nd digit pain.    PCP: Audery Lean, MD       HPI: (must include no less than 4 of the following main descriptors) Location (of pain): Quality (character of pain) Severity (minimal, mild, severe, scale or 1-10) Duration (how long has pain/sx present) Timing (when does pain/sx occur)  Context (activity at/before onset) Modifying Factors (what makes pain/sx  Better/worse) Associate Sign/Sx (what accompanies main pain/sx)     Courtney [name] is a 58 y.o., White female who presents with L foot 2nd digit pain. Rates current pain 0/10. Pt reports her L 2nd digit overlaps her 3rd digit and causes her pain. She states her remaining hammertoes do not bother her. Hx of s/p right foot exostectomy of prominent sesamoid bones, hallux IPJ fusion with screw fixation, arthroplasty with temporary K-wire fixation digits 2, 3, 4 and 5th digit derotational arthroplasty; DOS 12/06/2021. Past surgical hx includes lesser digit hammertoe repair to toes 2 -4 in the 1990's. 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  She is a former smoker.  She presents to clinic today with her spouse.     Pre-operative Risk Assessment   Previously completed within 30 days (Dr. Bevin Bucks, 10/01/2023)    ROS:  MUST comment on all "Abnormal" findings   ROS Other than ROS in the HPI, all other systems were negative.    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/2018    iron Deficiency     Aneurysm (CMS HCC)     spleen    Anxiety     Arthritis     Asthma 12/29/2018    childhood, no inhalers since    Back problem     Constipation     CPAP (continuous positive airway pressure) dependence     Disorder of liver     Fatty  Liver    Eczema 12/29/2018    hands x 1 year- eczema- seen derm    Esophageal reflux 12/29/2018    controlled    Fibromyalgia     Headache     Heart murmur 12/29/2018    Reports she was told this in her 18s    Hx of transfusion 12/29/2018    denies rx    Hyperlipidemia     Hypertension     Hypothyroid     Migraine     Nausea with vomiting     Neck problem     Peripheral edema 12/29/2018    chronic left lower extremity swelling     Peripheral neuropathy     Peripheral vascular disease (CMS HCC)     left lower extremity     PONV (postoperative nausea and vomiting)     Pre-diabetes     Problems with swallowing     Shortness of breath 12/29/2018    DOE- several years    Sleep apnea 12/29/2018    doesn't wear CPAP     Thyroid  disease     Type 2 diabetes mellitus     Diagnosed 06/2021, Hgba1c- ?    Wears glasses          Allergies  Allergen Reactions    Macrodantin [Nitrofurantoin] Shortness of Breath    Macrobid [Nitrofurantoin Monohyd/M-Cryst]     Morphine  Swelling     Facial swelling    Zofran  [Ondansetron ] Nausea/ Vomiting      Cannot display prior to admission medications because the patient has not been admitted in this contact.              Past Surgical History:   Procedure Laterality Date    ANKLE SURGERY Right     BLADDER SURGERY      CESAREAN SECTION      x 3    COLONOSCOPY      HX BACK SURGERY      lumbar fusion x2, rods, screws    HX CARPAL TUNNEL RELEASE Bilateral 2020    HX FOOT SURGERY      HX HIP REPLACEMENT Bilateral     Right infected and revised    HX HYSTERECTOMY      HX UPPER ENDOSCOPY      LAMINECTOMY      fusion, x2    LEG SURGERY Left     x 3 , skin graft - from dog bite         Family Medical History:    None         Social History     Tobacco Use    Smoking status: Former     Current packs/day: 0.00     Average packs/day: 1 pack/day for 25.0 years (25.0 ttl pk-yrs)     Types: Cigarettes     Start date: 08/07/1995     Quit date: 08/06/2020     Years since quitting: 3.1    Smokeless tobacco: Never     Tobacco comments:     States a few cigarettes over the last few weeks    Vaping Use    Vaping status: Never Used   Substance Use Topics    Alcohol  use: Not Currently    Drug use: Not Currently         PHYSICAL EXAMINATION: MUST comment on all "Abnormal" findings    Exam  BP 134/70   Pulse 74   Temp 36.6 C (97.9 F)   Ht 1.626 m (5\' 4" )   Wt 78 kg (172 lb)   BMI 29.52 kg/m       General: appears in good health, comfortable  Eyes: Per most recent [facility] clinic/consult note  HENT:Head atraumatic and normocephalic  Neck: no thyromegaly or lymphadenopathy  Lungs: Breathing nonlabored  Cardiovascular: regular rate and rhythm on the monitor  Abdomen: non-distended  Extremities: Left foot hammertoe deformities to left 3 lesser toes; partially reducible. Left 2nd digit noted overlapping 3rd digit and in abducted position.   Skin: Right foot multiple surgical incisions well healed.  R hallux nail noted to be absent. Skin warm and dry  Neurologic: Grossly normal. Alert and oriented x3  Lymphatics: No lymphadenopathy  Psychiatric: Normal      IMPRESSION:    1. Right foot exostectomy of prominent sesamoid bones, hallux IPJ fusion with screw fixation, arthroplasty with temporary K-wire fixation digits 2, 3, 4 and 5th digit derotational arthroplasty; DOS 12/06/2021.  Surgical site appears stable, healing well and with no acute signs of infection  2.  Hx of Right  2nd -4th digit hammertoe surgery with recurrence.  3. Left foot 2nd digit hammertoe abducted and overlapping 3rd digit with pain; failed conservative treatment.   4. DM 2 (HgbA1c 5.7 12/29/2018)  Recommendations       Plan for L foot 2nd digit arthroplasty with temporary K-wire fixation.       - MAC anesthesia with preoperative ankle anesthetic block.  - Post-op shoe  - Pt has crutches and knee scooter at home.   - Percocet 5/325mg  PO q 6hrs x 3 days     Opioid Prescription - First Prescription  Diagnosis requiring prescription: post operative pain control for above  planned procedure    Medication Dosage/Frequency being prescribed: Percocet 5/325 PO Q 6 hours x 3 days    I have reviewed prior medication history in the medical record for this patient.  I have also reviewed information contained in the state controlled prescription drug monitoring database.    I have discussed any history of non-pharmacological treatment with the patient.  The patient reports no prior treatment history.      The patient denies history of substance abuse treatment.      Physical exam findings and/or clinical history warranting use of opioid treatment include: post operative pain control for above planned procedure    My goals for treatment include: Preoperative popliteal/adductor anesthetic block and lowest effective dose 5/325 mg    I have discussed the risk of opioid addiction with the patient.  I have also discussed the risk of using sedatives and alcohol  while taking opioids.          DVT/PE Prophylaxis: Not indicated, low risk for VTE    Patient has decision making capacity:  yes  Advance Directive:  No Advance Directive  DNR Status prior to admission:  Full Code  DNR Status this admission:  Full Code    Disposition Planning: Home discharge       I am scribing for, and in the presence of, Alen Amy, DPM, for services provided on 10/16/2023.  [name], SCRIBE      I personally performed the services described in this documentation, as scribed  in my presence, and it is both accurate  and complete.    Alen Amy, DPM

## 2023-10-23 ENCOUNTER — Encounter (HOSPITAL_COMMUNITY)
Admission: RE | Disposition: A | Discharge status: Home / Self Care | Payer: Self-pay | Source: Ambulatory Visit | Attending: Podiatrist

## 2023-10-23 ENCOUNTER — Ambulatory Visit
Admission: RE | Admit: 2023-10-23 | Discharge: 2023-10-23 | Disposition: A | Discharge status: Home / Self Care | Payer: 59 | Source: Ambulatory Visit | Attending: Podiatrist | Admitting: Podiatrist

## 2023-10-23 ENCOUNTER — Ambulatory Visit (HOSPITAL_COMMUNITY): Payer: Self-pay

## 2023-10-23 ENCOUNTER — Ambulatory Visit (HOSPITAL_COMMUNITY)

## 2023-10-23 ENCOUNTER — Other Ambulatory Visit: Payer: Self-pay

## 2023-10-23 ENCOUNTER — Ambulatory Visit (HOSPITAL_BASED_OUTPATIENT_CLINIC_OR_DEPARTMENT_OTHER): Payer: Self-pay

## 2023-10-23 ENCOUNTER — Encounter (HOSPITAL_COMMUNITY): Payer: Self-pay | Admitting: Podiatrist

## 2023-10-23 ENCOUNTER — Ambulatory Visit (HOSPITAL_COMMUNITY): Admitting: Podiatrist

## 2023-10-23 DIAGNOSIS — K219 Gastro-esophageal reflux disease without esophagitis: Secondary | ICD-10-CM | POA: Insufficient documentation

## 2023-10-23 DIAGNOSIS — E119 Type 2 diabetes mellitus without complications: Secondary | ICD-10-CM | POA: Insufficient documentation

## 2023-10-23 DIAGNOSIS — I10 Essential (primary) hypertension: Secondary | ICD-10-CM | POA: Insufficient documentation

## 2023-10-23 DIAGNOSIS — F419 Anxiety disorder, unspecified: Secondary | ICD-10-CM | POA: Insufficient documentation

## 2023-10-23 DIAGNOSIS — M25572 Pain in left ankle and joints of left foot: Secondary | ICD-10-CM

## 2023-10-23 DIAGNOSIS — E785 Hyperlipidemia, unspecified: Secondary | ICD-10-CM | POA: Insufficient documentation

## 2023-10-23 DIAGNOSIS — D649 Anemia, unspecified: Secondary | ICD-10-CM | POA: Insufficient documentation

## 2023-10-23 DIAGNOSIS — Z87891 Personal history of nicotine dependence: Secondary | ICD-10-CM | POA: Insufficient documentation

## 2023-10-23 DIAGNOSIS — Z9989 Dependence on other enabling machines and devices: Secondary | ICD-10-CM | POA: Insufficient documentation

## 2023-10-23 DIAGNOSIS — M797 Fibromyalgia: Secondary | ICD-10-CM | POA: Insufficient documentation

## 2023-10-23 DIAGNOSIS — K9189 Other postprocedural complications and disorders of digestive system: Secondary | ICD-10-CM | POA: Insufficient documentation

## 2023-10-23 DIAGNOSIS — J45909 Unspecified asthma, uncomplicated: Secondary | ICD-10-CM | POA: Insufficient documentation

## 2023-10-23 DIAGNOSIS — G4730 Sleep apnea, unspecified: Secondary | ICD-10-CM | POA: Insufficient documentation

## 2023-10-23 DIAGNOSIS — M2042 Other hammer toe(s) (acquired), left foot: Secondary | ICD-10-CM

## 2023-10-23 DIAGNOSIS — I739 Peripheral vascular disease, unspecified: Secondary | ICD-10-CM | POA: Insufficient documentation

## 2023-10-23 DIAGNOSIS — M539 Dorsopathy, unspecified: Secondary | ICD-10-CM | POA: Insufficient documentation

## 2023-10-23 DIAGNOSIS — R519 Headache, unspecified: Secondary | ICD-10-CM | POA: Insufficient documentation

## 2023-10-23 DIAGNOSIS — E039 Hypothyroidism, unspecified: Secondary | ICD-10-CM | POA: Insufficient documentation

## 2023-10-23 LAB — POC BLOOD GLUCOSE (RESULTS)
GLUCOSE, POC: 90 mg/dL (ref 65–125)
GLUCOSE, POC: 106 mg/dL (ref 65–125)

## 2023-10-23 SURGERY — ARTHROPLASTY HAMMERTOE CORRECTION
Anesthesia: Monitor Anesthesia Care | Site: Foot | Laterality: Left | Wound class: Clean Wound: Uninfected operative wounds in which no inflammation occurred

## 2023-10-23 MED ORDER — DEXMEDETOMIDINE 4 MCG/ML IV DILUTION
Freq: Once | INTRAMUSCULAR | Status: DC | PRN
Start: 2023-10-23 — End: 2023-10-23
  Administered 2023-10-23: 10 ug via INTRAVENOUS
  Administered 2023-10-23: 8 ug via INTRAVENOUS

## 2023-10-23 MED ORDER — ONDANSETRON HCL (PF) 4 MG/2 ML INJECTION SOLUTION
INTRAMUSCULAR | Status: AC
Start: 2023-10-23 — End: 2023-10-23
  Filled 2023-10-23: qty 2

## 2023-10-23 MED ORDER — SODIUM CHLORIDE 0.9 % (FLUSH) INJECTION SYRINGE
2.0000 mL | INJECTION | INTRAMUSCULAR | Status: DC | PRN
Start: 2023-10-23 — End: 2023-10-23

## 2023-10-23 MED ORDER — PROPOFOL 10 MG/ML INTRAVENOUS EMULSION
INTRAVENOUS | Status: DC | PRN
Start: 2023-10-23 — End: 2023-10-23
  Administered 2023-10-23: 100 ug/kg/min via INTRAVENOUS
  Administered 2023-10-23: 150 ug/kg/min via INTRAVENOUS
  Administered 2023-10-23: 0 ug/kg/min via INTRAVENOUS
  Administered 2023-10-23: 50 ug/kg/min via INTRAVENOUS
  Administered 2023-10-23: 200 ug/kg/min via INTRAVENOUS
  Administered 2023-10-23: 150 ug/kg/min via INTRAVENOUS

## 2023-10-23 MED ORDER — PHENYLEPHRINE 50 MG/250 ML (200 MCG/ML) IN 0.9 % SODIUM CHLORIDE IV
INTRAVENOUS | Status: DC | PRN
Start: 2023-10-23 — End: 2023-10-23
  Administered 2023-10-23: .4 ug/kg/min via INTRAVENOUS
  Administered 2023-10-23: .2 ug/kg/min via INTRAVENOUS
  Administered 2023-10-23: 0 ug/kg/min via INTRAVENOUS

## 2023-10-23 MED ORDER — MIDAZOLAM 1 MG/ML INJECTION WRAPPER
2.0000 mg | Freq: Once | INTRAMUSCULAR | Status: DC | PRN
Start: 2023-10-23 — End: 2023-10-23
  Administered 2023-10-23: 1 mg via INTRAVENOUS
  Filled 2023-10-23: qty 2

## 2023-10-23 MED ORDER — FENTANYL (PF) 50 MCG/ML INJECTION SOLUTION
Freq: Once | INTRAMUSCULAR | Status: DC | PRN
Start: 2023-10-23 — End: 2023-10-23
  Administered 2023-10-23: 25 ug via INTRAVENOUS

## 2023-10-23 MED ORDER — LIDOCAINE (PF) 10 MG/ML (1 %) INJECTION SOLUTION
INTRAMUSCULAR | Status: AC
Start: 2023-10-23 — End: 2023-10-23
  Filled 2023-10-23: qty 30

## 2023-10-23 MED ORDER — LIDOCAINE (PF) 20 MG/ML (2 %) INJECTION SOLUTION
INTRAMUSCULAR | Status: AC
Start: 2023-10-23 — End: 2023-10-23
  Filled 2023-10-23: qty 5

## 2023-10-23 MED ORDER — BUPIVACAINE (PF) 0.5 % (5 MG/ML) INJECTION SOLUTION
INTRAMUSCULAR | Status: AC
Start: 2023-10-23 — End: 2023-10-23
  Filled 2023-10-23: qty 30

## 2023-10-23 MED ORDER — SODIUM CHLORIDE 0.9 % (FLUSH) INJECTION SYRINGE
20.0000 mL | INJECTION | Freq: Once | INTRAMUSCULAR | Status: DC | PRN
Start: 2023-10-23 — End: 2023-10-23

## 2023-10-23 MED ORDER — FENTANYL (PF) 50 MCG/ML INJECTION SOLUTION
INTRAMUSCULAR | Status: AC
Start: 2023-10-23 — End: 2023-10-23
  Filled 2023-10-23: qty 2

## 2023-10-23 MED ORDER — LACTATED RINGERS INTRAVENOUS SOLUTION
INTRAVENOUS | Status: DC
Start: 2023-10-23 — End: 2023-10-23
  Administered 2023-10-23: 0 via INTRAVENOUS

## 2023-10-23 MED ORDER — DEXAMETHASONE SODIUM PHOSPHATE 4 MG/ML INJECTION SOLUTION
INTRAMUSCULAR | Status: AC
Start: 2023-10-23 — End: 2023-10-23
  Filled 2023-10-23: qty 1

## 2023-10-23 MED ORDER — PHENYLEPHRINE 1 MG/10 ML (100 MCG/ML) IN 0.9 % SOD.CHLORIDE IV SYRINGE
INJECTION | Freq: Once | INTRAVENOUS | Status: DC | PRN
Start: 2023-10-23 — End: 2023-10-23
  Administered 2023-10-23 (×2): 100 ug via INTRAVENOUS
  Administered 2023-10-23: 200 ug via INTRAVENOUS

## 2023-10-23 MED ORDER — PROPOFOL 10 MG/ML IV BOLUS
INJECTION | Freq: Once | INTRAVENOUS | Status: DC | PRN
Start: 2023-10-23 — End: 2023-10-23
  Administered 2023-10-23: 60 mg via INTRAVENOUS
  Administered 2023-10-23: 100 mg via INTRAVENOUS

## 2023-10-23 MED ORDER — OXYCODONE-ACETAMINOPHEN 5 MG-325 MG TABLET
1.0000 | ORAL_TABLET | Freq: Three times a day (TID) | ORAL | 0 refills | Status: AC | PRN
Start: 2023-10-23 — End: 2023-10-26
  Filled 2023-10-23: qty 9, 3d supply, fill #0

## 2023-10-23 MED ORDER — SODIUM CHLORIDE 0.9% FLUSH BAG - 250 ML
INTRAVENOUS | Status: DC | PRN
Start: 2023-10-23 — End: 2023-10-23

## 2023-10-23 MED ORDER — SODIUM CHLORIDE 0.9 % (FLUSH) INJECTION SYRINGE
2.0000 mL | INJECTION | Freq: Three times a day (TID) | INTRAMUSCULAR | Status: DC
Start: 2023-10-23 — End: 2023-10-23

## 2023-10-23 MED ORDER — WATER FOR INJECTION, STERILE INJECTION SOLUTION
2.0000 g | Freq: Once | INTRAMUSCULAR | Status: AC
Start: 2023-10-23 — End: 2023-10-23
  Administered 2023-10-23: 2 g via INTRAVENOUS
  Filled 2023-10-23: qty 20

## 2023-10-23 MED ORDER — PHENYLEPHRINE 1 MG/10 ML (100 MCG/ML) IN 0.9 % SOD.CHLORIDE IV SYRINGE
INJECTION | INTRAVENOUS | Status: AC
Start: 2023-10-23 — End: 2023-10-23
  Filled 2023-10-23: qty 10

## 2023-10-23 MED ORDER — EPHEDRINE SULFATE 5 MG/ML INTRAVENOUS SOLUTION
INTRAVENOUS | Status: AC
Start: 2023-10-23 — End: 2023-10-23
  Filled 2023-10-23: qty 10

## 2023-10-23 MED ORDER — ROPIVACAINE (PF) 5 MG/ML (0.5 %) INJECTION SOLUTION
30.0000 mL | Freq: Once | INTRAMUSCULAR | Status: DC | PRN
Start: 2023-10-23 — End: 2023-10-23
  Administered 2023-10-23: 19 mL via INTRAMUSCULAR

## 2023-10-23 MED ORDER — ROCURONIUM 10 MG/ML INTRAVENOUS SYRINGE WRAPPER
INJECTION | INTRAVENOUS | Status: AC
Start: 2023-10-23 — End: 2023-10-23
  Filled 2023-10-23: qty 5

## 2023-10-23 MED ORDER — ACETAMINOPHEN 1,000 MG/100 ML (10 MG/ML) INTRAVENOUS SOLUTION
Freq: Once | INTRAVENOUS | Status: DC | PRN
Start: 2023-10-23 — End: 2023-10-23
  Administered 2023-10-23: 1000 mg via INTRAVENOUS

## 2023-10-23 MED ORDER — DEXAMETHASONE SODIUM PHOSPHATE 4 MG/ML INJECTION SOLUTION
Freq: Once | INTRAMUSCULAR | Status: DC | PRN
Start: 2023-10-23 — End: 2023-10-23
  Administered 2023-10-23: 8 mg via INTRAVENOUS

## 2023-10-23 MED ORDER — SODIUM CHLORIDE 0.9 % IRRIGATION SOLUTION
1000.0000 mL | Status: DC | PRN
Start: 2023-10-23 — End: 2023-10-23
  Administered 2023-10-23: 1000 mL

## 2023-10-23 MED ORDER — DEXTROSE 5% IN WATER (D5W) FLUSH BAG - 250 ML
INTRAVENOUS | Status: DC | PRN
Start: 2023-10-23 — End: 2023-10-23

## 2023-10-23 MED ORDER — PROPOFOL 10 MG/ML INTRAVENOUS EMULSION
INTRAVENOUS | Status: AC
Start: 2023-10-23 — End: 2023-10-23
  Filled 2023-10-23: qty 20

## 2023-10-23 MED ORDER — LIDOCAINE (PF) 100 MG/5 ML (2 %) INTRAVENOUS SYRINGE
INJECTION | Freq: Once | INTRAVENOUS | Status: DC | PRN
Start: 2023-10-23 — End: 2023-10-23
  Administered 2023-10-23: 80 mg via INTRAVENOUS

## 2023-10-23 MED ORDER — FENTANYL (PF) 50 MCG/ML INJECTION SOLUTION
100.0000 ug | Freq: Once | INTRAMUSCULAR | Status: DC | PRN
Start: 2023-10-23 — End: 2023-10-23
  Administered 2023-10-23: 50 ug via INTRAVENOUS
  Filled 2023-10-23: qty 2

## 2023-10-23 SURGICAL SUPPLY — 28 items
APPL 70% ISPRP 2% CHG 26ML CHLRPRP HI-LT ORNG PREP STRL LF  DISP CLR (MED SURG SUPPLIES) ×2 IMPLANT
BANDAGE ESMARK 12FTX4IN STRL SYN COMPRESS LF (WOUND CARE SUPPLY) ×1 IMPLANT
BANDAGE MATRIX 5YDX4IN NONST ELAS HKLP CLSR PLSTR COTTON MED COMPRESS LF  DISP (WOUND CARE SUPPLY) ×1 IMPLANT
BLADE SAW 18.5X5.5X.51MM AGRS SS MED NRW COR TPS STRL (SURGICAL CUTTING SUPPLIES) ×1 IMPLANT
BLADE SAW 9MM OSCILLATE SGTL SS THK.51MM 31MM MED LONG PREC STRL LF  DISP (SURGICAL CUTTING SUPPLIES) IMPLANT
BLANKET MISTRAL-AIR ADULT UPR BODY 79X29.9IN FRC AIR HI VOL BLWR INTUITIVE CONTROL PNL LRG LED (MED SURG SUPPLIES) ×1 IMPLANT
COVER REINF OVRHD HI BARRIER STRTH LF  DISP REG 90X77IN TBL STRL SMS (DRAPE/PACKS/SHEETS/OR TOWEL) ×1 IMPLANT
CUFF TOURNIQUET RD 18X4IN COLOR CUF CYL 2 PORT BLADDER QC LOW PROF STRL LF  DISP (MED SURG SUPPLIES) ×1 IMPLANT
CUSTOM EXTREMITY ~~LOC~~ - RUBY MEMORIAL HOSPITAL (CUSTOM TRAYS & PACK) ×1 IMPLANT
DEVICE DRUG DEL 20MM STRL LF (MED SURG SUPPLIES) IMPLANT
DEVICE SKNCLS ZIP 4- CM INCS STRL LF  DISP (SUTURE AIDS) IMPLANT
DEVICE SKNCLS ZIP 8CM STRL LF  DISP (SUTURE AIDS) IMPLANT
DRAPE FLRSCN CARM STRAP 85X54IN MINI KOVER LF  STRL EQP POLY (DRAPE/PACKS/SHEETS/OR TOWEL) ×1 IMPLANT
DRAPE TWL PLASTIC ADH 23X17IN LRG STRDRP STRL SURG TRNSPR (DRAPE/PACKS/SHEETS/OR TOWEL) IMPLANT
DRESS WOUND 4X4IN JUMPSTART ANTIMIC ORDER IN MULTIPLES OF 10 EACH (WOUND CARE SUPPLY) IMPLANT
ELECTRODE ESURG 360D PNCL STD 10FT 3/32IN VLAB STRL DISP SMOKE EVAC ATTACH CORD HLSTR (SURGICAL CUTTING SUPPLIES) ×1 IMPLANT
EXTREMITY ~~LOC~~ - RUBY MEMORIAL HOSPITAL (CUSTOM TRAYS & PACK) ×1 IMPLANT
GARMENT COMPRESS MED CALF CENTAURA NYL VASOGRAD LTWT BRTHBL SEQ FIL BLU 18- IN (MED SURG SUPPLIES) ×1 IMPLANT
NEEDLE FR EYE INTES .748IN_1/2 CIRCLE 2129-5 (SUTURE/WOUND CLOSURE) IMPLANT
PADDING CAST 4YDX4IN SFRL COTTON ABS UNDCST STRL LF (ORTHOPEDICS (NOT IMPLANTS)) ×2 IMPLANT
RASP SURG 11X5MM SM TPS SS TEAR XCUT DISP STRL LF (SURGICAL CUTTING SUPPLIES) ×1 IMPLANT
RASP SURG 14X7MM LRG TPS SS TEAR XCUT DISP STRL LF (SURGICAL CUTTING SUPPLIES) IMPLANT
SET EXTENSION 40IN 2 QUICK CONNECT TUBE STERILE (MED SURG SUPPLIES) ×1 IMPLANT
SHOE POSTOP 6.5-8 MED FEMALE BGE CANVAS RUB RIGID SOLE HKLP CLSR CLS HEEL OPN TOE LF (ORTHOPEDICS (NOT IMPLANTS)) ×1 IMPLANT
SPONGE GAUZE 4X4IN MDCHC COTTON 12 PLY TY 7 LF  STRL DISP (WOUND CARE SUPPLY) ×1 IMPLANT
STKNT ORTHO 72X6IN COTTON ALBHL 1 PLY PCUT LF  TUB STRL NATURAL (ORTHOPEDICS (NOT IMPLANTS)) ×1 IMPLANT
TIP SUCT ARGYLE CURITY FRZR 12FR BRSS PLASTIC NI REM OBTURATOR NCDTV HNDL PLBL STRL LF  DISP (ENDOSCOPIC SUPPLIES) ×1 IMPLANT
WIRE FIX .045IN 5IN CWR TROCAR TIP LF (IMPLANTS TRAUMA) ×1 IMPLANT

## 2023-10-23 NOTE — OR Surgeon (Addendum)
 PATIENT NAME: Courtney [name] [facility] NUMBER: [id number]  DATE OF SERVICE: 10/23/2023  DATE OF BIRTH: [birth year]      Pre-Operative Diagnosis:   Left foot 2nd digit abducted hammertoe deformity     Post-Operative Diagnosis:   Left foot 2nd digit abducted hammertoe deformity     Procedure(s)/Description:     Left foot 2nd digit hammertoe correction with K-wire fixation    Findings/Complexity (inherent to the procedure performed):  2nd digit abducted hammertoe deformity requiring lateral capsular release     Attending Surgeon: Alen Amy, DPM  Assistant(s):  SF Corpsman Haaris [place],  SF Medic Daleen Dubs    Note-the above corpsman and medic performed approximately 50% of the surgery under my direct supervision and with my direct surgical assistance    Anesthesia Type: Monitored Anesthesia Care (MAC) and ankle block  Estimated Blood Loss:  Minimal  Hemostasis:  Sterile ankle tourniquet  Blood Given:  None  Fluids Given:  See anesthesia note    Complications (not routinely expected or not inherent to difficulty/nature of procedure):  None    Characteristic Event (routinely expected or inherent to the difficulty/nature of the procedure): 2nd digit abducted hammertoe deformity requiring lateral capsular release     Did the use of current and/or prior Anticoagulants impact the outcome of the case? N\A    Wound Class: Clean Wound: Uninfected operative wounds in which no inflammation occurred    Tubes: None  Drains: None  Specimens/ Cultures:  None  Implants:  0.045 K-wire           Disposition: PACU - hemodynamically stable.  Condition: stable    Procedure in detail:    The patient received a preoperative ankle block in the preoperative area by anesthesia.  The patient  was then brought into the operating room and placed on the operating table in a supine position.  A time-out was performed to verify the patient's name, procedure, and side of procedure using the patient's consent form.  Following 2 grams of IV  Ancef , and  IV sedation, the left lower extremity was then prepped and draped in the usual sterile manner.  An Esmarch bandage was applied for exsanguination purposes, and the ankle tourniquet was then inflated to 250 mmHg.    Attention was then directed to the 2nd digit where a linear longitudinal incision was made dorsally over the apex of the deformity at the level of the proximal interphalangeal joint and extending to the metatarsophalangeal joint.  The incision was deepened down to the capsular structures via sharp and blunt dissection.  The collateral ligaments were released and the extensor tendon reflected proximally to the level of the metatarsophalangeal joint. A capsulotomy was then performed exposing the head of the proximal phalanx and base of the intermediate phalanx. Using an oscillating bone saw, the head of the proximal phalanx  was resected.  Mini C-arm was used to confirm adequate resection of bone.  Further reduction of the deformity was determined to be needed therefore, the flexor tendons were identified through the dorsal incision and transected.  Proximal phalanx was noted to still be in an abducted position laterally and therefore, a lateral capsulotomy was done at the level of the metatarsophalangeal joint. A 0.045 K-wire was then advanced through the 2nd digit prior to soft tissue closing, crossing the  proximal interphalangeal and metatarsophalangeal joint.  Mini C-arm was used to confirm adequate placement of the K-wire and reduction of the deformity.  The wire was then bent, cut and  capped with a Jurgan ball.  The area was then copiously flushed with normal sterile saline.  The extensor tendon was re-approximated with 4-0 Vicryl, the soft tissue structures were then closed with 4-0 Vicryl and the skin was closed with 4-0 Prolene using horizontal and simple suture technique.  The surgical sites was then dressed with Xeroform, dry 4 x 4 gauze, soft roll, and ACE wrap. A post op shoe was  then placed on the left foot.    The patient tolerated procedure and anesthesia well, and left the OR for the recovery room with vital signs stable and vascular status intact as indicated by immediate hyperemia to digits 1 through 5 of the left foot upon release of the ankle tourniquet.    The patient will be discharged home when medically stable in fully recovered from anesthesia.      Alen Amy, DPM  Asst Professor - Dept of Orthopaedics  Podiatry  Pager [phone]

## 2023-10-23 NOTE — Progress Notes (Signed)
 [facility]  Discharge Day Note    Courtney [name]  Date of service: 10/23/2023  Date of Admission:  10/23/2023  Hospital Day:  LOS: 0 days       Examination at discharge:  Vital Signs:  Temperature: 36.8 C (98.2 F) (10/23/23 0624)  Heart Rate: 75 (10/23/23 0630)  BP (Non-Invasive): 131/76 (10/23/23 0630)  Respiratory Rate: 17 (10/23/23 0630)  SpO2: 93 % (10/23/23 0630)  Constitutional: appears in good health, comfortable  Eyes: Per most recent [facility] clinic/consult note  ENT: Head atraumatic and normocephalic  Neck: no thyromegaly or lymphadenopathy  Respiratory: Breathing nonlabored  Gastrointestinal: non-distended  Genitourinary: Deferred  Musculoskeletal: Head atraumatic and normocephalic  Integumentary:  Skin warm and dry  Neurologic: Grossly normal. Alert and oriented x3  Lymphatic/Immunologic/Hematologic: No lymphadenopathy  Psychiatric: Normal      Assessment/ Plan:   There are no active hospital problems to display for this patient.    Patient is s/p Left foot 2nd digit arthroplasty with temporary K-wire fixation    - Post op shoe placed  - Post op instructions under media  - Post op appts scheduled    Disposition : Home discharge      Alen Amy, DPM  Asst Professor - Dept of Orthopaedics  Podiatry  Pager [phone]       Alen Amy, [place]

## 2023-10-23 NOTE — Anesthesia Procedure Notes (Addendum)
 Courtney [name]    Block: Peripheral Block    Performed By:   Binnie Buffalo Provider:  Pat Bonier, MD  Performing Provider:  Degolier, Fairy Homer, DO  I was present and supervised/observed the entire procedure.  Pat Bonier, MD 10/23/2023, 07:39     Sedation    Sedation Start Time  10/23/2023 6:24 AM   Sedation Stop Time 10/23/2023 6:33 AM  Blocks  Left ankle by anesthesia At Bedside  Type of Block: single shot    Ultrasound was used to identify the nerve structure(s) along with needle placement during the procedure    Diagnosis: ankle pain, foot pain   Indication: Requested by surgeon   Pt location: at Bedside    Pre anesthesia checklist: H&P updated and consent obtained, Patient positioned, Patient monitors applied, Timeout performed:, Site verified, Emergency drugs and equipment available and anesthesia consent given  Technique(See MAR for doses)  Approach:  Peripheral Block   Preprocedure hand washing was performed sterile field maintained          Skin prepped with: Chlorhexidine gluconate and isopropyl alcohol     Skin Local   bupivacaine  0.5%   Needle         Number of attempts: 1      Catheter          Site      Medications    Assessment  Injection assessment: negative aspiration for heme and incremental injection          Events     Patient tolerance of procedure: tolerated well, no immediate complications

## 2023-10-23 NOTE — Nurses Notes (Signed)
 Patient discharged home with family.  AVS reviewed with patient/care giver.  A written copy of the AVS and discharge instructions was given to the patient/care giver.  Questions sufficiently answered as needed.  Patient/care giver encouraged to follow up with PCP as indicated.  In the event of an emergency, patient/care giver instructed to call 911 or go to the nearest emergency room.  PIV removed.

## 2023-10-23 NOTE — Anesthesia Transfer of Care (Signed)
 ANESTHESIA TRANSFER OF CARE   Courtney [name] is a 58 y.o. ,female, Weight: 80 kg (176 lb 5.9 oz)   had Procedure(s):  ARTHROPLASTY HAMMERTOE CORRECTION  performed  10/23/23   Primary Service: Alen Amy, DPM    Past Medical History:   Diagnosis Date    Anemia 12/29/2018    iron Deficiency     Aneurysm (CMS HCC)     spleen    Anxiety     Arthritis     Asthma 12/29/2018    childhood, no inhalers since    Back problem     Constipation     CPAP (continuous positive airway pressure) dependence     Disorder of liver     Fatty Liver    Eczema 12/29/2018    hands x 1 year- eczema- seen derm    Esophageal reflux 12/29/2018    controlled    Fibromyalgia     Headache     Heart murmur 12/29/2018    Reports she was told this in her 48s    Hx of transfusion 12/29/2018    denies rx    Hyperlipidemia     Hypertension     Hypothyroid     Migraine     Nausea with vomiting     Neck problem     Peripheral edema 12/29/2018    chronic left lower extremity swelling     Peripheral neuropathy     Peripheral vascular disease (CMS HCC)     left lower extremity     PONV (postoperative nausea and vomiting)     Pre-diabetes     Problems with swallowing     Shortness of breath 12/29/2018    DOE- several years    Sleep apnea 12/29/2018    doesn't wear CPAP     Thyroid  disease     Type 2 diabetes mellitus     Diagnosed 06/2021, Hgba1c- ?    Wears glasses       Allergy History as of 10/23/23       MORPHINE          Noted Status Severity Type Reaction    10/08/22 1746 Lenice Quill, RN 08/30/14 Active Low  Swelling    Comments: Facial swelling     06/08/17 1506 Mike Alcon, RN 08/30/14 Active Low  Swelling    08/30/14 1056 Sandford Croon 08/30/14 Active                 NITROFURANTOIN         Noted Status Severity Type Reaction    06/08/17 1506 Mike Alcon, RN 08/30/14 Active High  Shortness of Breath    08/30/14 1056 Sandford Croon 08/30/14 Active                 OTHER         Noted Status Severity Type Reaction    12/29/18  1649 Alderson, [place], RN 06/08/17 Deleted       Comments: Patient states an anti nausea medication caused her to "keep throwing up" after receiving it. She doesn't remember the name of this medications but states "Zofran  sounds familiar"      06/08/17 1515 Mike Alcon, RN 06/08/17 Active       Comments: Patient states an anti nausea medication caused her to "keep throwing up" after receiving it. She doesn't remember the name of this medications but states "Zofran  sounds familiar"                ONDANSETRON   Noted Status Severity Type Reaction    12/29/18 1648 Alderson, [place], RN 12/29/18 Active Low  Nausea/ Vomiting              NITROFURANTOIN MONOHYD/M-CRYST         Noted Status Severity Type Reaction    05/01/21 0950 Dewey Fordyce 05/01/21 Active                     I completed my transfer of care / handoff to the receiving personnel during which we discussed:  Access, Airway, All key/critical aspects of case discussed, Analgesia, Antibiotics, Expectation of post procedure, Fluids/Product, Gave opportunity for questions and acknowledgement of understanding, Labs and PMHx      Post Location: Phase II                        Additional Info:Pt transferred to Phase II on RA. VSS, airway patent, RR regular and unlabored. Report to Phase II RN, all questions addressed.                                      Last OR Temp: Temperature: 36.2 C (97.2 F)  ABG:  POTASSIUM   Date Value Ref Range Status   10/09/2022 3.9 3.5 - 5.1 mmol/L Final     CALCIUM   Date Value Ref Range Status   10/09/2022 9.0 8.6 - 10.3 mg/dL Final     Calculated P Axis   Date Value Ref Range Status   12/29/2018 53 degrees Final     Calculated R Axis   Date Value Ref Range Status   12/29/2018 39 degrees Final     Calculated T Axis   Date Value Ref Range Status   12/29/2018 53 degrees Final     Airway:* No LDAs found *  Blood pressure 115/81, pulse 75, temperature 36.2 C (97.2 F), resp. rate 12, height 1.626 m (5\' 4" ), weight 80 kg  (176 lb 5.9 oz), SpO2 93%.

## 2023-10-23 NOTE — Anesthesia Postprocedure Evaluation (Signed)
 Anesthesia Post Op Evaluation    Patient: Courtney [name]  Procedure(s):  ARTHROPLASTY HAMMERTOE CORRECTION    Last Vitals:Temperature: 36.2 C (97.2 F) (10/23/23 0915)  Heart Rate: 73 (10/23/23 0915)  BP (Non-Invasive): 108/62 (10/23/23 0915)  Respiratory Rate: 14 (10/23/23 0915)  SpO2: 90 % (10/23/23 0915)    No notable events documented.    Patient is sufficiently recovered from the effects of anesthesia to participate in the evaluation and has returned to their pre-procedure level.  Patient location during evaluation: PACU       Patient participation: complete - patient participated  Level of consciousness: awake and alert and responsive to verbal stimuli  Multimodal Pain Management: Multimodal analgesia used between 6 hours prior to anesthesia start to PACU discharge  Pain management: adequate  Airway patency: patent    Anesthetic complications: no  Cardiovascular status: acceptable  Respiratory status: acceptable  Hydration status: acceptable  Patient post-procedure temperature: Pt Normothermic   PONV Status: Absent

## 2023-10-23 NOTE — Anesthesia Preprocedure Evaluation (Signed)
 ANESTHESIA PRE-OP EVALUATION  Planned Procedure: ARTHROPLASTY HAMMERTOE CORRECTION (Left: Foot)    Per patient, facial swelling with morphine  and Zofran  worsens nausea    Review of Systems    PONV                 Pulmonary   asthma, sleep apnea and CPAP,   Cardiovascular    Hypertension, PVD and hyperlipidemia ,       GI/Hepatic/Renal    GERD and well controlled        Endo/Other    hypothyroidism and anemia,   type 2 diabetes/ controlled    Neuro/Psych/MS    headaches, back abnormality, fibromyalgia, anxiety    peripheral neuropathy,  Cancer                        Physical Assessment      Airway     Comment: Small mouth, neck soreness with extension    Mallampati: II    TM distance: <3 FB    Neck ROM: limited  Mouth Opening: poor.            Dental                    Pulmonary    Breath sounds clear to auscultation       Cardiovascular    Rhythm: regular  Rate: Normal       Other findings              Plan  ASA 3     Planned anesthesia type: MAC           PONV Plan:  I plan to administer pharmcologic prophalaxis antiemetics  POV PLAN:   plan for postoperative opioid use        Additional Plans: Pre-op Block    Intravenous induction     Anesthesia issues/risks discussed are: Dental Injuries, Nerve Injuries, PONV, Post-op Intubation/Ventilation, Stroke, Aspiration, Intraoperative Awareness/ Recall, Blood Loss, Cardiac Events/MI, Post-op Pain Management, Difficult Airway, Sore Throat, Local Anesthetic Systemic Toxicity, Failure of Block and Post-op Cognitive Dysfunction.  Anesthetic plan and risks discussed with patient  signed consent obtained      Use of blood products discussed with patient who consented to blood products.      Patient's NPO status is appropriate for Anesthesia.

## 2023-10-23 NOTE — Discharge Instructions (Signed)
 SURGICAL DISCHARGE INSTRUCTIONS     Dr. Alen Amy, DPM  performed your ARTHROPLASTY HAMMERTOE CORRECTION today at the [facility]    [facility]:  Monday through Friday from 6 a.m. - 7 p.m.: [phone]  Between 7 p.m. - 6 a.m., weekends and holidays:  Call [facility] at [phone] or [phone].    PLEASE SEE WRITTEN HANDOUTS AS DISCUSSED BY YOUR NURSE:      SIGNS AND SYMPTOMS OF A WOUND / INCISION INFECTION   Be sure to watch for the following:  Increase in redness or red streaks near or around the wound or incision.  Increase in pain that is intense or severe and cannot be relieved by the pain medication that your doctor has given you.  Increase in swelling that cannot be relieved by elevation of a body part, or by applying ice, if permitted.  Increase in drainage, or if yellow / green in color and smells bad. This could be on a dressing or a cast.  Increase in fever for longer than 24 hours, or an increase that is higher than 101 degrees Fahrenheit (normal body temperature is 98 degrees Fahrenheit). The incision may feel warm to the touch.    **CALL YOUR DOCTOR IF ONE OR MORE OF THESE SIGNS / SYMPTOMS SHOULD OCCUR.    ANESTHESIA INFORMATION   ANESTHESIA -- ADULT PATIENTS:  You have received intravenous sedation / general anesthesia, and you may feel drowsy and light-headed for several hours. You may even experience some forgetfulness of the procedure. DO NOT DRIVE A MOTOR VEHICLE or perform any activity requiring complete alertness or coordination until you feel fully awake in about 24-48 hours. Do not drink alcoholic beverages for at least 24 hours. Do not stay alone, you must have a responsible adult available to be with you. You may also experience a dry mouth or nausea for 24 hours. This is a normal side effect and will disappear as the effects of the medication wear off.    REMEMBER   If you experience any difficulty breathing, chest pain, bleeding that you feel is  excessive, persistent nausea or vomiting or for any other concerns:  Call your physician Dr. Kateri Pal at [phone] or [phone]. You may also ask to have the [facility] doctor on call paged. They are available to you 24 hours a day.    SPECIAL INSTRUCTIONS / COMMENTS   See handout    FOLLOW-UP APPOINTMENTS   Please call patient services at [phone] or [phone] to schedule a date / time of return. They are open Monday - Friday from 7:30 am - 5:00 pm.

## 2023-10-23 NOTE — H&P (Addendum)
 [place]  [facility]  H & P UPDATE                                                      Courtney [name], Courtney [name], 58 y.o. female  Date of Admission:  10/23/2023  Date of Birth:  [birth year]    Date:  10/23/2023    STOP: IF H&P IS GREATER THAN 30 DAYS FROM SURGICAL DAY COMPLETE NEW H&P IS REQUIRED.    Outpatient Pre-Surgical H & P updated the day of the procedure.  1.  H&P the patient has been examined, and no change has occured in the patients condition since the H&P was completed.  Bevin Bucks, 09/29/23)      Change in medications: No      Last Menstrual Period: N/A      Comments:     2.  Patient continues to be appropiate candidate for planned surgical procedure. YES    Alen Amy, DPM  Asst Professor - Dept of Orthopaedics  Podiatry  Pager [phone]

## 2023-10-23 NOTE — Consults (Signed)
 [facility]  Acute Perioperative Pain Service Consult         Date of Service:  10/23/2023  Eveny, Pynes, 58 y.o. female  Encounter Start Date:  10/23/2023  Inpatient Admission Date:    Date of Birth:  1965-07-23    Plan:   Courtney [name] is a 58 y.o. female who is scheduled for  Procedure(s) (LRB):  ARTHROPLASTY HAMMERTOE CORRECTION (Left) with anticipated post-operative pain.  - Offered patient Ankle peripheral nerve block. Patient agreeable to procedure.   - Scheduled and PRN medications per primary service.   - For any questions please page or call the Acute Perioperative Pain Service at [phone].   - If not already done, please place consult order to IP Acute Perioperative Pain --RAP Thank you for your consultation.     Suggested Coding/Billing: Level 3    Type of Pain Consultation: Perioperative Pain  Consult Requested By: Alen Amy, DPM [phone]    Reason for Consult: Acute Postoperative Pain (ICD-10-G89.18)  Laterality left, Type of Surgery, Arthroplasty Hammertoe Correction     Chief Complaint/Pain location: post-op pain    History of Present Illness: Courtney [name] is a 58 y.o., White female  with chief complaint as above. Anesthesiology Perioperative Pain service consulted for evaluation of post-op pain. The pain is anticipated to be described as sharp post surgical pain. Pain will be located at the surgical site. Onset is anticipated to start post-operatively. Aggravating factors: movement.  Alleviating factors: pain medications.       Past Medical History:  Past Medical History:   Diagnosis Date    Anemia 12/29/2018    iron Deficiency     Aneurysm (CMS HCC)     spleen    Anxiety     Arthritis     Asthma 12/29/2018    childhood, no inhalers since    Back problem     Constipation     CPAP (continuous positive airway pressure) dependence     Disorder of liver     Fatty Liver    Eczema 12/29/2018    hands x 1 year- eczema- seen derm    Esophageal reflux 12/29/2018     controlled    Fibromyalgia     Headache     Heart murmur 12/29/2018    Reports she was told this in her 80s    Hx of transfusion 12/29/2018    denies rx    Hyperlipidemia     Hypertension     Hypothyroid     Migraine     Nausea with vomiting     Neck problem     Peripheral edema 12/29/2018    chronic left lower extremity swelling     Peripheral neuropathy     Peripheral vascular disease (CMS HCC)     left lower extremity     PONV (postoperative nausea and vomiting)     Pre-diabetes     Problems with swallowing     Shortness of breath 12/29/2018    DOE- several years    Sleep apnea 12/29/2018    doesn't wear CPAP     Thyroid  disease     Type 2 diabetes mellitus     Diagnosed 06/2021, Hgba1c- ?    Wears glasses          OSA diagnosed? yes  History of Anticoagulant Use? No    Social History:   Social History     Tobacco Use    Smoking status: Former  Current packs/day: 0.00     Average packs/day: 1 pack/day for 25.0 years (25.0 ttl pk-yrs)     Types: Cigarettes     Start date: 08/07/1995     Quit date: 08/06/2020     Years since quitting: 3.2    Smokeless tobacco: Never    Tobacco comments:     States a few cigarettes over the last few weeks    Vaping Use    Vaping status: Never Used   Substance Use Topics    Alcohol  use: Not Currently    Drug use: Not Currently         ROS: Constitutional: negative for recent illnesses   Ears, nose, mouth, throat, and face: negative neck stiffness  Respiratory: negative for shortness of breath  Cardiovascular: negative for chest pain and dyspnea  Gastrointestinal: negative for nausea, vomiting  Integument/breast: negative for rash and skin lesion  Hematologic/lymphatic: negative for easy bruising and bleeding  Musculoskeletal: negative for neck pain  Neurological: negative for numbness and tingling  Allergy:   Allergies   Allergen Reactions    Macrodantin [Nitrofurantoin] Shortness of Breath    Macrobid [Nitrofurantoin Monohyd/M-Cryst]     Morphine  Swelling     Facial swelling    Zofran   [Ondansetron ] Nausea/ Vomiting         Exam:      General: appears in good health  HENT: Head atraumatic. normocephalic, ENT without erythema or injection, mucous membranes moist.  Neck: supple, symmetrical, trachea midline.  Lungs: Non-labored breathing.  Cardiovascular: regular rate and rhythm. Appears well perfused.   Extremities: extremities normal, atraumatic, no cyanosis or edema  Skin: No rashes or lesions  Neurologic: Alert and oriented x3. No gross motor or sensory deficits in bilateral extremities.  Psychiatric: Normal affect, behavior, speech      Labs:  I have reviewed all lab results.            I discussed treatment plan with patient including risks and benefits of procedures and patient consents.       Fairy Homer Degolier, DO   10/23/2023 05:57  Department of Anesthesiology  [facility] Pager [phone]        I saw and examined the patient.  I reviewed the resident's note.  I agree with the findings and plan of care as documented in the resident's note.  Any exceptions/additions are edited/noted.    Pat Bonier, MD

## 2023-10-30 ENCOUNTER — Other Ambulatory Visit: Payer: Self-pay

## 2023-10-30 ENCOUNTER — Encounter (INDEPENDENT_AMBULATORY_CARE_PROVIDER_SITE_OTHER): Payer: Self-pay | Admitting: Podiatrist

## 2023-10-30 ENCOUNTER — Ambulatory Visit (INDEPENDENT_AMBULATORY_CARE_PROVIDER_SITE_OTHER): Payer: Self-pay | Admitting: Podiatrist

## 2023-10-30 ENCOUNTER — Ambulatory Visit (HOSPITAL_BASED_OUTPATIENT_CLINIC_OR_DEPARTMENT_OTHER)
Admission: RE | Admit: 2023-10-30 | Discharge: 2023-10-30 | Disposition: A | Discharge status: Home / Self Care | Source: Ambulatory Visit

## 2023-10-30 ENCOUNTER — Ambulatory Visit: Payer: Self-pay | Attending: Podiatrist | Admitting: Podiatrist

## 2023-10-30 DIAGNOSIS — Z8739 Personal history of other diseases of the musculoskeletal system and connective tissue: Secondary | ICD-10-CM

## 2023-10-30 DIAGNOSIS — M2040 Other hammer toe(s) (acquired), unspecified foot: Secondary | ICD-10-CM | POA: Insufficient documentation

## 2023-10-30 DIAGNOSIS — M79675 Pain in left toe(s): Secondary | ICD-10-CM | POA: Insufficient documentation

## 2023-10-30 DIAGNOSIS — Z9889 Other specified postprocedural states: Secondary | ICD-10-CM

## 2023-10-30 DIAGNOSIS — M205X2 Other deformities of toe(s) (acquired), left foot: Secondary | ICD-10-CM

## 2023-10-30 DIAGNOSIS — G8929 Other chronic pain: Secondary | ICD-10-CM

## 2023-10-30 DIAGNOSIS — E119 Type 2 diabetes mellitus without complications: Secondary | ICD-10-CM | POA: Insufficient documentation

## 2023-10-30 NOTE — Progress Notes (Signed)
 PODIATRY CLINIC  DEPARTMENT OF [facility]    [facility]  [place], [place] [address]      PATIENT NAME:  Courtney [name]  DATE OF BIRTH:  1966/01/24  MRN:  [id number]  DATE OF SERVICE:  10/30/2023     HISTORY OF PRESENT ILLNESS:   58 y.o. Non-Hispanic female presents for Left foot 2nd digit hammertoe abducted and overlapping 3rd digit s/p hammertoe correction with K-wire fixation; DOS 10/23/2023. Rates current pain 0/10. She relates very minimal pain. Pt continues to ambulate using post-op shoe and crutches. She states her dressings came off 2 separate times that she had to re-wrap herself. Hx of s/p right foot exostectomy of prominent sesamoid bones, hallux IPJ fusion with screw fixation, arthroplasty with temporary K-wire fixation digits 2, 3, 4 and 5th digit derotational arthroplasty; DOS 12/06/2021. Past surgical hx includes lesser digit hammertoe repair to toes 2 -4 in the 1990's. 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  She is a former smoker.  She presents to clinic today with her spouse.     REVIEW OF SYSTEMS:   Denies fevers, chills, nausea, vomiting, and shortness of breath.  All other systems reviewed and are negative, except as reported in HPI.      PHYSICAL EXAM:   General:  Appears stated age.  Well-appearing.    Psychiatric:  Pleasant.  Cooperative.  Alert and oriented x 3.    Respiratory:  Lung expansion symmetric.  No active work of breathing.    Heme/Lymphatic:  No lymphadenopathy.    ENMT:  Sclera clear.  Trachea midline.  Mucous membranes moist.    Vascular:  +2/4 dorsalis pedis and posterior tibial pulses, bilaterally.  Capillary fill time was less than 3 seconds, bilaterally.  Skin temperature was warm at the level of the toes, bilaterally.  There was no edema noted.   Intact hair growth noted to bilateral tibia.  Skin was of good turgor and texture.     Neurologic:  Gross sensation intact to touch, bilaterally.  No paresthesias noted, bilateral feet. No numbness noted.  Dermatologic: L foot 2nd digit s/p surgical site stable with sutures and K-wire intact, no gapping or discharge, minimal surrounding edema and erythema without proximal streaking. Right foot multiple surgical incisions well healed.  R hallux nail noted to be absent.  Musculoskeletal:  Left foot hammertoe deformities to lesser toes; partially reducible. +5/5 dorsiflexion, plantarflexion, inversion, eversion, intrinsic muscle strength, bilaterally.  Full range of motion, all quadrants, bilaterally.  No pain with range of motion.  No crepitus noted.         RADIOGRAPHS:  L foot weightbearing XR, 10/30/2023, personally reviewed and interpreted myself:    Surgical K-wire noted at 2nd digit crossing the metatarsophalangeal joint, pin is noted to appear bent at that level      ASSESSMENT:      1. Left foot 2nd digit hammertoe abducted and overlapping 3rd digit s/p hammertoe correction with K-wire fixation; DOS 10/23/2023. Surgical site stable with sutures and K-wire fixation intact. K-wire noted to be bent on XR. No acute signs of infection.   2. Right foot exostectomy of prominent sesamoid bones, hallux IPJ fusion with screw fixation, arthroplasty with temporary K-wire fixation digits 2, 3, 4 and 5th digit derotational arthroplasty; DOS 12/06/2021.  Surgical site is well healed and with no acute signs of infection  3.  Hx of Right  2nd -4th digit hammertoe surgery with recurrence.  4. DM 2 (HgbA1c 5.7 12/29/2018)      PLAN:    - Discussed clinical exam and intraoperative findings and answered all questions.  - L foot 2nd digit K-wire closed reduction in clinic today.   - Surgical site cleaned and redressed in clinic today.  - Discussed continued use of pos-op shoe and crutches with heel touch only to ambulate.  - Continue to keep foot clean, dry, and covered.  - Return to work noted dispensed.  - RTC  11/13/2023 with plan for likely suture removal.      This note may have been partially generated using MModal Fluency Direct system and there may be some incorrect words, spellings, and punctuation that were not noted in checking the note before saving.    I am scribing for, and in the presence of, Alen Amy, DPM, for services provided on 10/30/2023.  [name], SCRIBE      I personally performed the services described in this documentation, as scribed  in my presence, and it is both accurate  and complete.    Alen Amy, DPM       [name]  Podiatry, Assistant Professor  Department of Orthopaedics  [place]  [facility]  Pager [phone]

## 2023-11-03 DIAGNOSIS — M2042 Other hammer toe(s) (acquired), left foot: Secondary | ICD-10-CM

## 2023-11-06 ENCOUNTER — Ambulatory Visit (INDEPENDENT_AMBULATORY_CARE_PROVIDER_SITE_OTHER): Payer: Self-pay | Admitting: Podiatrist

## 2023-11-13 ENCOUNTER — Encounter (INDEPENDENT_AMBULATORY_CARE_PROVIDER_SITE_OTHER): Payer: Self-pay | Admitting: Podiatrist

## 2023-11-13 ENCOUNTER — Other Ambulatory Visit: Payer: Self-pay

## 2023-11-13 ENCOUNTER — Ambulatory Visit (INDEPENDENT_AMBULATORY_CARE_PROVIDER_SITE_OTHER): Payer: Self-pay | Admitting: Podiatrist

## 2023-11-13 ENCOUNTER — Ambulatory Visit (HOSPITAL_BASED_OUTPATIENT_CLINIC_OR_DEPARTMENT_OTHER)
Admission: RE | Admit: 2023-11-13 | Discharge: 2023-11-13 | Disposition: A | Discharge status: Home / Self Care | Source: Ambulatory Visit

## 2023-11-13 ENCOUNTER — Ambulatory Visit: Payer: Self-pay | Attending: Podiatrist | Admitting: Podiatrist

## 2023-11-13 DIAGNOSIS — Z8739 Personal history of other diseases of the musculoskeletal system and connective tissue: Secondary | ICD-10-CM | POA: Insufficient documentation

## 2023-11-13 DIAGNOSIS — M205X2 Other deformities of toe(s) (acquired), left foot: Secondary | ICD-10-CM

## 2023-11-13 DIAGNOSIS — Z9889 Other specified postprocedural states: Secondary | ICD-10-CM | POA: Insufficient documentation

## 2023-11-13 DIAGNOSIS — M2040 Other hammer toe(s) (acquired), unspecified foot: Secondary | ICD-10-CM | POA: Insufficient documentation

## 2023-11-13 DIAGNOSIS — M79675 Pain in left toe(s): Secondary | ICD-10-CM | POA: Insufficient documentation

## 2023-11-13 DIAGNOSIS — E119 Type 2 diabetes mellitus without complications: Secondary | ICD-10-CM

## 2023-11-13 DIAGNOSIS — G8929 Other chronic pain: Secondary | ICD-10-CM | POA: Insufficient documentation

## 2023-11-13 DIAGNOSIS — Z4889 Encounter for other specified surgical aftercare: Secondary | ICD-10-CM

## 2023-11-13 NOTE — Progress Notes (Signed)
 PODIATRY CLINIC  DEPARTMENT OF [facility]  [place]  [facility]    Physician [facility]  [place], [place] [address]      PATIENT NAME:  Courtney [name]  DATE OF BIRTH:  05/31/1966  MRN:  [id number]  DATE OF SERVICE:  11/13/2023     HISTORY OF PRESENT ILLNESS:   58 y.o. Non-Hispanic female presents for Left foot 2nd digit hammertoe abducted and overlapping 3rd digit s/p hammertoe correction with K-wire fixation; DOS 10/23/2023. Rates current pain 0/10. Pt continues to ambulate using post-op shoe and crutches. Hx of s/p right foot exostectomy of prominent sesamoid bones, hallux IPJ fusion with screw fixation, arthroplasty with temporary K-wire fixation digits 2, 3, 4 and 5th digit derotational arthroplasty; DOS 12/06/2021. Past surgical hx includes lesser digit hammertoe repair to toes 2 -4 in the 1990's. 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  She is a former smoker.  She presents to clinic today with her spouse.       REVIEW OF SYSTEMS:   Denies fevers, chills, nausea, vomiting, and shortness of breath.  All other systems reviewed and are negative, except as reported in HPI.      PHYSICAL EXAM:   General:  Appears stated age.  Well-appearing.    Psychiatric:  Pleasant.  Cooperative.  Alert and oriented x 3.    Respiratory:  Lung expansion symmetric.  No active work of breathing.    Heme/Lymphatic:  No lymphadenopathy.    ENMT:  Sclera clear.  Trachea midline.  Mucous membranes moist.    Vascular:  +2/4 dorsalis pedis and posterior tibial pulses, bilaterally.  Capillary fill time was less than 3 seconds, bilaterally.  Skin temperature was warm at the level of the toes, bilaterally.  There was no edema noted.   Intact hair growth noted to bilateral tibia.  Skin was of good turgor and texture.    Neurologic:  Gross sensation intact to touch, bilaterally.  No paresthesias noted, bilateral feet. No numbness  noted.  Dermatologic: L foot 2nd digit s/p surgical site stable with s/p K-wire and sutures removed, no gapping or discharge, no surrounding edema or erythema. Right foot multiple surgical incisions well healed.  R hallux nail noted to be absent.  Musculoskeletal:  Left foot hammertoe deformities to lesser toes; partially reducible. +5/5 dorsiflexion, plantarflexion, inversion, eversion, intrinsic muscle strength, bilaterally.  Full range of motion, all quadrants, bilaterally.  No pain with range of motion.  No crepitus noted.       RADIOGRAPHS:  L foot weightbearing XR, 11/23/2023, personally reviewed and interpreted myself:    S/p removal of K-wire with overall stable appearance of toe      ASSESSMENT:      1. Left foot 2nd digit hammertoe abducted and overlapping 3rd digit s/p hammertoe correction with K-wire fixation; DOS 10/23/2023. Surgical site stable with s/p K-wire fixation and sutures removed. No acute signs of infection.   2. Right foot exostectomy of prominent sesamoid bones, hallux IPJ fusion with screw fixation, arthroplasty with temporary K-wire fixation digits 2, 3, 4 and 5th digit derotational arthroplasty; DOS 12/06/2021.  Surgical site is well healed and with no acute signs of infection  3.  Hx of Right  2nd-4th digit hammertoe surgery with recurrence.  4. DM 2 (HgbA1c 5.7 12/29/2018)      PLAN:    - Discussed clinical exam and intraoperative findings and answered all  questions.  - Surgical site cleaned, sutures and K-wire removed, and site redressed in clinic today.  - Discussed pt is OK to shower in 24 hours with foot uncovered.  - Discussed continued use of post-op shoe with full weightbearing.  - Dispensed DARCO splint as trial.  - RTC 6 weeks for re-evaluation.          I was present for all aspects of the procedure and the Medic completed the procedure with my direct assistance and supervision.    Alen Amy, DPM  Asst Professor - Dept of Orthopaedics  Podiatry  Pager [phone]       This note may  have been partially generated using MModal Fluency Direct system and there may be some incorrect words, spellings, and punctuation that were not noted in checking the note before saving.    I am scribing for, and in the presence of, Alen Amy, DPM, for services provided on 11/13/2023.  [name], SCRIBE      I personally performed the services described in this documentation, as scribed  in my presence, and it is both accurate  and complete.    Alen Amy, DPM         [name]  Podiatry, Assistant Professor  Department of Orthopaedics  [place]  [facility]  Pager [phone]

## 2023-11-17 ENCOUNTER — Encounter (INDEPENDENT_AMBULATORY_CARE_PROVIDER_SITE_OTHER): Payer: Self-pay

## 2023-11-17 DIAGNOSIS — M79675 Pain in left toe(s): Secondary | ICD-10-CM

## 2023-12-02 ENCOUNTER — Encounter (INDEPENDENT_AMBULATORY_CARE_PROVIDER_SITE_OTHER): Payer: Self-pay | Admitting: Podiatrist

## 2023-12-09 ENCOUNTER — Other Ambulatory Visit: Payer: Self-pay

## 2023-12-09 ENCOUNTER — Ambulatory Visit (INDEPENDENT_AMBULATORY_CARE_PROVIDER_SITE_OTHER)

## 2023-12-09 ENCOUNTER — Ambulatory Visit (INDEPENDENT_AMBULATORY_CARE_PROVIDER_SITE_OTHER): Payer: Self-pay | Admitting: Orthopaedic Surgery

## 2023-12-09 ENCOUNTER — Ambulatory Visit: Attending: Physician Assistant | Admitting: Physician Assistant

## 2023-12-09 DIAGNOSIS — M545 Low back pain, unspecified: Secondary | ICD-10-CM | POA: Insufficient documentation

## 2023-12-09 DIAGNOSIS — M542 Cervicalgia: Secondary | ICD-10-CM

## 2023-12-10 DIAGNOSIS — M4312 Spondylolisthesis, cervical region: Secondary | ICD-10-CM

## 2023-12-10 DIAGNOSIS — M47812 Spondylosis without myelopathy or radiculopathy, cervical region: Secondary | ICD-10-CM

## 2023-12-10 DIAGNOSIS — Z981 Arthrodesis status: Secondary | ICD-10-CM

## 2023-12-10 DIAGNOSIS — M4317 Spondylolisthesis, lumbosacral region: Secondary | ICD-10-CM

## 2023-12-11 NOTE — Progress Notes (Signed)
 PATIENT NAME: Courtney [name], Courtney [name] [facility] NUMBER:  [id number]  DATE OF SERVICE: 12/09/2023  DATE OF BIRTH:  Feb 13, 1966    PROGRESS NOTE    SUBJECTIVE:  Courtney [name] is a 58 year old female with past medical history of an L3-S1 posterior spinal instrumentation and fusion with decompression at L3-L4 and L4-L5 with Dr. Roselle Conner May 30, 2017.  She did very well after her surgery with Dr. Roselle Conner.  She reports that around March of this year she started experiencing some left groin and thigh pain.  She does have history of bilateral hip replacements so she reached out to her hip provider, Dr. Britta Candy, as she thought she was getting issues with her hip again.  He evaluated her hip and said that this is not related to her hips.  She continued just to watch her symptoms and manage them conservatively.  She reports that the Tuesday after Memorial Day her pain into her left hip and top of her thigh became very severe to the point where she felt like her leg was giving out on her.  She describes that the leg was painful and was describing tingling that radiated to the top of the thigh, stopped at the knee.  It got severe enough where she was not able to put weight on it, so she eventually went to the Emergency Department.  A CT scan was completed at the [facility].  The patient was advised to follow up with us  on an outpatient basis.  She reports that since the Tuesday after Memorial Day, she was given a steroid Dosepak and has been taking ibuprofen and Tylenol  and it has improved her symptoms somewhat but it has certainly not completely relieved her symptoms.  She reports that her pain is worse with standing and walking, better if she sits down.  She did recall that Dr. Roselle Conner had told her if she ever had increased leg pain to come back to the clinic to see us  and so she wanted to make sure that additional workup was not needed.  She is not having bowel or bladder changes or saddle anesthesia.      Of  note, the patient was also previously being followed with Dr. Roselle Conner for some symptoms of cervical myelopathy.  The last time he checked her for that was back in April 2023.  At that point, she was not describing any significant progression of her symptoms.  Today, she continues to report tingling to all her fingertips.  She does report that she struggles she with buttoning buttons and picking up coins.  She feels like this may be getting a little worse since the last time she saw Dr. Roselle Conner.  She does report that her balance has been off, but she is not sure if this has to do with the fact that she is having increased pain to her leg.  She is not using any assistive devices to ambulate but she certainly feels like her balance might be getting a little worse.  She thinks that she had a cervical MRI within the last year.    OBJECTIVE:  Courtney [name] is a 58 year old female who presents to clinic today in no acute distress.  Her reflexes in the upper extremity are 1+ bilaterally.  I could not appreciate a Hoffmann's today.  She is a little unsteady when she walks.  It is more of an antalgic gait, not spastic or wide based.  She does have decreased sensation to her fingertips bilaterally, worse  on the right though compared to the left.  Painless rotation of her hips bilaterally.  She is a little guarded with hip flexion on the left.  All other strength though is good.  I did not have her complete single legged step-ups today, but she does report that this is challenging for her.    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  Hardware is intact from L4 to S1 with a bone graft at L4-L5, notable bilateral hip replacements in good alignment.  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  She does kyphose on flexion at C4-C5.  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  No recent lumbar  MRI.    ASSESSMENT AND PLAN:  Courtney [name] is a 58 year old female who comes to clinic today for evaluation of increased left leg pain.  She is describing pain radiating into the groin into the top of the thigh.  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  Would advise getting a lumbar MRI.  I can order this.  She can do this closer to home and then we can see her after.  She has been doing some physician-directed home exercises.  She has not been in formal therapy, but she does know the back exercises to do as she has completed these over the years.  She has tried medications including a steroid Dosepak, ibuprofen and Tylenol .  We talked about trying some gabapentin .  She has tried this in the past and it caused a lot of brain fog, so she is not interested in doing this today.  Given the fact that she is describing some worsening symptoms of cervical myelopathy, I would like to order a cervical MRI as well, but she reports she thinks she had this done about a year ago.  She said she thinks it was an external facility.  I informed her that I would like her to check the date on that CD.  If it has not been within the last year, she can call me and I can order a cervical MRI as well.  Once she gets the lumbar MRI, I will see her back in clinic with Dr. Guinea-Bissau on a Tuesday.  The patient was agreeable to this plan and did not have any other questions or concerns.  If she does develop any questions in the meantime, she can certainly call the office.    The patient was seen independently.    Monroe Antigua, PA-C                DD:  12/10/2023 08:13:34  DT:  12/11/2023 11:19:22 LL  D#:  [id number]

## 2023-12-14 ENCOUNTER — Ambulatory Visit (INDEPENDENT_AMBULATORY_CARE_PROVIDER_SITE_OTHER): Admitting: Orthopaedic Surgery

## 2023-12-22 ENCOUNTER — Encounter (INDEPENDENT_AMBULATORY_CARE_PROVIDER_SITE_OTHER): Payer: Self-pay | Admitting: Physician Assistant

## 2023-12-24 ENCOUNTER — Ambulatory Visit (INDEPENDENT_AMBULATORY_CARE_PROVIDER_SITE_OTHER): Payer: Self-pay | Admitting: Podiatrist

## 2024-01-07 ENCOUNTER — Other Ambulatory Visit (HOSPITAL_COMMUNITY): Payer: Self-pay

## 2024-01-08 ENCOUNTER — Telehealth (INDEPENDENT_AMBULATORY_CARE_PROVIDER_SITE_OTHER): Payer: Self-pay | Admitting: Podiatrist

## 2024-01-08 NOTE — Nursing Note (Signed)
 Message from [place] B sent at 01/08/2024 12:11 PM EDT    Summary: Reschedule    Copied From CRM [id number].  Castelluccio, Courtney [name] (Self) called to reschedule an appointment.     [place] pt    Pt is requesting to reschedule her POV to 7/29 late morning early afternoon as she has another appt at 2:15 pm that day  Please advise  Thank you!                Call History    Contact Date/Time Type Contact Phone/Fax By   01/08/2024 12:08 PM EDT Phone (Incoming) [name], Courtney Calles (Self) [phone] (M) Harvel, Duwaine Norris     Reschedule  Received: Today  Bolyard, Duwaine Norris [facility] Podiatry Dr Constantine Service    Left msg 07/11@1457  Berwyn Fortis back . Aware Dr. Constantine not in the office on 07/29. She said she will call back when she knows she will be in the area again. Thanked me for the call.     Berwyn Locust, RN

## 2024-01-25 ENCOUNTER — Encounter (INDEPENDENT_AMBULATORY_CARE_PROVIDER_SITE_OTHER): Payer: Self-pay | Admitting: Physician Assistant

## 2024-01-26 ENCOUNTER — Ambulatory Visit (INDEPENDENT_AMBULATORY_CARE_PROVIDER_SITE_OTHER): Payer: Self-pay | Admitting: Orthopaedic Surgery of the Spine

## 2024-02-02 ENCOUNTER — Other Ambulatory Visit (HOSPITAL_COMMUNITY): Payer: Self-pay

## 2024-02-05 ENCOUNTER — Other Ambulatory Visit (HOSPITAL_COMMUNITY): Payer: Self-pay

## 2024-02-05 DIAGNOSIS — R519 Headache, unspecified: Secondary | ICD-10-CM

## 2024-02-09 ENCOUNTER — Other Ambulatory Visit: Payer: Self-pay

## 2024-02-09 ENCOUNTER — Ambulatory Visit: Attending: Orthopaedic Surgery of the Spine | Admitting: Orthopaedic Surgery of the Spine

## 2024-02-09 DIAGNOSIS — R519 Headache, unspecified: Secondary | ICD-10-CM | POA: Insufficient documentation

## 2024-02-09 DIAGNOSIS — R2689 Other abnormalities of gait and mobility: Secondary | ICD-10-CM | POA: Insufficient documentation

## 2024-02-09 DIAGNOSIS — H538 Other visual disturbances: Secondary | ICD-10-CM | POA: Insufficient documentation

## 2024-02-09 DIAGNOSIS — M5417 Radiculopathy, lumbosacral region: Secondary | ICD-10-CM | POA: Insufficient documentation

## 2024-02-09 DIAGNOSIS — G930 Cerebral cysts: Secondary | ICD-10-CM | POA: Insufficient documentation

## 2024-02-09 DIAGNOSIS — M7138 Other bursal cyst, other site: Secondary | ICD-10-CM | POA: Insufficient documentation

## 2024-02-09 DIAGNOSIS — R29898 Other symptoms and signs involving the musculoskeletal system: Secondary | ICD-10-CM | POA: Insufficient documentation

## 2024-02-09 DIAGNOSIS — M4802 Spinal stenosis, cervical region: Secondary | ICD-10-CM | POA: Insufficient documentation

## 2024-02-09 NOTE — Addendum Note (Signed)
 Addended by: JAMEY STEVPHEN DAIS on: 02/09/2024 06:08 PM     Modules accepted: Orders

## 2024-02-09 NOTE — H&P (Signed)
 ORTHOPAEDIC SPINE CENTER    PATIENT NAME: Courtney [name], Courtney [name] [facility] NUMBER:  [id number]  DATE OF SERVICE: 02/09/2024  DATE OF BIRTH:  23-Nov-1965    HISTORY AND PHYSICAL for NPV (NEW PATIENT VISIT)     CHIEF COMPLAINT:  1. Concern for myelopathy.  2. L2 and L3 lumbar radiculopathy.    HISTORY OF PRESENT ILLNESS:  Courtney [name] is a pleasant 59 year old female who presents today with a past medical history of L3 to L1 posterior spinal instrumentation and fusion with decompression at L3-L4 and L4-L5 by Dr. Shona on May 30, 2017.  Originally, she did very well after the surgery, but about March of this year she started having pain in the anterior thigh and down into her groin.  She previously reached to her arthroplasty provider, Dr. Joesph, who thought she was having issues with her hip replacements, which she has undergone bilateral hip replacements, or possibly due to her knee which she has gone undergone a right knee replacement, but she is having left groin and left thigh pain.  She continued to have these symptoms despite her evaluation of her hips being not problematic.  Additionally, she describes the pain as numbness and tingling as well as pain on the anterior left thigh and into her left groin.  She says that they come from her low back and seem to radiate there and become so severe that sometimes she has to stop walking or has difficulty when she is sitting in the car for a while.  She has to lay down flat for this to be relieved.  She denies any numbness or tingling in her perianal or perineal distribution.  She denies any numbness or tingling in her right lower extremity and denies any weakness due to this.  She has no problems with going up the stairs and she is able to take them one at a time and denies any pain with extension of her spine.  That is her first concern.    The second concern that she is concerned about.  She is being followed by Dr. Shona due to some cervical stenosis that she  has and concern for progression of cervical myelopathy.  She has had a couple checks, the last check was back in April 2023.  At that point, she was not describing any symptoms of progression.  Today, she complains of some tingling in her fingertips.  She reports that she does have some difficulty with fine motor tasks including buttoning buttons and picking up coins.  She feels that she can no longer put earrings through her ears and this has gotten worse since she was last seen in the clinic in June.  She additionally says that she has been having some balance issues.  She feels like she is walking drunk and feels that she even after walking a couple steps has hip issues.  Additionally, she complains of some vision problems, which she underwent a brain MRI that showed a cyst.  She is currently trying to find a neurologist and she has some concurrent headaches.  She denies any urinary symptoms, but was worried that she was having progression of his symptoms that she was warned about by Dr. Shona.    The patient reports that her vision problems came on earlier in the past year or so and headaches started getting worse in the last 2 years about the time she started having her dexterity problems as well as her balance problems.    PAST MEDICAL  HISTORY:  Hypertension, sleep apnea, hypothyroidism, and type 2 diabetes.    PAST SURGICAL HISTORY:  She had her PSF L3 TO S1 with decompression at L3-L4, L4-L5 by Dr. Shona on May 30, 2017.    Additionally, she had bilateral carpal tunnels by Dr. Graciela  in July 2020  Had multiple foot surgeries by Dr. Chyrl in June 2023   Right knee replacement by Dr. Joesph as well as previous bilateral hip replacements by Dr. Joesph of unknown dates.    MEDICATIONS:  1. Tylenol .  2. Amitriptyline .  3. Aspirin  81 mg.  4. Bupropion .  5. Zyrtec.  6. Singulair.  7. Levothyroxine .  8. Lidocaine  patches.  9. Losartan .  10. Mounjaro.  11. Percocet.  12. Protonix .  13. Imitrex .    PHYSICAL  EXAMINATION:  Courtney [name] is in no acute distress.  Husband is at bedside.  Reflexes bilateral upper extremities are 1+, bilateral lower extremities are 1+.  Could not appreciate Hoffman's.  Has no clonus.      She is unsteady with her gait.  She is not antalgic and does not have a wide-based gait.  She is unable to do heel-to-toe with good balance.    She does have decreased sensation in her bilateral fingertips, worse on the left.  She is monotone with her distribution.      She has normal sensation of her bilateral lower extremities in the L2 through S1 nerve distribution.  She has 5 out of 5 strength in all of upper and lower extremities.  She is able to complete single leg step-ups today and that is not challenging for her.    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  She has good alignment of her bilateral hip replacements.  Her cervical MRI does show some multilevel spondylosis.  She does have a little bit of kyphosis flexion at C5-C4.  Cervical MRIs were reviewed today which do demonstrate some multilevel stenosis with no cord change.  Cervical flex ex does not show any instability.  Lumbar flex ex does not show any instability.    ASSESSMENT:  There are 2 problems that were discussed and I will put them in 2 paragraphs.    First problem:  [name] is a 58 year old female who has L2 and L3 radicular pain.  This is likely secondary to a cyst.  We discussed that this is not a dangerous problem, but it does cause her some pain.  We will send her for a CT-guided L2-L3 transforaminal epidural steroid injection with hopes of getting the L2 exiting root and the L3 transversing root.  We did describe that of all the problems this is the least of her problems, although it is something that is very painful and we believe that the injection will help with that.  We did  want her to follow up 2 weeks after injection and that if this is the source of her pain, it should give relief immediately after the injection is given.  If she does not get any relief from her injection or she only gets partial relief for a short amount of time, we would discuss possible surgical options at a later time, whether that would be extending her fusion.  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  The second problem that we are dealing is convoluted by her brain CT and some of her other symptoms.  She does have some symptoms concerning for myelopathy including worsening of fine motor tasks and dexterity and difficulty with balance.  This is convoluted because she also additionally has had over the last 2 years, worsening headaches in the last year or so some vision problems.  This would not be secondary to her spine and we would want her to be evaluated, especially with an abnormality found on her brain MRI.  We want her to be evaluated by Neurology to rule out neurological conditions that could be causing the balance and dexterity issues in addition to the vision problems.  We want this ruled out before we discussed the surgical intervention so we will refer the patient to Neurology  they can evaluate her  for possible multiple sclerosis.  The other symptoms may be aligned with that.  There are not any symptoms that overlap with cervical myelopathy and multiple sclerosis should be ruled out before we have a discussion of surgical intervention.  If we were to do a surgical intervention in the future, we would want to get a CT of her cervical spine as we would likely do a 1-level ACDF at C5-C6.    PLAN:  For her radiculopathy pain, we are going to send her for an injection and have her followup 2 weeks after injection.    For her possible myelopathy symptoms, we want her to get into the Neurology Clinic to be evaluated for possible MS and have her  follow up in clinic after that to discuss options if the neurologist does not believe this is MS, then we will proceed with planning a myelopathy surgery including a possible 1-level ACDF at C5-C6.    All the patient's questions were answered.  The patient expressed verbal understanding of the plan.        Kaley F. Jamey, MD    Braydin Aloi Guinea-Bissau, MD  Chief, Section of Spinal Surgery  [place] Department of Orthopaedics              DD:  02/09/2024 18:07:09  DT:  02/09/2024 19:06:05 FP  D#:  [id number]  I personally saw and examined this patient, reviewed their studies, the resident , fellow or PA note, and dicussed the findings and treatment options with the patient and resident, fellow, or PA.  The date of the signature is simply the date the note was electronically signed , the patient was seen on the date of service indicated in the note.  Aubry Tucholski Guinea-Bissau, MD 02/09/2024, 19:19        ICD-10-CM    1. Lumbosacral radiculopathy at L2  M54.17       2. Lumbosacral radiculopathy at L3  M54.17       3. Cervical stenosis of spinal canal  M48.02       4. Blurry vision  H53.8 Refer to [facility] Neurology     CANCELED: Refer to [facility] Neurology      5. 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 Neurology     CANCELED: Refer to [facility] Neurology      7. Frequent headaches  R51.9 Refer to [facility] Neurology     CANCELED: Refer to [facility] Neurology      8. Impaired dexterity  R29.898 Refer to [facility] Neurology     CANCELED: Refer to [facility] Neurology  9. Balance problems  R26.89 Refer to [facility] Neurology     CANCELED: Refer to [facility] Neurology

## 2024-02-10 ENCOUNTER — Ambulatory Visit (INDEPENDENT_AMBULATORY_CARE_PROVIDER_SITE_OTHER): Payer: Self-pay

## 2024-02-10 NOTE — Telephone Encounter (Signed)
 Referral received from Dr. Guinea-Bissau requesting multiple sclerosis evaluation by a specialist. RN contacted referring office to requests updated imaging be ordered.  Multiple sclerosis specialist would like to have the imaging at the first appt. Message was left.

## 2024-02-14 ENCOUNTER — Other Ambulatory Visit (INDEPENDENT_AMBULATORY_CARE_PROVIDER_SITE_OTHER): Payer: Self-pay | Admitting: Physician Assistant

## 2024-02-14 DIAGNOSIS — G930 Cerebral cysts: Secondary | ICD-10-CM

## 2024-02-14 DIAGNOSIS — H538 Other visual disturbances: Secondary | ICD-10-CM

## 2024-03-01 ENCOUNTER — Other Ambulatory Visit: Payer: Self-pay

## 2024-03-01 ENCOUNTER — Ambulatory Visit (INDEPENDENT_AMBULATORY_CARE_PROVIDER_SITE_OTHER): Payer: Self-pay

## 2024-03-01 DIAGNOSIS — M542 Cervicalgia: Secondary | ICD-10-CM

## 2024-03-01 DIAGNOSIS — M25512 Pain in left shoulder: Secondary | ICD-10-CM

## 2024-03-01 DIAGNOSIS — M25511 Pain in right shoulder: Secondary | ICD-10-CM

## 2024-03-01 DIAGNOSIS — M546 Pain in thoracic spine: Secondary | ICD-10-CM

## 2024-03-01 NOTE — Progress Notes (Signed)
 [facility]  [address] Courtney [name]  [place] [address]      Name: Courtney [name] MRN:  [id number]   Date: 03/01/2024 DOB:  11-Jan-1966 (58 y.o.)      Subjective:   Neck Pain, Shoulder Pain, and Back Pain (upper)       Objective :  Tension and tightness in SCM's, occipitals, deltoids, traps    Assessment:  Light Deep Tissue and Stretching therapies performed today  Pt is extremely tense, tight and tender through neck, deltoids and traps. Will need several appointments to work through and see improvements  Tension and tightness was slightly reduced in SCM's, scalenes, occipitals, sub-occipitals, levators, deltoids, scapulas,traps and rhomboids  Pt was seen for a 60 minute session    Plan:  Drink plenty of water . Biofreeze, ice and/or warm bath in epsom salt as needed for soreness.   Stretch multiple times per day, focusing on neck, shoulders, traps    [employer], LMT

## 2024-03-03 ENCOUNTER — Ambulatory Visit
Admission: RE | Admit: 2024-03-03 | Discharge: 2024-03-03 | Disposition: A | Discharge status: Home / Self Care | Payer: Self-pay | Source: Ambulatory Visit | Attending: Nuclear Radiology | Admitting: Nuclear Radiology

## 2024-03-03 ENCOUNTER — Other Ambulatory Visit: Payer: Self-pay

## 2024-03-03 ENCOUNTER — Ambulatory Visit (INDEPENDENT_AMBULATORY_CARE_PROVIDER_SITE_OTHER): Admitting: Rheumatology

## 2024-03-03 ENCOUNTER — Ambulatory Visit (HOSPITAL_COMMUNITY): Admitting: Nuclear Radiology

## 2024-03-03 ENCOUNTER — Encounter (HOSPITAL_COMMUNITY)
Admission: RE | Disposition: A | Discharge status: Home / Self Care | Payer: Self-pay | Source: Ambulatory Visit | Attending: Nuclear Radiology

## 2024-03-03 ENCOUNTER — Ambulatory Visit: Payer: Self-pay | Admitting: Neurology

## 2024-03-03 ENCOUNTER — Encounter (INDEPENDENT_AMBULATORY_CARE_PROVIDER_SITE_OTHER): Payer: Self-pay | Admitting: Neurology

## 2024-03-03 DIAGNOSIS — R519 Headache, unspecified: Secondary | ICD-10-CM | POA: Insufficient documentation

## 2024-03-03 DIAGNOSIS — R29898 Other symptoms and signs involving the musculoskeletal system: Secondary | ICD-10-CM | POA: Insufficient documentation

## 2024-03-03 DIAGNOSIS — R2689 Other abnormalities of gait and mobility: Secondary | ICD-10-CM | POA: Insufficient documentation

## 2024-03-03 DIAGNOSIS — H538 Other visual disturbances: Secondary | ICD-10-CM | POA: Insufficient documentation

## 2024-03-03 DIAGNOSIS — G930 Cerebral cysts: Secondary | ICD-10-CM | POA: Insufficient documentation

## 2024-03-03 DIAGNOSIS — M5417 Radiculopathy, lumbosacral region: Secondary | ICD-10-CM | POA: Insufficient documentation

## 2024-03-03 LAB — PROTEIN FOR ELECTROPHORESIS: PROTEIN TOTAL: 6.4 g/dL (ref 6.0–7.9)

## 2024-03-03 LAB — ALBUMIN FOR ELECTROPHORESIS: ALBUMIN: 4 g/dL (ref 3.5–5.0)

## 2024-03-03 LAB — HGA1C (HEMOGLOBIN A1C WITH EST AVG GLUCOSE)
ESTIMATED AVERAGE GLUCOSE: 103 mg/dL
HEMOGLOBIN A1C: 5.2 % (ref 4.0–5.6)

## 2024-03-03 LAB — FOLATE: FOLATE: 11.1 ng/mL (ref 7.0–31.0)

## 2024-03-03 LAB — THYROID STIMULATING HORMONE WITH FREE T4 REFLEX: TSH: 0.666 u[IU]/mL (ref 0.350–4.940)

## 2024-03-03 LAB — VITAMIN B12: VITAMIN B 12: 1224 pg/mL — ABNORMAL HIGH (ref 200–900)

## 2024-03-03 SURGERY — IR EPIDURAL STEROID INJECTION LUMBAR

## 2024-03-03 MED ORDER — BUPIVACAINE (PF) 0.25 % (2.5 MG/ML) INJECTION SOLUTION
3.0000 mL | Freq: Once | INTRAMUSCULAR | Status: AC
Start: 2024-03-03 — End: 2024-03-03
  Administered 2024-03-03: 3 mL via EPIDURAL

## 2024-03-03 MED ORDER — BUPIVACAINE (PF) 0.25 % (2.5 MG/ML) INJECTION SOLUTION
INTRAMUSCULAR | Status: AC
Start: 2024-03-03 — End: 2024-03-03
  Filled 2024-03-03: qty 30

## 2024-03-03 MED ORDER — IOPAMIDOL 200 MG IODINE/ML (41 %) INTRATHECAL SOLUTION
6.0000 mL | INTRATHECAL | Status: AC
Start: 2024-03-03 — End: 2024-03-03
  Administered 2024-03-03: 6 mL via EPIDURAL

## 2024-03-03 MED ORDER — BUPIVACAINE (PF) 0.25 % (2.5 MG/ML) INJECTION SOLUTION
5.0000 mL | Freq: Once | INTRAMUSCULAR | Status: AC
Start: 2024-03-03 — End: 2024-03-03
  Administered 2024-03-03: 5 mL

## 2024-03-03 MED ORDER — DEXAMETHASONE SODIUM PHOSPHATE (PF) 10 MG/ML INJECTION SOLUTION
12.0000 mg | Freq: Once | INTRAMUSCULAR | Status: AC
Start: 2024-03-03 — End: 2024-03-03
  Administered 2024-03-03: 12 mg via EPIDURAL

## 2024-03-03 MED ORDER — DEXAMETHASONE SODIUM PHOSPHATE (PF) 10 MG/ML INJECTION SOLUTION
INTRAMUSCULAR | Status: AC
Start: 2024-03-03 — End: 2024-03-03
  Filled 2024-03-03: qty 2

## 2024-03-03 MED ORDER — BACLOFEN 10 MG TABLET
10.0000 mg | ORAL_TABLET | Freq: Two times a day (BID) | ORAL | 3 refills | Status: AC
Start: 2024-03-03 — End: ?

## 2024-03-03 SURGICAL SUPPLY — 2 items
CUSTOM ESI NRB PACK ~~LOC~~ - RUBY MEMORIAL HOSPITAL (CUSTOM TRAYS & PACK) ×1 IMPLANT
NEEDLE EPIDRL YW 6IN 20GA TUOHY METAL PLASTIC BVL STY REM WNG SLIDE DEPTH INDICATOR STRL LF  DISP (MED SURG SUPPLIES) ×1 IMPLANT

## 2024-03-03 NOTE — Ancillary Notes (Signed)
 After Visit Summary - Discharge Instructions  Minimally Invasive Nerve Root Block (NRB)/Epidural Steroid Injection (ESI)/Arthrogram        You may eat your normal diet.  You may shower. Do not soak the injection site for 24 hours (no bathing, hot tub, swimming pools).   A responsible adult must drive you home- you must not drive yourself. Do not participate in strenuous activity or drive for the remainder of the day.  You should limit your activity and not drive for 24 hours  You may experience temporary numbness or weakness in your legs after the injection. When the numbing medication wears off, this should go away.  If you are taking a blood thinner, you may restart it in 24 hours or the following day as directed at the time of the procedure.   If the site of the injection is painful, you may apply an ice pack for up to 20 min at a time to the area to reduce the discomfort or take pain medication as directed by your physician.   FOR DIABETIC PATIENTS: It is important for you to know that if you have diabetes, the steroid can cause elevation in your blood sugar level for up to 2 weeks after the injection. If you have diabetes and regularly check your own blood sugar, you should check your blood sugar more often during the first several days after an epidural steroid injection. Please talk with the doctor who helps to manage your diabetes for instructions on how to change your diet and/or diabetes medication if your blood sugar is elevated.  When should I call for help? If you develop a fever (>100.4 F), chills, increasing pain, redness or swelling at the injection site, weakness or sensory changes or, rarely, changes in bladder and/or bowel function.      If you have any questions, please call the following:  Monday - Friday 8am - 430pm: [phone]. Select option 1 or 2.  Evenings and weekends: [phone]- ask the concierge to page the Radiology Resident on-call.  In the event of a life-threatening  emergency, please call 911.

## 2024-03-03 NOTE — Progress Notes (Signed)
 NEUROLOGY, [facility] OFFICE C[facility]  [address]  [place] [address]  Operated by [facility]     Name: Courtney [name] MRN:  [id number]   DOB: [birth year] Age: 58 y.o.     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     Patient reports that prior to lumbar surgery in 1985, she was having severe radiating pain from lower back down to foot on right side. Experienced significant relief for decades thereafter. In 2018, patient underwent another fusion surgery in lower back. Patient underwent left hip replacement (2015) and right hip replacement (2022) due to arthritic changes.     Starting in March 2025, she started having intermittent right thigh numbness and pain. States she does not wear tight clothes or belts. Pain progressively worsened, exacerbated with activity. Endorses urinary urgency for past several years. Denies any saddle anesthesia.     MRI brain 01/18/24 obtained due to worsening headache and blurry vision showed nonspecific white matter changes, some generalized atrophy, and a left anterior horn subependymal cyst. Of note, describes occipital region headache associated with neck pain and stiffness. Patient reports more recent MRI cervical showed multilevel degenerative changes along with several bulging discs and concern for myelopathy. Previous MRI cervical spine 12/05/17 showed mild to moderate degenerative changes noted to the cervical spine, most pronounced at the C5-C6 level with additional reversal of normal cervical. Lordosis resulting in ventral contouring of the cervical spinal cord.    Underwent EMG nerve conduction study on August 23, 2018, at [facility] which was abnormal. The findings were consistent  with severe right carpal tunnel syndrome without electrophysiological evidence of active axonal injury to the right APB, mild-to-moderate left carpal tunnel syndrome, mixed dependent sensory motor neuropathy.      Family history of RA, strokes in mother and paternal gm and MI and several cancer types including HL and SCC in father.      BP (!) 132/58   Pulse 76   Ht 1.626 m (5' 4)   Wt 80.9 kg (178 lb 5.6 oz)   BMI 30.61 kg/m           Past Medical History:   Diagnosis Date    Anemia 12/29/2018    iron Deficiency     Aneurysm (CMS HCC)     spleen    Anxiety     Arthritis     Asthma 12/29/2018    childhood, no inhalers since    Back problem     Constipation     CPAP (continuous positive airway pressure) dependence     Disorder of liver     Fatty Liver    Eczema 12/29/2018    hands x 1 year- eczema- seen derm    Esophageal reflux 12/29/2018    controlled    Fibromyalgia     Headache     Heart murmur 12/29/2018    Reports she was told this in her 66s    Hx of transfusion 12/29/2018    denies rx    Hyperlipidemia     Hypertension     Hypothyroid     Migraine  Nausea with vomiting     Neck problem     Peripheral edema 12/29/2018    chronic left lower extremity swelling     Peripheral neuropathy     Peripheral vascular disease (CMS HCC)     left lower extremity     PONV (postoperative nausea and vomiting)     Pre-diabetes     Problems with swallowing     Shortness of breath 12/29/2018    DOE- several years    Sleep apnea 12/29/2018    doesn't wear CPAP     Thyroid  disease     Type 2 diabetes mellitus     Diagnosed 06/2021, Hgba1c- ?    Wears glasses        Past Surgical History:   Procedure Laterality Date    ANKLE SURGERY Right     BLADDER SURGERY      CESAREAN SECTION      x 3    COLONOSCOPY      HX BACK SURGERY      lumbar fusion x2, rods, screws    HX CARPAL TUNNEL RELEASE Bilateral 2020    HX FOOT SURGERY      HX HIP REPLACEMENT Bilateral     Right infected and revised    HX HYSTERECTOMY      HX UPPER  ENDOSCOPY      LAMINECTOMY      fusion, x2    LEG SURGERY Left     x 3 , skin graft - from dog bite       Current Outpatient Medications   Medication Sig    acetaminophen  (TYLENOL  ARTHRITIS PAIN) 650 mg Oral Tablet Sustained Release Take 1 Tablet (650 mg total) by mouth Every 8 hours as needed for Pain    amitriptyline  (ELAVIL ) 25 mg Oral Tablet Take 1 Tablet (25 mg total) by mouth Every night    aspirin  81 mg Oral Tablet, Chewable Chew 1 Tablet (81 mg total) Daily    baclofen  (LIORESAL ) 10 mg Oral Tablet Take 1 Tablet (10 mg total) by mouth Twice daily    buPROPion  (WELLBUTRIN  XL) 150 mg extended release 24 hr tablet Take 1 Tablet (150 mg total) by mouth Daily    cetirizine (ZYRTEC) 10 mg Oral Tablet Take 1 Tablet (10 mg total) by mouth Daily (Patient not taking: Reported on 03/03/2024)    ergocalciferol, vitamin D2, (DRISDOL) 1,250 mcg (50,000 unit) Oral Capsule Take 1 Capsule (50,000 Units total) by mouth Every 7 days    fluocinolone  (SYNALAR) 0.025 % Cream Apply 1 Application topically Once per day as needed    Levothyroxine  75 mcg Oral Capsule Take 1 Capsule (75 mcg total) by mouth Daily    losartan  (COZAAR ) 100 mg Oral Tablet Take 1 Tablet (100 mg total) by mouth Daily    MOUNJARO 7.5 mg/0.5 mL Subcutaneous Pen Injector Inject 0.5 mL (7.5 mg total) under the skin Every 7 days    oxyCODONE -acetaminophen  (PERCOCET) 5-325 mg Oral Tablet Take 1 Tablet by mouth Every 6 hours as needed for Pain (Patient not taking: Reported on 03/05/2023)    pantoprazole  (PROTONIX ) 40 mg Oral Tablet, Delayed Release (E.C.) Take 1 Tablet (40 mg total) by mouth Daily    sumatriptan  succinate (IMITREX ) 100 mg Oral Tablet May repeat in 2 hours in needed (Patient taking differently: Take 25 mg by mouth Once, as needed May repeat in 2 hours in needed)       Social History     Socioeconomic History  Marital status: Married   Tobacco Use    Smoking status: Former     Current packs/day: 0.00     Average packs/day: 1 pack/day for 25.0 years  (25.0 ttl pk-yrs)     Types: Cigarettes     Start date: 08/07/1995     Quit date: 08/06/2020     Years since quitting: 3.5    Smokeless tobacco: Never    Tobacco comments:     States a few cigarettes over the last few weeks    Vaping Use    Vaping status: Never Used   Substance and Sexual Activity    Alcohol  use: Not Currently    Drug use: Not Currently   Other Topics Concern    Ability to Walk 1 Flight of Steps without SOB/CP No     Comment: SOB, no CP    Total Care No    Ability To Do Own ADL's Yes    Uses Walker No    Other Activity Level Yes     Comment: Light work. limited acitvity - reports she is sedentary    Uses Cane No     Social Determinants of Health     Social Connections: Low Risk  (10/08/2022)    Social Connections     SDOH Social Isolation: 5 or more times a week     Family Medical History:    None       Allergies[1]    ROS  Negative except as per the HPI.    Exam:  General: Appears stated age, no distress  HENT: Head atraumatic and normocephalic  Ophthalmoscopic (non-dilated): No hemorrhages or papilledema appreciated  MSK: Paraspinal musculature in cervical region tense and tender to palpation  Extremities: No cyanosis or edema  Mental status:  Level of Consciousness: Alert  Orientations: Alert and oriented x 3  Memory: Registration, Recall, and Following of commands is normal  Attentions: Attention and Concentration are normal  Knowledge: Good  Language: Normal  Speech: Normal  Cranial nerves: 2-12 normal   Gait: Antalgic gait  Coordination: Coordination is normal without tremor  Muscle tone: WNL  Muscle exam: 5/5 throughout, although some limitation due to pain  Reflexes: 2+ BUE, 1+ AJ/KJ  Sensory: Sensory exam in the upper and lower extremities is grossly normal, except decreased sensation to vibration and proprioception in distal BLE      Personal review of images, tracings, specimens, outside records:   Independent Interpretation:    MRI brain 01/18/24 showed nonspecific white matter changes, some  generalized atrophy, and a left anterior horn subependymal cyst.     MRI cervical spine 12/05/17 showed mild to moderate degenerative changes noted to the cervical spine, most pronounced at the C5-C6 level with additional reversal of normal cervical. Lordosis resulting in ventral contouring of the cervical spinal cord.      Assessment & Plan:     ICD-10-CM    1. Blurry vision  H53.8       2. Brain cyst  G93.0       3. Frequent headaches  R51.9       4. Impaired dexterity  R29.898       5. Balance problems  R26.89 HGA1C (HEMOGLOBIN A1C WITH EST AVG GLUCOSE)     THYROID  STIMULATING HORMONE WITH FREE T4 REFLEX     VITAMIN B1 (THIAMIN), WHOLE BLOOD     FOLATE     VITAMIN B12     METHYLMALONIC ACID (MMA), QUANTITATIVE, PLASMA     ZINC ,  SERUM     COPPER , SERUM     MONOCLONAL GAMMOPATHY PROFILE WITH SPEP, FLC, AND IMMUNOTYPING REFLEX        Disposition: Return in about 3 months (around 06/02/2024) for sofia nomani, In Person Visit.    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 Patient with extensive past surgeries related to back, hips, and LE's as well as longstanding history of chronic pain and paresthesias in limbs. Moreover, MRI brain 01/18/24 obtained due to worsening headache and blurry vision showed nonspecific white matter changes, some generalized atrophy, and a left anterior horn subependymal cyst. Describes occipital headache with associated neck pain and stiffness. Neuro exam most notable for hyporeflexia in bilateral KJ and decreased sensation to vibration and proprioception in distal BLE. Paraspinal musculature in cervical region tense and tender to palpation. 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 Patient agreeable to trial muscle relaxer for this. Next, patient with decreased sensation in distal LE's, will  order neuropathy labs to assess for possible reversible causes unrelated to known spinal history. In terms of the nonspecific white matter changes seen on recent neuroimaging, expected with age, cardiovascular risk factors, and smoking history. Goal would be to manage modifiable underlying risk factors. As for the subependymal cyst, more than likely incidental and benign. Per literature, no intervention or follow up surveillance necessary.    - Baclofen  10 mg BID PRN for neck pain/headache control  - Neuropathy labs: A1c, TSH, thiamine, folate, B12, MMA, zinc , copper , SPEP   - RTC 3 months with Flint Jarvis Tita Andrez, MD  03/04/24    [facility]        [1]   Allergies  Allergen Reactions    Macrodantin [Nitrofurantoin] Shortness of Breath    Macrobid [Nitrofurantoin Monohyd/M-Cryst]     Morphine  Swelling     Facial swelling    Zofran  [Ondansetron ] Nausea/ Vomiting

## 2024-03-04 ENCOUNTER — Telehealth (INDEPENDENT_AMBULATORY_CARE_PROVIDER_SITE_OTHER): Payer: Self-pay | Admitting: Orthopaedic Surgery of the Spine

## 2024-03-04 LAB — KAPPA AND LAMBDA FREE LIGHT CHAINS, SERUM
KAPPA FREE LIGHT CHAINS: 1.3 mg/dL (ref 1.25–3.25)
KAPPA/LAMBDA FLC RATIO: 1.37 (ref 0.80–2.10)
LAMBDA FREE LIGHT CHAINS: 0.95 mg/dL (ref 0.60–2.70)

## 2024-03-04 LAB — MONOCLONAL GAMMOPATHY PROFILE WITH IMMUNOTYPING REFLEX
ALBUMIN: 4 g/dL
KAPPA FREE LIGHT CHAINS: 1.3 mg/dL (ref 1.25–3.25)
KAPPA/LAMBDA FLC RATIO: 1.37 (ref 0.80–2.10)
LAMBDA FREE LIGHT CHAINS: 0.95 mg/dL (ref 0.60–2.70)
TOTAL PROTEIN: 6.4 g/dL

## 2024-03-04 NOTE — Telephone Encounter (Signed)
 Message from Lauraine RAMAN sent at 03/04/2024  9:22 AM EDT    Summary: RE: New Appointment    Copied From CRM [phone].  Haik, Peri Kreft (Self) called to schedule an appointment.  Pt is scheduled for first available opening for injection follow up. Do you want to see her sooner?  Thanks.                Call History    Contact Date/Time Type Contact Identity Validated Phone/Fax By   03/04/2024 09:18 AM EDT Phone (Incoming) Foskett, Olam Browning (Self) Name, DOB, Phone, Address [phone] JOHNSIE) [place] Lauraine SAUNDERS     RE: New Appointment  Received: Today  Nicholaus Lint, APRN,NP-C  Simon Aaberg, RN  The November appt is fine. Looks like they wanted her to see neurology before making a decision on surgery so seeing her sooner doesn't really change the plan.          Previous Messages       ----- Message -----  From: Joshua Harder, RN  Sent: 03/04/2024   9:27 AM EDT  To: Lint Nicholaus, APRN,NP-C  Subject: FW: New Appointment                              Patient received a LESI yesterday when should we fu with her. Currently schedule 05/10/24.    Patient aware

## 2024-03-05 LAB — COPPER, SERUM: COPPER: 93 ug/dL (ref 70–175)

## 2024-03-05 LAB — METHYLMALONIC ACID (MMA), QUANTITATIVE, PLASMA: METHYLMALONIC ACID: 217 nmol/L (ref 55–335)

## 2024-03-05 LAB — ZINC, SERUM: ZINC: 64 ug/dL (ref 60–130)

## 2024-03-07 LAB — VITAMIN B1 (THIAMIN), WHOLE BLOOD: VITAMIN B1 (THIAMINE), BLOOD, LC/MS/MS: 158 nmol/L (ref 78–185)

## 2024-03-14 ENCOUNTER — Ambulatory Visit (INDEPENDENT_AMBULATORY_CARE_PROVIDER_SITE_OTHER): Payer: Self-pay | Admitting: Neurology

## 2024-03-17 ENCOUNTER — Ambulatory Visit (INDEPENDENT_AMBULATORY_CARE_PROVIDER_SITE_OTHER): Payer: Self-pay

## 2024-03-22 ENCOUNTER — Ambulatory Visit (INDEPENDENT_AMBULATORY_CARE_PROVIDER_SITE_OTHER): Payer: Self-pay

## 2024-03-28 NOTE — Telephone Encounter (Signed)
 Patient called to establish care with Dr.Campbell at [facility]. 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     Pt's phone number [phone]

## 2024-03-29 ENCOUNTER — Other Ambulatory Visit: Payer: Self-pay

## 2024-03-29 ENCOUNTER — Ambulatory Visit (INDEPENDENT_AMBULATORY_CARE_PROVIDER_SITE_OTHER)

## 2024-03-29 DIAGNOSIS — M542 Cervicalgia: Secondary | ICD-10-CM

## 2024-03-29 DIAGNOSIS — M25512 Pain in left shoulder: Secondary | ICD-10-CM

## 2024-03-29 DIAGNOSIS — M546 Pain in thoracic spine: Secondary | ICD-10-CM

## 2024-03-29 DIAGNOSIS — M25511 Pain in right shoulder: Secondary | ICD-10-CM

## 2024-03-29 NOTE — Progress Notes (Signed)
 [facility], [facility]  [address] [name]  [place] [address]      Name: Courtney [name] MRN:  [id number]   Date: 03/29/2024 DOB:  Mar 28, 1966 (58 y.o.)      Subjective:   Neck Pain, Shoulder Pain, Back Pain (upper), and Arm Pain (Left, upper)       Objective :  Tension and tightness in SCM's, occipitals, deltoids, traps, Left bicep, Tricep    Assessment:  Deep Tissue, Myofascial Release and Stretching therapies performed today  Pt is still tense, tight and tender through neck, deltoids and traps. Left upper arm is extremely tight today.  Myofascial Release was performed on left upper arm. Will need again next appt.  Tension and tightness was slightly reduced in SCM's, scalenes, occipitals, sub-occipitals, levators, deltoids, scapulas,traps and rhomboids, left bicep and tricep  Pt was seen for a 60 minute session    Plan:  Drink plenty of water . Biofreeze, ice and/or warm bath in epsom salt as needed for soreness.   Stretch multiple times per day, focusing on neck, shoulders, traps, left upper arm    Teisha Trowbridge, LMT

## 2024-04-05 ENCOUNTER — Ambulatory Visit (INDEPENDENT_AMBULATORY_CARE_PROVIDER_SITE_OTHER)

## 2024-04-08 ENCOUNTER — Ambulatory Visit (INDEPENDENT_AMBULATORY_CARE_PROVIDER_SITE_OTHER): Payer: Self-pay

## 2024-04-18 ENCOUNTER — Ambulatory Visit (INDEPENDENT_AMBULATORY_CARE_PROVIDER_SITE_OTHER)

## 2024-04-18 ENCOUNTER — Ambulatory Visit (HOSPITAL_BASED_OUTPATIENT_CLINIC_OR_DEPARTMENT_OTHER): Payer: Self-pay

## 2024-04-18 DIAGNOSIS — I728 Aneurysm of other specified arteries: Secondary | ICD-10-CM

## 2024-04-18 NOTE — Telephone Encounter (Signed)
 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 Elaine. She was last seen by him at [facility] in 09/2020. He reviewed the most recent CT images and ordered a CTA abd/pelvis with OV. Pt is agreeable to plan.     She denies IV contrast allergy and denies ever having kidney issues/poor renal labs.     Larraine, could you please schedule her CTA and OV with Dr. Elaine same day, middle/end of December?     Thank you!  Lonell RN

## 2024-04-18 NOTE — Telephone Encounter (Signed)
 Pending   Case ID# [id number] was submitted on 04-18-2024

## 2024-04-20 NOTE — Telephone Encounter (Signed)
 CTA abd/pelvis and OV with Dr.Campbell scheduled. Patient notified of appointment date/time and location. Instructions given,She verbalized understanding. Appointment reminder mailed.     Larraine Holts, MA  04/20/2024 16:40

## 2024-05-10 ENCOUNTER — Ambulatory Visit (INDEPENDENT_AMBULATORY_CARE_PROVIDER_SITE_OTHER): Payer: Self-pay | Admitting: Orthopaedic Surgery of the Spine

## 2024-05-19 ENCOUNTER — Ambulatory Visit (INDEPENDENT_AMBULATORY_CARE_PROVIDER_SITE_OTHER)

## 2024-06-15 ENCOUNTER — Ambulatory Visit (INDEPENDENT_AMBULATORY_CARE_PROVIDER_SITE_OTHER): Payer: Self-pay

## 2024-06-15 ENCOUNTER — Ambulatory Visit (HOSPITAL_BASED_OUTPATIENT_CLINIC_OR_DEPARTMENT_OTHER): Payer: Self-pay | Admitting: VASCULAR SURGERY

## 2024-06-16 ENCOUNTER — Ambulatory Visit (INDEPENDENT_AMBULATORY_CARE_PROVIDER_SITE_OTHER): Payer: Self-pay

## 2024-06-27 ENCOUNTER — Encounter (HOSPITAL_BASED_OUTPATIENT_CLINIC_OR_DEPARTMENT_OTHER): Payer: Self-pay | Admitting: VASCULAR SURGERY

## 2024-06-27 DIAGNOSIS — G35D Multiple sclerosis, unspecified: Secondary | ICD-10-CM | POA: Insufficient documentation

## 2024-06-27 DIAGNOSIS — D649 Anemia, unspecified: Secondary | ICD-10-CM | POA: Insufficient documentation

## 2024-06-27 DIAGNOSIS — I209 Angina pectoris, unspecified: Secondary | ICD-10-CM

## 2024-06-27 DIAGNOSIS — G43909 Migraine, unspecified, not intractable, without status migrainosus: Secondary | ICD-10-CM | POA: Insufficient documentation

## 2024-06-27 DIAGNOSIS — R209 Unspecified disturbances of skin sensation: Secondary | ICD-10-CM | POA: Insufficient documentation

## 2024-06-27 DIAGNOSIS — M797 Fibromyalgia: Secondary | ICD-10-CM | POA: Insufficient documentation

## 2024-06-27 DIAGNOSIS — E119 Type 2 diabetes mellitus without complications: Secondary | ICD-10-CM | POA: Insufficient documentation

## 2024-06-27 DIAGNOSIS — I639 Cerebral infarction, unspecified: Secondary | ICD-10-CM | POA: Insufficient documentation

## 2024-06-27 DIAGNOSIS — I729 Aneurysm of unspecified site: Secondary | ICD-10-CM | POA: Insufficient documentation

## 2024-06-27 DIAGNOSIS — R0683 Snoring: Secondary | ICD-10-CM | POA: Insufficient documentation

## 2024-06-27 DIAGNOSIS — R0600 Dyspnea, unspecified: Secondary | ICD-10-CM | POA: Insufficient documentation

## 2024-06-27 DIAGNOSIS — B029 Zoster without complications: Secondary | ICD-10-CM | POA: Insufficient documentation

## 2024-06-27 DIAGNOSIS — G9009 Other idiopathic peripheral autonomic neuropathy: Secondary | ICD-10-CM | POA: Insufficient documentation

## 2024-06-27 HISTORY — DX: Angina pectoris, unspecified: I20.9

## 2024-07-05 ENCOUNTER — Other Ambulatory Visit: Payer: Self-pay

## 2024-07-06 ENCOUNTER — Ambulatory Visit (HOSPITAL_BASED_OUTPATIENT_CLINIC_OR_DEPARTMENT_OTHER): Payer: Self-pay | Admitting: VASCULAR SURGERY

## 2024-07-06 ENCOUNTER — Ambulatory Visit
Admission: RE | Admit: 2024-07-06 | Discharge: 2024-07-06 | Disposition: A | Discharge status: Home / Self Care | Payer: Self-pay | Source: Ambulatory Visit | Attending: VASCULAR SURGERY | Admitting: VASCULAR SURGERY

## 2024-07-06 ENCOUNTER — Ambulatory Visit (HOSPITAL_BASED_OUTPATIENT_CLINIC_OR_DEPARTMENT_OTHER): Payer: Self-pay

## 2024-07-06 DIAGNOSIS — I728 Aneurysm of other specified arteries: Secondary | ICD-10-CM | POA: Insufficient documentation

## 2024-07-06 MED ORDER — IOPAMIDOL 370 MG IODINE/ML (76 %) INTRAVENOUS SOLUTION
100.0000 mL | INTRAVENOUS | Status: AC
  Administered 2024-07-06: 100 mL via INTRAVENOUS

## 2024-07-06 MED ORDER — DIPHENHYDRAMINE 25 MG CAPSULE: 50.0000 mg | ORAL_CAPSULE | Freq: Four times a day (QID) | ORAL | Status: DC | PRN

## 2024-07-06 NOTE — Telephone Encounter (Addendum)
 Shelly from CT called over and said that pt is experiencing a IV contrast reaction. Hives, itching, and eyes are swollen. They are monitoring her x1hr and have administered medications including Benadryl  per Radiologist. They will send her over after 1 hr if improved or to ER if not. Let Dr. Elaine know and he said he can review the images and have someone do a telehealth visit tomorrow. Shelly to notify pt.    Olam Browning Winsor hs not experienced a reaction to IV contrast before, IV contrast added to allergies.     Simren Popson, RN

## 2024-07-07 ENCOUNTER — Ambulatory Visit

## 2024-07-07 ENCOUNTER — Encounter (HOSPITAL_BASED_OUTPATIENT_CLINIC_OR_DEPARTMENT_OTHER): Payer: Self-pay

## 2024-07-07 ENCOUNTER — Telehealth (HOSPITAL_BASED_OUTPATIENT_CLINIC_OR_DEPARTMENT_OTHER): Payer: Self-pay

## 2024-07-07 ENCOUNTER — Other Ambulatory Visit: Payer: Self-pay

## 2024-07-07 DIAGNOSIS — I728 Aneurysm of other specified arteries: Secondary | ICD-10-CM

## 2024-07-07 MED ORDER — DIPHENHYDRAMINE 50 MG CAPSULE: ORAL_CAPSULE | ORAL | 0 refills | Status: AC

## 2024-07-07 MED ORDER — PREDNISONE 50 MG TABLET: ORAL_TABLET | ORAL | 0 refills | Status: AC

## 2024-07-07 NOTE — Telephone Encounter (Addendum)
 CTA abdomen pelvis order received. Will obtain authorization and schedule in 2 years. (06/2026)     Larraine Holts, MA  07/07/2024 14:29    ----- Message from Sherryle Ready, PA-C sent at 07/07/2024  1:33 PM EST -----  Patient will need CTA abdomen pelvis in 2 years. She has a contrast allergy and premedication orders placed. Thanks

## 2024-07-07 NOTE — Progress Notes (Signed)
 [facility]GERY[facility]  [address]  [place] [address]  Operated by [facility]  Telephone Visit    Name:  Courtney [name] MRN: [id number]   Date:  07/07/2024 DOB: Jun 03, 1966 (59 y.o.)          The patient/family initiated a request for telephone service.  Verbal consent for this service was obtained from the patient/family.  Reason for audio only:Patient preference    Last office visit in this department: Visit date not found      Reason for call: splenic artery aneurysm  Call notes:  Patient with known splenic artery aneurysm.  She had CTA abdomen and pelvis performed yesterday which revealed stable aneurysm size at 1.4 x 2 cm.  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  Patient is asymptomatic.  Images reviewed by Dr. Elaine who recommends repeat CTA abdomen and pelvis in 2 years.  Patient with contrast allergy and we will premedicate with steroids and Benadryl .  Patient voices understanding and will follow up in 2 years after repeat CT scan.  Stressed importance of blood pressure control.      ICD-10-CM    1. Splenic artery aneurysm (CMS HCC)  I72.8           LOS Determination: Medical Decision Making- Direct audio communication with patient was 24 minutes    Sherryle Ready, PA-C

## 2024-07-26 ENCOUNTER — Ambulatory Visit (INDEPENDENT_AMBULATORY_CARE_PROVIDER_SITE_OTHER): Payer: Self-pay | Admitting: Orthopaedic Surgery of the Spine

## 2024-07-26 ENCOUNTER — Ambulatory Visit (INDEPENDENT_AMBULATORY_CARE_PROVIDER_SITE_OTHER)

## 2024-08-30 ENCOUNTER — Ambulatory Visit (INDEPENDENT_AMBULATORY_CARE_PROVIDER_SITE_OTHER): Payer: Self-pay

## 2024-08-30 ENCOUNTER — Ambulatory Visit (INDEPENDENT_AMBULATORY_CARE_PROVIDER_SITE_OTHER): Admitting: Orthopaedic Surgery of the Spine
